# Patient Record
Sex: Male | Born: 1935 | Race: White | Hispanic: No | Marital: Married | Smoking: Never smoker
Health system: Southern US, Community
[De-identification: ages and names within clinical notes are randomized; demographics above are authoritative.]

## PROBLEM LIST (undated history)

## (undated) DIAGNOSIS — E785 Hyperlipidemia, unspecified: Secondary | ICD-10-CM

## (undated) DIAGNOSIS — R600 Localized edema: Secondary | ICD-10-CM

## (undated) DIAGNOSIS — C61 Malignant neoplasm of prostate: Secondary | ICD-10-CM

## (undated) DIAGNOSIS — R32 Unspecified urinary incontinence: Secondary | ICD-10-CM

## (undated) DIAGNOSIS — Z85828 Personal history of other malignant neoplasm of skin: Secondary | ICD-10-CM

## (undated) DIAGNOSIS — R0600 Dyspnea, unspecified: Secondary | ICD-10-CM

## (undated) DIAGNOSIS — Z8719 Personal history of other diseases of the digestive system: Secondary | ICD-10-CM

## (undated) DIAGNOSIS — N210 Calculus in bladder: Secondary | ICD-10-CM

## (undated) DIAGNOSIS — E119 Type 2 diabetes mellitus without complications: Secondary | ICD-10-CM

## (undated) DIAGNOSIS — I499 Cardiac arrhythmia, unspecified: Secondary | ICD-10-CM

## (undated) DIAGNOSIS — N400 Enlarged prostate without lower urinary tract symptoms: Secondary | ICD-10-CM

## (undated) DIAGNOSIS — Z87442 Personal history of urinary calculi: Secondary | ICD-10-CM

## (undated) DIAGNOSIS — I2510 Atherosclerotic heart disease of native coronary artery without angina pectoris: Secondary | ICD-10-CM

## (undated) DIAGNOSIS — Z9889 Other specified postprocedural states: Secondary | ICD-10-CM

## (undated) DIAGNOSIS — I252 Old myocardial infarction: Secondary | ICD-10-CM

## (undated) DIAGNOSIS — D649 Anemia, unspecified: Secondary | ICD-10-CM

## (undated) DIAGNOSIS — M51369 Other intervertebral disc degeneration, lumbar region without mention of lumbar back pain or lower extremity pain: Secondary | ICD-10-CM

## (undated) DIAGNOSIS — M5136 Other intervertebral disc degeneration, lumbar region: Secondary | ICD-10-CM

## (undated) DIAGNOSIS — K219 Gastro-esophageal reflux disease without esophagitis: Secondary | ICD-10-CM

## (undated) DIAGNOSIS — Z860101 Personal history of adenomatous and serrated colon polyps: Secondary | ICD-10-CM

## (undated) DIAGNOSIS — I10 Essential (primary) hypertension: Secondary | ICD-10-CM

## (undated) DIAGNOSIS — Z86010 Personal history of colonic polyps: Secondary | ICD-10-CM

## (undated) DIAGNOSIS — N189 Chronic kidney disease, unspecified: Secondary | ICD-10-CM

## (undated) DIAGNOSIS — I480 Paroxysmal atrial fibrillation: Secondary | ICD-10-CM

## (undated) DIAGNOSIS — I5032 Chronic diastolic (congestive) heart failure: Secondary | ICD-10-CM

## (undated) DIAGNOSIS — N35919 Unspecified urethral stricture, male, unspecified site: Secondary | ICD-10-CM

## (undated) DIAGNOSIS — Z85038 Personal history of other malignant neoplasm of large intestine: Secondary | ICD-10-CM

## (undated) HISTORY — PX: BLEPHAROPLASTY: SUR158

## (undated) HISTORY — PX: CORONARY ANGIOPLASTY: SHX604

---

## 2003-10-11 ENCOUNTER — Ambulatory Visit: Payer: Self-pay | Admitting: Unknown Physician Specialty

## 2003-10-14 ENCOUNTER — Ambulatory Visit: Payer: Self-pay | Admitting: Unknown Physician Specialty

## 2004-03-14 ENCOUNTER — Emergency Department: Payer: Self-pay | Admitting: Emergency Medicine

## 2004-09-02 ENCOUNTER — Ambulatory Visit: Payer: Self-pay | Admitting: Internal Medicine

## 2004-09-08 ENCOUNTER — Ambulatory Visit: Payer: Self-pay | Admitting: Internal Medicine

## 2006-09-04 IMAGING — RF DG BE W/ AIR HIGH DENSITY
1 series · 1 of 1 positions shown · non-contrast
Comparison: none

REASON FOR EXAM: Incomplete colonscopy [DATE]
COMMENTS:

[Series 1: run · 1 of 1 slices shown]
[im 1/1]
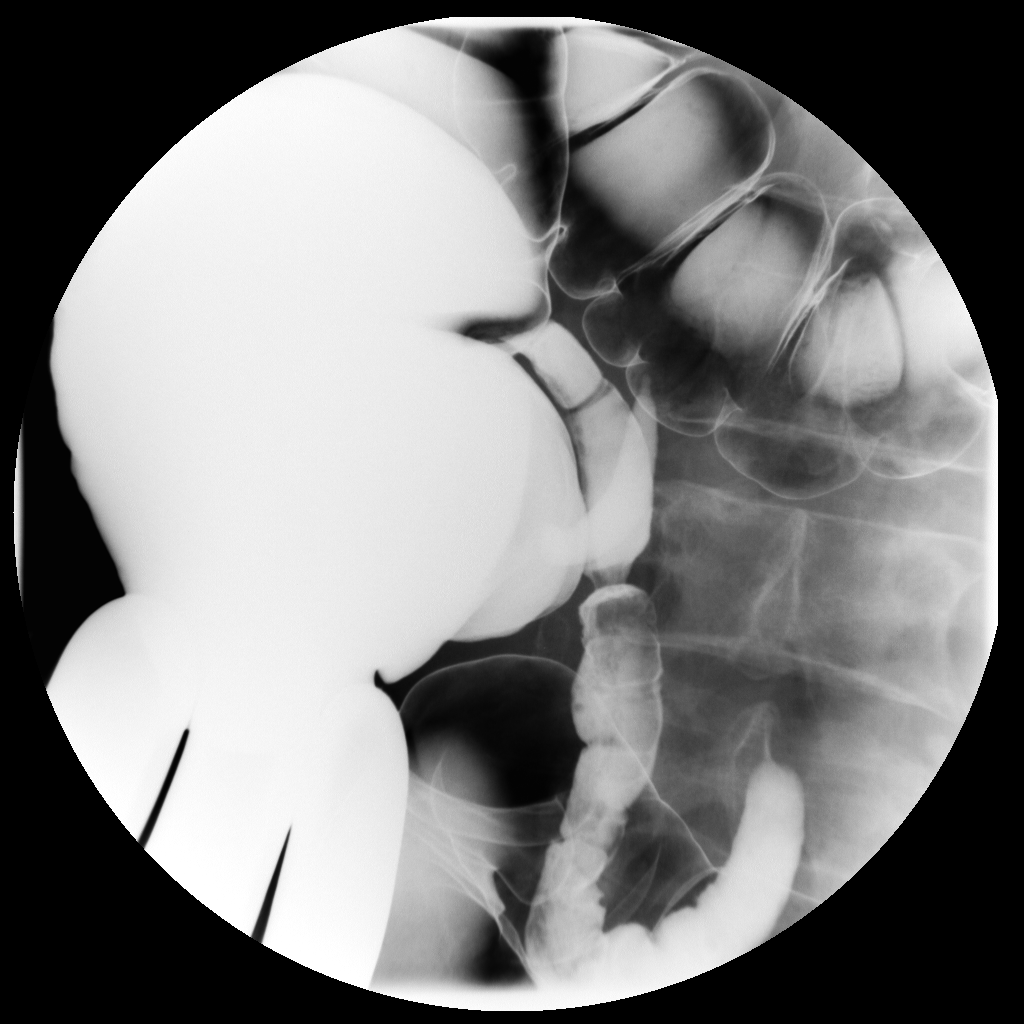

[1 of 1 positions shown; findings below may reference images not displayed]

PROCEDURE:     FL  - FL BARIUM ENEMA WITH AIR COLON  - October 14, 2003 [DATE]

RESULT:     Barium and air were introduced in a retrograde fashion and
flowed freely from rectum to cecum.  No constricting lesions are identified.
 No diverticula are noted.  There is some small amount of retained fecal
content in the transverse colon.

Barium filled the appendix and barium and air refluxed into the terminal
ileum, which appears within normal limits.
IMPRESSION: No significant abnormalities noted on the air contrast barium enema.

## 2006-10-17 ENCOUNTER — Ambulatory Visit: Payer: Self-pay | Admitting: Unknown Physician Specialty

## 2007-01-13 ENCOUNTER — Ambulatory Visit: Payer: Self-pay | Admitting: Unknown Physician Specialty

## 2010-09-28 ENCOUNTER — Ambulatory Visit: Payer: Self-pay | Admitting: Unknown Physician Specialty

## 2011-08-18 ENCOUNTER — Ambulatory Visit: Payer: Self-pay | Admitting: Internal Medicine

## 2011-08-25 ENCOUNTER — Ambulatory Visit: Payer: Self-pay | Admitting: Urology

## 2011-08-26 ENCOUNTER — Inpatient Hospital Stay: Payer: Self-pay | Admitting: Urology

## 2011-08-27 LAB — BASIC METABOLIC PANEL
Anion Gap: 10 (ref 7–16)
Calcium, Total: 8.7 mg/dL (ref 8.5–10.1)
Chloride: 104 mmol/L (ref 98–107)
Co2: 26 mmol/L (ref 21–32)
EGFR (Non-African Amer.): 33 — ABNORMAL LOW
Osmolality: 288 (ref 275–301)
Potassium: 4.6 mmol/L (ref 3.5–5.1)

## 2011-08-27 LAB — PLATELET COUNT: Platelet: 250 10*3/uL (ref 150–440)

## 2011-08-27 LAB — PROTIME-INR: Prothrombin Time: 14.3 secs (ref 11.5–14.7)

## 2011-08-27 LAB — APTT: Activated PTT: 33.9 secs (ref 23.6–35.9)

## 2012-04-13 ENCOUNTER — Ambulatory Visit: Payer: Self-pay | Admitting: Physical Medicine and Rehabilitation

## 2012-09-25 ENCOUNTER — Ambulatory Visit: Payer: Self-pay | Admitting: Internal Medicine

## 2012-11-02 ENCOUNTER — Ambulatory Visit: Payer: Self-pay | Admitting: Unknown Physician Specialty

## 2012-11-08 HISTORY — PX: PARTIAL KNEE ARTHROPLASTY: SHX2174

## 2013-08-04 HISTORY — PX: CYSTOSCOPY WITH RETROGRADE PYELOGRAM, URETEROSCOPY AND STENT PLACEMENT: SHX5789

## 2013-09-18 ENCOUNTER — Ambulatory Visit: Payer: Self-pay

## 2013-10-22 HISTORY — PX: TRANSTHORACIC ECHOCARDIOGRAM: SHX275

## 2013-12-21 ENCOUNTER — Ambulatory Visit: Payer: Self-pay | Admitting: Unknown Physician Specialty

## 2014-04-23 NOTE — Op Note (Signed)
PATIENT NAME:  Carlos Obrien, Carlos Obrien MR#:  [id number] DATE OF BIRTH:  06-23-1934  DATE OF PROCEDURE:  08/26/2011  PREOPERATIVE DIAGNOSIS: Left distal ureteral calculus.   POSTOPERATIVE DIAGNOSIS: Left distal ureteral calculus.  PROCEDURE: Cystoscopy.   SURGEON: John Giovanni, M.D.   ASSISTANT: None.   ANESTHESIA: General.   INDICATIONS: This is a 80 year old male who presented last week with a three-day history of left renal colic. Prior emergency department visit was remarkable for stone protocol CT scan showing a 2 mm left distal ureteral calculus with mild to moderate hydronephrosis/hydroureter. He has been on tamsulosin and pain medication and has been unable to pass the stone. He took a laxative yesterday with good results and has been asymptomatic since that time but not aware of passing a stone. They elected to proceed with ureteroscopy.   DESCRIPTION OF PROCEDURE: The patient was taken to the cystoscopy room where a general anesthetic was administered. He was placed in the low lithotomy position and his external genitalia were prepped and draped in the usual fashion. Time out was performed per protocol. A 21 French cystoscope with 30 degree lens was lubricated and passed under direct vision. There was a wide caliber bulb stricture present which the 21 [place] scope was able to negotiate. The left hemi-trigone was remarkable for marked edema and a definite left ureteral orifice was not visualized. The right ureteral orifice was normal in appearance with clear efflux. No bladder mucosal lesions were identified. There was mild prostatic enlargement present. A 5 French open-ended ureteral catheter was placed in the working channel of the cystoscope. A 0.035 Glidewire was placed through the catheter. On closely inspecting the left hemi-trigone several areas were probed with a wire, however, the ureteral orifice could not be located. The orifice was also examined with a 70 degree lens. No efflux was seen.  He was given 1 amp of indigo carmine. There was marked effluxing from the right orifice, but no efflux was seen from the left side with indigo carmine. The cystoscope was removed. A 6 French semirigid ureteroscope was passed under direct vision. Areas of the left hemi-trigone were again inspected with the ureteroscope and probed with a wire, although no definite orifice could be located. Greater than 60 minutes was spent attempting to locate the orifice and at that point it was elected to abandon the procedure. The bladder was emptied and the cystoscope was removed. The case was discussed with interventional radiology and he is slated to have a percutaneous nephrostomy placed in the morning.  ____________________________ Ronda Fairly. Bernardo Heater, MD scs:slb D: 08/26/2011 11:04:38 ET T: 08/26/2011 11:37:17 ET JOB#: [id number]  cc: Nicki Reaper C. Bernardo Heater, MD, <Dictator> Abbie Sons MD ELECTRONICALLY SIGNED 08/30/2011 21:16

## 2014-04-29 LAB — SURGICAL PATHOLOGY

## 2014-10-02 ENCOUNTER — Ambulatory Visit (INDEPENDENT_AMBULATORY_CARE_PROVIDER_SITE_OTHER): Payer: Medicare Other

## 2014-10-02 ENCOUNTER — Ambulatory Visit (INDEPENDENT_AMBULATORY_CARE_PROVIDER_SITE_OTHER): Payer: Medicare Other | Admitting: Podiatry

## 2014-10-02 VITALS — BP 120/75 | HR 93 | Resp 16

## 2014-10-02 DIAGNOSIS — M722 Plantar fascial fibromatosis: Secondary | ICD-10-CM

## 2014-10-02 MED ORDER — TRIAMCINOLONE ACETONIDE 10 MG/ML IJ SUSP
10.0000 mg | Freq: Once | INTRAMUSCULAR | Status: AC
  Administered 2014-10-02: 10 mg

## 2014-10-02 NOTE — Progress Notes (Signed)
Subjective:     Patient ID: Carlos Obrien, male   DOB: 09/02/1934, 80 y.o.   MRN: [id number]  HPI patient states I've had a lot of pain on the bottom of my left heel and it just started occurring several weeks ago and I had problems with my right heel years ago   Review of Systems  All other systems reviewed and are negative.      Objective:   Physical Exam  Constitutional: He is oriented to person, place, and time.  Cardiovascular: Intact distal pulses.   Musculoskeletal: Normal range of motion.  Neurological: He is oriented to person, place, and time.  Skin: Skin is warm and dry.  Nursing note and vitals reviewed.  neurovascular status intact muscle strength adequate range of motion within normal limits with patient having exquisite discomfort at the insertion of the plantar fascia into the left calcaneus. Moderate depression of the arch is noted and patient is noted to be well oriented 3 and has good digital perfusion     Assessment:     Plantar fasciitis left acute in nature    Plan:     H&P and x-ray reviewed with patient. Injected the plantar fascial left 3 mg Kenalog 5 mg Xylocaine and dispensed fascial brace with instructions on usage. Reappoint to recheck in 2 weeks

## 2014-10-02 NOTE — Progress Notes (Signed)
   Subjective:    Patient ID: Carlos Obrien, male    DOB: July 16, 1934, 80 y.o.   MRN: [id number]  HPI Pt  Presents with pain on the bottom of his left heel lasting 2 weeks and worsening. No therapies done at this time   Review of Systems  All other systems reviewed and are negative.      Objective:   Physical Exam        Assessment & Plan:

## 2014-10-02 NOTE — Patient Instructions (Signed)

## 2014-10-09 ENCOUNTER — Ambulatory Visit (INDEPENDENT_AMBULATORY_CARE_PROVIDER_SITE_OTHER): Payer: Medicare Other | Admitting: Podiatry

## 2014-10-09 ENCOUNTER — Ambulatory Visit: Payer: Medicare Other | Admitting: Podiatry

## 2014-10-09 DIAGNOSIS — M722 Plantar fascial fibromatosis: Secondary | ICD-10-CM | POA: Diagnosis not present

## 2014-10-09 NOTE — Progress Notes (Signed)
Subjective:     Patient ID: Carlos Obrien, male   DOB: 17-Nov-1934, 80 y.o.   MRN: [id number]  HPI patient presents stating my heel is improving but still sore and I just wanted to make sure that I'm able to resume walking   Review of Systems     Objective:   Physical Exam Neurovascular status intact muscle strength adequate with diminished discomfort plantar aspect left heel insertional point tendon into the calcaneus    Assessment:     Plantar fasciitis left improving but still present upon deep palpation with moderate edema    Plan:     Reviewed at great length treatment options including continued injection therapy anti-inflammatory physical therapy and supportive shoe gear. Patient will be seen back for [place] to recheck

## 2014-10-16 ENCOUNTER — Encounter: Payer: Self-pay | Admitting: Podiatry

## 2014-10-16 ENCOUNTER — Ambulatory Visit (INDEPENDENT_AMBULATORY_CARE_PROVIDER_SITE_OTHER): Payer: Medicare Other | Admitting: Podiatry

## 2014-10-16 VITALS — BP 123/74 | HR 103 | Resp 18

## 2014-10-16 DIAGNOSIS — M722 Plantar fascial fibromatosis: Secondary | ICD-10-CM | POA: Diagnosis not present

## 2014-10-16 NOTE — Progress Notes (Signed)
He presents today with worsening foot pain to his left heel. He states that he saw Dr. in our office in [place] who gave him an injection to his left heel. He is supposed to follow-up next week. He states this seems to be getting worse rather than better.  Objective: Vital signs are stable he is alert and oriented 3. Pulses are palpable. Pain on palpation medially for interval of the left hip.  Assessment: Plantar fasciitis left heel.  Plan: Reinjected with 20 mg of Kenalog left heel. Placed him in a plantar fascial brace and night splint encouraged him to stay in his tissues throughout the day and I will follow-up with him in 1 month.  Roselind Messier DPM

## 2014-10-21 ENCOUNTER — Ambulatory Visit: Payer: Medicare Other | Admitting: Podiatry

## 2014-10-22 ENCOUNTER — Telehealth: Payer: Self-pay | Admitting: *Deleted

## 2014-10-22 NOTE — Telephone Encounter (Signed)
Pt's wife, Shauna Hugh states pt puts on night splint at night when in bed with the ice pack, once the ice pack melts pt takes the night splint off.  I instructed Diane, the night splint was to be worn with the ice packs when pt was resting, 3-4 times a day for 10-15 minutes when seated, and to wear the night splint without the ice packs when sleeping.  Pt must have the foot protected from the ice with a fabric barrier. I contacted the pt for status up date on the left heel pain.  Pt states he still has pain.  I explained the wearing of the ice pack and night splint during the day and the night splint at night to bed, and how the ice worked on the inflammation, swelling and pain and how the night splint at bed time helped to train the plantar fascia to stay stretched out and more flexible.  Pt agreed.

## 2014-10-24 ENCOUNTER — Telehealth: Payer: Self-pay | Admitting: Podiatry

## 2014-10-24 ENCOUNTER — Telehealth: Payer: Self-pay

## 2014-10-24 NOTE — Telephone Encounter (Signed)
DR. Milinda Pointer PT. CAME IN [place] OFFICE TO PICK UP AIR FRACTURE WALKER FOR THE LT FOOT. 10-24-2014

## 2014-10-24 NOTE — Telephone Encounter (Signed)
Pt wife, Shauna Hugh called stating he was in severe pain when trying to walk on his right foot. Per Dr Milinda Pointer, pt can try using air fracture walker to offload foot and allow to rest. Advised to continue with ice, rest and elevation. Keep f/u appt

## 2014-10-25 ENCOUNTER — Ambulatory Visit: Payer: Medicare Other | Admitting: Sports Medicine

## 2014-11-13 ENCOUNTER — Encounter: Payer: Self-pay | Admitting: Podiatry

## 2014-11-13 ENCOUNTER — Ambulatory Visit (INDEPENDENT_AMBULATORY_CARE_PROVIDER_SITE_OTHER): Payer: Medicare Other | Admitting: Podiatry

## 2014-11-13 VITALS — BP 124/82 | HR 91 | Resp 18

## 2014-11-13 DIAGNOSIS — M722 Plantar fascial fibromatosis: Secondary | ICD-10-CM

## 2014-11-13 NOTE — Progress Notes (Signed)
He presents today for follow-up of his plantar fasciitis. He states that his left heel is doing much better but it is not well yet. He states that he has started wearing a pair of his ulnar orthotics which makes that he'll feel much better.  Objective: Vital signs are stable alert and oriented 3. Pulses are strongly palpable. He has pain on palpation medial calcaneal tubercle of the left heel but much decreased from previous evaluation. His orthotics are from 1989. They're still in good shape and there not broken down. It appeared to fit his arch and his rear foot remarkably well.  Assessment: Plantar fasciitis left heel. Doing much better.  Plan: At this point I injected the first dose of dehydrated alcohol to his left heel. And I urged him to continue the use of his orthotics and shoes. He will continue all other conservative therapies night splints for plantar fascial braces I will follow-up with him in 1 month.

## 2014-12-17 ENCOUNTER — Other Ambulatory Visit: Payer: Self-pay | Admitting: Urology

## 2014-12-25 ENCOUNTER — Ambulatory Visit: Payer: Medicare Other | Admitting: Podiatry

## 2015-01-01 ENCOUNTER — Encounter (HOSPITAL_BASED_OUTPATIENT_CLINIC_OR_DEPARTMENT_OTHER): Payer: Self-pay | Admitting: *Deleted

## 2015-01-01 NOTE — Progress Notes (Signed)
NPO AFTER MN.  ARRIVE AT [id number].  NEEDS ISTAT AND EKG.  WILL TAKE IMDUR, ATENOLOL, AND PRILOSEC AM DOS W/ SIPS OF WATER.

## 2015-01-02 ENCOUNTER — Encounter (HOSPITAL_BASED_OUTPATIENT_CLINIC_OR_DEPARTMENT_OTHER): Payer: Self-pay | Admitting: *Deleted

## 2015-01-08 ENCOUNTER — Encounter (HOSPITAL_BASED_OUTPATIENT_CLINIC_OR_DEPARTMENT_OTHER): Payer: Self-pay

## 2015-01-08 ENCOUNTER — Encounter (HOSPITAL_BASED_OUTPATIENT_CLINIC_OR_DEPARTMENT_OTHER)
Admission: RE | Disposition: A | Discharge status: Home / Self Care | Payer: Self-pay | Source: Ambulatory Visit | Attending: Urology

## 2015-01-08 ENCOUNTER — Ambulatory Visit (HOSPITAL_BASED_OUTPATIENT_CLINIC_OR_DEPARTMENT_OTHER)
Admission: RE | Admit: 2015-01-08 | Discharge: 2015-01-08 | Disposition: A | Discharge status: Home / Self Care | Payer: Medicare Other | Source: Ambulatory Visit | Attending: Urology | Admitting: Urology

## 2015-01-08 ENCOUNTER — Ambulatory Visit (HOSPITAL_BASED_OUTPATIENT_CLINIC_OR_DEPARTMENT_OTHER): Payer: Medicare Other | Admitting: Anesthesiology

## 2015-01-08 DIAGNOSIS — I252 Old myocardial infarction: Secondary | ICD-10-CM | POA: Insufficient documentation

## 2015-01-08 DIAGNOSIS — N35912 Unspecified bulbous urethral stricture, male: Secondary | ICD-10-CM

## 2015-01-08 DIAGNOSIS — Z79899 Other long term (current) drug therapy: Secondary | ICD-10-CM | POA: Diagnosis not present

## 2015-01-08 DIAGNOSIS — E119 Type 2 diabetes mellitus without complications: Secondary | ICD-10-CM | POA: Diagnosis not present

## 2015-01-08 DIAGNOSIS — I4891 Unspecified atrial fibrillation: Secondary | ICD-10-CM | POA: Insufficient documentation

## 2015-01-08 DIAGNOSIS — I509 Heart failure, unspecified: Secondary | ICD-10-CM | POA: Insufficient documentation

## 2015-01-08 DIAGNOSIS — Z7982 Long term (current) use of aspirin: Secondary | ICD-10-CM | POA: Diagnosis not present

## 2015-01-08 DIAGNOSIS — Z8546 Personal history of malignant neoplasm of prostate: Secondary | ICD-10-CM | POA: Diagnosis not present

## 2015-01-08 DIAGNOSIS — Z6834 Body mass index (BMI) 34.0-34.9, adult: Secondary | ICD-10-CM | POA: Diagnosis not present

## 2015-01-08 DIAGNOSIS — N3941 Urge incontinence: Secondary | ICD-10-CM | POA: Diagnosis not present

## 2015-01-08 DIAGNOSIS — Z7901 Long term (current) use of anticoagulants: Secondary | ICD-10-CM | POA: Insufficient documentation

## 2015-01-08 DIAGNOSIS — N359 Urethral stricture, unspecified: Secondary | ICD-10-CM | POA: Diagnosis not present

## 2015-01-08 DIAGNOSIS — K449 Diaphragmatic hernia without obstruction or gangrene: Secondary | ICD-10-CM | POA: Diagnosis not present

## 2015-01-08 DIAGNOSIS — Z7984 Long term (current) use of oral hypoglycemic drugs: Secondary | ICD-10-CM | POA: Insufficient documentation

## 2015-01-08 DIAGNOSIS — N3281 Overactive bladder: Secondary | ICD-10-CM | POA: Diagnosis not present

## 2015-01-08 DIAGNOSIS — M1990 Unspecified osteoarthritis, unspecified site: Secondary | ICD-10-CM | POA: Insufficient documentation

## 2015-01-08 DIAGNOSIS — K219 Gastro-esophageal reflux disease without esophagitis: Secondary | ICD-10-CM | POA: Diagnosis not present

## 2015-01-08 DIAGNOSIS — I110 Hypertensive heart disease with heart failure: Secondary | ICD-10-CM | POA: Insufficient documentation

## 2015-01-08 DIAGNOSIS — N210 Calculus in bladder: Secondary | ICD-10-CM

## 2015-01-08 DIAGNOSIS — Z791 Long term (current) use of non-steroidal anti-inflammatories (NSAID): Secondary | ICD-10-CM | POA: Diagnosis not present

## 2015-01-08 HISTORY — DX: Chronic diastolic (congestive) heart failure: I50.32

## 2015-01-08 HISTORY — DX: Other specified postprocedural states: Z85.828

## 2015-01-08 HISTORY — DX: Benign prostatic hyperplasia without lower urinary tract symptoms: N40.0

## 2015-01-08 HISTORY — DX: Other intervertebral disc degeneration, lumbar region without mention of lumbar back pain or lower extremity pain: M51.369

## 2015-01-08 HISTORY — PX: CYSTOSCOPY WITH URETHRAL DILATATION: SHX5125

## 2015-01-08 HISTORY — DX: Hyperlipidemia, unspecified: E78.5

## 2015-01-08 HISTORY — DX: Gastro-esophageal reflux disease without esophagitis: K21.9

## 2015-01-08 HISTORY — DX: Old myocardial infarction: I25.2

## 2015-01-08 HISTORY — DX: Essential (primary) hypertension: I10

## 2015-01-08 HISTORY — DX: Personal history of colonic polyps: Z86.010

## 2015-01-08 HISTORY — DX: Chronic kidney disease, unspecified: N18.9

## 2015-01-08 HISTORY — DX: Malignant neoplasm of prostate: C61

## 2015-01-08 HISTORY — PX: CYSTOSCOPY WITH HOLMIUM LASER LITHOTRIPSY: SHX6639

## 2015-01-08 HISTORY — DX: Calculus in bladder: N21.0

## 2015-01-08 HISTORY — DX: Personal history of other diseases of the digestive system: Z87.19

## 2015-01-08 HISTORY — DX: Atherosclerotic heart disease of native coronary artery without angina pectoris: I25.10

## 2015-01-08 HISTORY — DX: Other specified postprocedural states: Z98.890

## 2015-01-08 HISTORY — DX: Paroxysmal atrial fibrillation: I48.0

## 2015-01-08 HISTORY — DX: Personal history of other malignant neoplasm of large intestine: Z85.038

## 2015-01-08 HISTORY — DX: Personal history of adenomatous and serrated colon polyps: Z86.0101

## 2015-01-08 HISTORY — DX: Unspecified urinary incontinence: R32

## 2015-01-08 HISTORY — DX: Unspecified urethral stricture, male, unspecified site: N35.919

## 2015-01-08 HISTORY — DX: Localized edema: R60.0

## 2015-01-08 HISTORY — DX: Personal history of urinary calculi: Z87.442

## 2015-01-08 HISTORY — DX: Other intervertebral disc degeneration, lumbar region: M51.36

## 2015-01-08 LAB — GLUCOSE, CAPILLARY: Glucose-Capillary: 149 mg/dL — ABNORMAL HIGH (ref 65–99)

## 2015-01-08 LAB — POCT I-STAT 4, (NA,K, GLUC, HGB,HCT)
GLUCOSE: 189 mg/dL — AB (ref 65–99)
HCT: 41 % (ref 39.0–52.0)
HEMOGLOBIN: 13.9 g/dL (ref 13.0–17.0)
POTASSIUM: 4.6 mmol/L (ref 3.5–5.1)
Sodium: 137 mmol/L (ref 135–145)

## 2015-01-08 SURGERY — CYSTOSCOPY, WITH HOLMIUM LASER LITHOTRIPSY
Anesthesia: General | Site: Bladder

## 2015-01-08 MED ORDER — ONDANSETRON HCL 4 MG/2ML IJ SOLN
INTRAMUSCULAR | Status: DC | PRN
  Administered 2015-01-08: 4 mg via INTRAVENOUS

## 2015-01-08 MED ORDER — CEFAZOLIN SODIUM-DEXTROSE 2-3 GM-% IV SOLR
2.0000 g | INTRAVENOUS | Status: AC
  Administered 2015-01-08: 2 g via INTRAVENOUS
  Filled 2015-01-08: qty 50

## 2015-01-08 MED ORDER — OXYCODONE HCL 5 MG/5ML PO SOLN
5.0000 mg | Freq: Once | ORAL | Status: DC | PRN
  Filled 2015-01-08: qty 5

## 2015-01-08 MED ORDER — OXYCODONE HCL 5 MG PO TABS
5.0000 mg | ORAL_TABLET | Freq: Once | ORAL | Status: DC | PRN
  Filled 2015-01-08: qty 1

## 2015-01-08 MED ORDER — LACTATED RINGERS IV SOLN
INTRAVENOUS | Status: DC
  Filled 2015-01-08: qty 1000

## 2015-01-08 MED ORDER — FENTANYL CITRATE (PF) 100 MCG/2ML IJ SOLN
INTRAMUSCULAR | Status: DC | PRN
  Administered 2015-01-08 (×2): 50 ug via INTRAVENOUS

## 2015-01-08 MED ORDER — DEXAMETHASONE SODIUM PHOSPHATE 4 MG/ML IJ SOLN
INTRAMUSCULAR | Status: DC | PRN
  Administered 2015-01-08: 10 mg via INTRAVENOUS

## 2015-01-08 MED ORDER — LIDOCAINE HCL (CARDIAC) 20 MG/ML IV SOLN
INTRAVENOUS | Status: AC
  Filled 2015-01-08: qty 5

## 2015-01-08 MED ORDER — FENTANYL CITRATE (PF) 100 MCG/2ML IJ SOLN
INTRAMUSCULAR | Status: AC
  Filled 2015-01-08: qty 2

## 2015-01-08 MED ORDER — CEFAZOLIN SODIUM-DEXTROSE 2-3 GM-% IV SOLR
INTRAVENOUS | Status: AC
  Filled 2015-01-08: qty 50

## 2015-01-08 MED ORDER — MIDAZOLAM HCL 5 MG/5ML IJ SOLN
INTRAMUSCULAR | Status: DC | PRN
  Administered 2015-01-08: 1 mg via INTRAVENOUS

## 2015-01-08 MED ORDER — LIDOCAINE HCL (CARDIAC) 20 MG/ML IV SOLN
INTRAVENOUS | Status: DC | PRN
  Administered 2015-01-08: 100 mg via INTRAVENOUS

## 2015-01-08 MED ORDER — LIDOCAINE HCL 2 % EX GEL
CUTANEOUS | Status: DC | PRN
  Administered 2015-01-08: 1

## 2015-01-08 MED ORDER — HYDROMORPHONE HCL 1 MG/ML IJ SOLN
0.2500 mg | INTRAMUSCULAR | Status: DC | PRN
  Filled 2015-01-08: qty 1

## 2015-01-08 MED ORDER — DEXAMETHASONE SODIUM PHOSPHATE 10 MG/ML IJ SOLN
INTRAMUSCULAR | Status: AC
  Filled 2015-01-08: qty 1

## 2015-01-08 MED ORDER — PROPOFOL 10 MG/ML IV BOLUS
INTRAVENOUS | Status: AC
  Filled 2015-01-08: qty 20

## 2015-01-08 MED ORDER — ONDANSETRON HCL 4 MG/2ML IJ SOLN
INTRAMUSCULAR | Status: AC
  Filled 2015-01-08: qty 2

## 2015-01-08 MED ORDER — PROPOFOL 10 MG/ML IV BOLUS
INTRAVENOUS | Status: DC | PRN
  Administered 2015-01-08: 150 mg via INTRAVENOUS
  Administered 2015-01-08: 50 mg via INTRAVENOUS

## 2015-01-08 MED ORDER — MIDAZOLAM HCL 2 MG/2ML IJ SOLN
INTRAMUSCULAR | Status: AC
  Filled 2015-01-08: qty 2

## 2015-01-08 MED ORDER — MEPERIDINE HCL 25 MG/ML IJ SOLN
6.2500 mg | INTRAMUSCULAR | Status: DC | PRN
  Filled 2015-01-08: qty 1

## 2015-01-08 MED ORDER — SODIUM CHLORIDE 0.9 % IR SOLN
Status: DC | PRN
  Administered 2015-01-08: 3000 mL
  Administered 2015-01-08: 1000 mL

## 2015-01-08 MED ORDER — SODIUM CHLORIDE 0.9 % IV SOLN
INTRAVENOUS | Status: DC
  Administered 2015-01-08: 09:00:00 via INTRAVENOUS
  Filled 2015-01-08: qty 1000

## 2015-01-08 MED ORDER — KETOROLAC TROMETHAMINE 30 MG/ML IJ SOLN
INTRAMUSCULAR | Status: AC
  Filled 2015-01-08: qty 1

## 2015-01-08 MED ORDER — NALOXONE HCL 0.4 MG/ML IJ SOLN
INTRAMUSCULAR | Status: AC
  Filled 2015-01-08: qty 1

## 2015-01-08 MED ORDER — IOHEXOL 350 MG/ML SOLN
INTRAVENOUS | Status: DC | PRN
  Administered 2015-01-08: 10 mL via URETHRAL

## 2015-01-08 SURGICAL SUPPLY — 43 items
BAG DRAIN URO-CYSTO SKYTR STRL (DRAIN) ×4 IMPLANT
BAG URINE DRAINAGE (UROLOGICAL SUPPLIES) IMPLANT
BAG URINE LEG 19OZ MD ST LTX (BAG) IMPLANT
BAG URINE LEG 500ML (DRAIN) IMPLANT
BALLN NEPHROSTOMY (BALLOONS) ×4
BALLOON NEPHROSTOMY (BALLOONS) ×2 IMPLANT
CATH FOLEY 2WAY SLVR  5CC 16FR (CATHETERS)
CATH FOLEY 2WAY SLVR  5CC 18FR (CATHETERS)
CATH FOLEY 2WAY SLVR  5CC 20FR (CATHETERS)
CATH FOLEY 2WAY SLVR  5CC 22FR (CATHETERS)
CATH FOLEY 2WAY SLVR 30CC 22FR (CATHETERS) IMPLANT
CATH FOLEY 2WAY SLVR 5CC 16FR (CATHETERS) IMPLANT
CATH FOLEY 2WAY SLVR 5CC 18FR (CATHETERS) IMPLANT
CATH FOLEY 2WAY SLVR 5CC 20FR (CATHETERS) IMPLANT
CATH FOLEY 2WAY SLVR 5CC 22FR (CATHETERS) IMPLANT
CLOTH BEACON ORANGE TIMEOUT ST (SAFETY) ×4 IMPLANT
ELECT BUTTON BIOP 24F 90D PLAS (MISCELLANEOUS) IMPLANT
ELECT REM PT RETURN 9FT ADLT (ELECTROSURGICAL) ×4
ELECTRODE REM PT RTRN 9FT ADLT (ELECTROSURGICAL) ×2 IMPLANT
EVACUATOR MICROVAS BLADDER (UROLOGICAL SUPPLIES) IMPLANT
GLOVE BIO SURGEON STRL SZ 6.5 (GLOVE) ×3 IMPLANT
GLOVE BIO SURGEON STRL SZ7.5 (GLOVE) ×4 IMPLANT
GLOVE BIO SURGEONS STRL SZ 6.5 (GLOVE) ×1
GLOVE INDICATOR 6.5 STRL GRN (GLOVE) ×8 IMPLANT
GOWN STRL REUS W/ TWL LRG LVL3 (GOWN DISPOSABLE) ×2 IMPLANT
GOWN STRL REUS W/ TWL XL LVL3 (GOWN DISPOSABLE) ×2 IMPLANT
GOWN STRL REUS W/TWL LRG LVL3 (GOWN DISPOSABLE) ×2
GOWN STRL REUS W/TWL XL LVL3 (GOWN DISPOSABLE) ×2
GUIDEWIRE STR DUAL SENSOR (WIRE) ×4 IMPLANT
HOLDER FOLEY CATH W/STRAP (MISCELLANEOUS) IMPLANT
KIT ROOM TURNOVER WOR (KITS) ×4 IMPLANT
MANIFOLD NEPTUNE II (INSTRUMENTS) ×4 IMPLANT
NDL SAFETY ECLIPSE 18X1.5 (NEEDLE) IMPLANT
NEEDLE HYPO 18GX1.5 SHARP (NEEDLE)
NEEDLE HYPO 22GX1.5 SAFETY (NEEDLE) IMPLANT
NS IRRIG 500ML POUR BTL (IV SOLUTION) IMPLANT
PACK CYSTO (CUSTOM PROCEDURE TRAY) ×4 IMPLANT
PLUG CATH AND CAP STER (CATHETERS) IMPLANT
SYR 20CC LL (SYRINGE) IMPLANT
SYR 30ML LL (SYRINGE) IMPLANT
TUBE CONNECTING 12'X1/4 (SUCTIONS) ×1
TUBE CONNECTING 12X1/4 (SUCTIONS) ×3 IMPLANT
WATER STERILE IRR 3000ML UROMA (IV SOLUTION) IMPLANT

## 2015-01-08 NOTE — Anesthesia Preprocedure Evaluation (Addendum)
Anesthesia Evaluation  Patient identified by MRN, date of birth, ID band Patient awake    Reviewed: Allergy & Precautions, NPO status , Patient's Chart, lab work & pertinent test results, reviewed documented beta blocker date and time   Airway Mallampati: II  TM Distance: >3 FB Neck ROM: Full    Dental  (+) Teeth Intact, Dental Advisory Given   Pulmonary    breath sounds clear to auscultation       Cardiovascular hypertension, Pt. on medications and Pt. on home beta blockers + CAD and +CHF   Rhythm:Regular Rate:Normal     Neuro/Psych    GI/Hepatic hiatal hernia, GERD  Medicated and Controlled,  Endo/Other  Morbid obesity  Renal/GU      Musculoskeletal  (+) Arthritis ,   Abdominal   Peds  Hematology   Anesthesia Other Findings Pt took his Victoza this AM.  Glucose is 189. Will monitor level and his tolerance of PO intake before he leaves the facility.  Reproductive/Obstetrics                           Anesthesia Physical Anesthesia Plan  ASA: III  Anesthesia Plan: General   Post-op Pain Management:    Induction: Intravenous  Airway Management Planned: LMA  Additional Equipment:   Intra-op Plan:   Post-operative Plan: Extubation in OR  Informed Consent: I have reviewed the patients History and Physical, chart, labs and discussed the procedure including the risks, benefits and alternatives for the proposed anesthesia with the patient or authorized representative who has indicated his/her understanding and acceptance.   Dental advisory given  Plan Discussed with: CRNA, Anesthesiologist and Surgeon  Anesthesia Plan Comments:         Anesthesia Quick Evaluation

## 2015-01-08 NOTE — Anesthesia Procedure Notes (Signed)
Procedure Name: LMA Insertion Date/Time: 01/08/2015 9:48 AM Performed by: Wanita Chamberlain Pre-anesthesia Checklist: Patient identified, Timeout performed, Emergency Drugs available, Suction available and Patient being monitored Patient Re-evaluated:Patient Re-evaluated prior to inductionOxygen Delivery Method: Circle system utilized Preoxygenation: Pre-oxygenation with 100% oxygen Intubation Type: IV induction Ventilation: Mask ventilation without difficulty LMA: LMA inserted LMA Size: 4.0 Number of attempts: 1 Placement Confirmation: breath sounds checked- equal and bilateral and positive ETCO2 Tube secured with: Tape Dental Injury: Teeth and Oropharynx as per pre-operative assessment

## 2015-01-08 NOTE — Op Note (Signed)
Preoperative diagnosis: history of brachytherapy, urethral stricture disease, urethral/bladder calculus Postoperative diagnosis:same  Procedure:cystoscopy, balloon dilation of urethral stricture, basketing of bladder calculus   Surgeon: Bernestine Amass M.D.  Anesthesia: Gen.  Indications:Mr. Carlos Obrien is 81 years of age. He has had some very long-standing and problematic obstructive and irritative voiding symptoms. He is been tried on empiric medication without success. Recent evaluation in the office revealed a significant bulbar urethral stricture with what appeared to be a calcification proximal to the stricture. He presents now for further assessment and definitive treatment.     Technique and findings:patient was brought to the operating room where he had successful induction of general anesthesia. He was placed in lithotomy position prepped and draped in usual manner. Cystoscopically again we encountered a tight bulbar urethral stricture. Visually a guidewire was placed in the stricture. Fluoroscopically the guidewire was within the bladder. We used a 24 French fascial dilating balloon to 15 atm of pressure to dilate this stricture. The balloon was subsequently removed. Cystoscopically the strictured opened up nicely. It did not appear to be any longer than 5-6 mm. The prostate itself was small and did not show any obvious visual obstruction. Within the bladder a 6-7 mm stone which was encountered. This was basket extracted. A Foley catheter was inserted. The patient was brought to recovery room stable condition having had no obvious  Complications or problems.

## 2015-01-08 NOTE — Transfer of Care (Signed)
Immediate Anesthesia Transfer of Care Note  Patient: Carlos Obrien  Procedure(s) Performed: Procedure(s): CYSTOSCOPY WITH REMOVAL OF BLADDER STONE (N/A) CYSTOSCOPY WITH URETHRAL DILATATION (N/A)  Patient Location: PACU  Anesthesia Type:General  Level of Consciousness: awake, alert , oriented and patient cooperative  Airway & Oxygen Therapy: Patient Spontanous Breathing and Patient connected to nasal cannula oxygen  Post-op Assessment: Report given to RN and Post -op Vital signs reviewed and stable  Post vital signs: Reviewed and stable  Last Vitals:  Filed Vitals:   01/08/15 0830  BP: 144/92  Pulse: 78  Temp: 36.8 C  Resp: 18    Complications: No apparent anesthesia complications

## 2015-01-08 NOTE — H&P (Signed)
History of Present Illness   [name] presents today along with his wife. Again, he has had some longstanding and really problematic issues with obstructive and irritative voiding symptoms. He has had really problematic issues with nocturia and severe incontinence at night. He has also had urgency and is wearing pads on a regular basis. This is a bit physically and mentally difficult for him. We have tried a variety of different medications empirically but really have not been able to solve the problem. He does have a previous history of prostate cancer and has had a seed implantation. He does have a number of medical comorbidities. He does have diabetes mellitus. He also has some musculoskeletal back issues. He is on a diuretic. He is also on Eliquis for atrial fibrillation. He did undergo urodynamics which did show bladder instability. There was some equivocal obstruction but we thought we ought to manage him as conservatively as possible. Things have not gotten better but have absolutely worsened. He has generally been found to empty his bladder fairly well but postvoid residual today was over 200 mL. His urinalysis, however, is clear. He is here for a cystoscopic assessment. We have him on an alpha blocker. We have tried him on a variety of medications for bladder overactivity but he is currently not on any of those at this point.      Past Medical History Problems  1. History of arthritis (Z87.39) 2. History of diabetes mellitus (Z86.39) 3. History of heartburn (Z87.898) 4. History of myocardial infarction (I25.2)  Surgical History Problems  1. History of Knee Surgery  Current Meds 1. Aspirin 325 MG Oral Tablet;  Therapy: (Recorded:24Mar2015) to Recorded 2. Atenolol 25 MG Oral Tablet;  Therapy: (Recorded:07Jul2016) to Recorded 3. Atorvastatin Calcium 20 MG Oral Tablet;  Therapy: (Recorded:24Mar2015) to Recorded 4. CeleBREX 200 MG Oral Capsule; 2 qd;  Therapy: (Recorded:07Jul2016) to  Recorded 5. Eliquis 5 MG Oral Tablet; 2 qd;  Therapy: (Recorded:07Jul2016) to Recorded 6. Furosemide 40 MG Oral Tablet;  Therapy: (Recorded:07Jul2016) to Recorded 7. GlipiZIDE XL TB24; 20mg  2 qam before breakfast;  Therapy: (Recorded:07Jul2016) to Recorded 8. Imdur 30 MG TB24;  Therapy: (Recorded:24Mar2015) to Recorded 9. Magnesium Oxide 400 MG Oral Capsule;  Therapy: (Recorded:07Jul2016) to Recorded 10. Myrbetriq 50 MG Oral Tablet Extended Release 24 Hour; Take 1 tablet daily;   Therapy: 20Apr2015 to (Evaluate:04Apr2017)  Requested for: [id number]; Last   Rx:09May2016 Ordered 11. Nitrostat 0.4 MG Sublingual Tablet Sublingual;   Therapy: (Recorded:07Jul2016) to Recorded 12. Omeprazole 40 MG Oral Capsule Delayed Release;   Therapy: (Recorded:07Jul2016) to Recorded 13. Pioglitazone HCl - 30 MG Oral Tablet;   Therapy: (Recorded:24Mar2015) to Recorded 14. Ramipril 2.5 MG Oral Capsule;   Therapy: (Recorded:24Mar2015) to Recorded 15. Rapaflo 8 MG Oral Capsule; TAKE 1 CAPSULE Daily;   Therapy: 20Apr2015 to (Evaluate:18Jan2017)  Requested for: 20Sep2016; Last   Rx:20Sep2016; Status: ACTIVE - Retrospective Authorization Ordered 16. Tylenol TABS; Take 1 tablet as needed;   Therapy: (Recorded:07Jul2016) to Recorded 17. Victoza 18 MG/3ML Subcutaneous Solution Pen-injector;   Therapy: (Recorded:07Jul2016) to Recorded  Allergies Medication  1. No Known Drug Allergies  Family History Problems  1. Family history of cardiac disorder (Z82.49) : Father, Sister 2. Family history of diabetes mellitus (Z83.3) : Father, Sister 3. Family history of pulmonary valve stenosis (Z82.49) : Mother  Social History Problems  1. Denied: History of Alcohol use 2. Caffeine use (F15.90)   3-4 qd 3. Father deceased 43. Married 5. Mother deceased 19. Never smoker 7. Denied: History of  Number of children 8. Retired  Review of Systems Genitourinary, constitutional, skin, eye, otolaryngeal,  hematologic/lymphatic, cardiovascular, pulmonary, endocrine, musculoskeletal, gastrointestinal, neurological and psychiatric system(s) were reviewed and pertinent findings if present are noted and are otherwise negative.  Genitourinary: urinary frequency, feelings of urinary urgency, nocturia, incontinence, weak urinary stream and incomplete emptying of bladder, but no dysuria, urinary stream does not start and stop and no hematuria.  Gastrointestinal: diarrhea.  Hematologic/Lymphatic: a tendency to easily bruise.  Respiratory: shortness of breath.  Musculoskeletal: back pain and joint pain.  Psychiatric: depression.    Vitals Vital Signs [Data Includes: Last 1 Day]  Recorded: [id number] 09:06AM  Blood Pressure: 136 / 85 Heart Rate: 87  Physical Exam Constitutional: Well nourished and well developed . No acute distress.  ENT:. The ears and nose are normal in appearance.  Neck: The appearance of the neck is normal and no neck mass is present.  Pulmonary: No respiratory distress and normal respiratory rhythm and effort.  Cardiovascular: Heart rate and rhythm are normal . No peripheral edema.  Abdomen: The abdomen is soft and nontender. No masses are palpated. No CVA tenderness. No hernias are palpable. No hepatosplenomegaly noted.  Rectal: Rectal exam demonstrates normal sphincter tone, no tenderness and no masses. Prostate size is estimated to be 10 g. Normal rectal tone, no rectal masses, prostate is smooth, symmetric and non-tender. The prostate has no nodularity and is not tender. The left seminal vesicle is nonpalpable. The right seminal vesicle is nonpalpable. The perineum is normal on inspection.  Genitourinary: Examination of the penis demonstrates no discharge, no masses, no lesions and a normal meatus. The penis is uncircumcised. The scrotum is without lesions. The right epididymis is palpably normal and non-tender. The left epididymis is palpably normal and non-tender. The right testis  is non-tender and without masses. The left testis is non-tender and without masses.  Lymphatics: The femoral and inguinal nodes are not enlarged or tender.  Skin: Normal skin turgor, no visible rash and no visible skin lesions.  Neuro/Psych:. Mood and affect are appropriate.  Results/Data Urine [Data Includes: Last 1 Day]   [id number] COLOR YELLOW  APPEARANCE CLEAR  SPECIFIC GRAVITY 1.025  pH 5.0  GLUCOSE NEGATIVE  BILIRUBIN NEGATIVE  KETONE NEGATIVE  BLOOD NEGATIVE  PROTEIN NEGATIVE  NITRITE NEGATIVE  LEUKOCYTE ESTERASE NEGATIVE   PVR: Ultrasound PVR 233 ml.    Procedure  Procedure: Cystoscopy   Indication: Lower Urinary Tract Symptoms. Urethral Stricture.  Informed Consent: Risks, benefits, and potential adverse events were discussed and informed consent was obtained from the patient . Specific risks including, but not limited to bleeding, infection, pain, allergic reaction etc. were explained.  Prep: The patient was prepped with betadine.  Anesthesia:. Local anesthesia was administered intraurethrally with 2% lidocaine jelly.  Antibiotic prophylaxis: Ciprofloxacin.  Procedure Note:  Urethral meatus:. No abnormalities. Stones noted lodged.  Anterior urethra:. A severe stricture was present in the bulbar urethra but was not manipulated.  Bladder: Not able to visualize due to inability to pass beyond stones and stricture    Assessment Assessed  1. Urethral stricture (N35.9) 2. History of prostate cancer (Z85.46) 3. Urge incontinence of urine (N39.41)  Plan Health Maintenance  1. UA With REFLEX; [Do Not Release]; Status:Complete;   [id number] 08:55AM History of prostate cancer, Urge incontinence of urine, Urinary urgency  2. Follow-up Month x 6 Office  Follow-up  Status: Canceled Postprocedural bulbous urethral stricture, Urethral stricture, Urge incontinence of urine  3. Cysto; Status:Complete;   [id number]  Urge incontinence of urine  4. Follow-up Schedule  Surgery Office  Follow-up  Status: Complete  Done: [id number] Urinary urgency  5. PVR U/S; Status:Complete;   [id number]  Discussion/Summary   Mr Fambrough has had some pretty significant and progressive voiding symptoms. Again, he came to me initially thinking that a lot of this started after he had undergone a ureteroscopic procedure by his urologist. He clearly endoscopically now has evidence of a bulbar urethral stricture which has a number of adhering calcifications. Whether he has had some bladder calculi that have become lodged or whether the stricture simply developed some calcifications on it are not clear at this point. I suspect he developed a urethral stricture after his most recent endoscopic procedure for stone. This was much more likely given the fact that he had previously had radiation. I suspect a lot of his voiding symptoms over the last 6 to 12 months are related to this. The urodynamics tech did not have a lot of trouble getting a catheter in when we did that; therefore, we did not suspect a urethral stricture but it has obviously been present and has probably progressed. At this point, I suggest that we take him to outpatient surgery and perform a cystoscopy with lasering of the adhering calcifications and their removal. He will then need balloon dilation of the urethral stricture with further assessment of his prostatic urethra and bladder. He may benefit from a small incision in his prostate to open things up as well. I would anticipate an outpatient procedure with several days of an indwelling catheter. Mr Critcher and his wife are told that it is not difficult usually to open up a stricture but it can be very difficult to keep it open. He may have to go on a series of intermittent catheterizations a couple times a week to keep the area from [place]. We will need to get permission from his cardiologist to hold his anticoagulation.      Signatures Electronically signed by :  Rana Snare, M.D.; Dec 09 2014  1:38PM EST

## 2015-01-08 NOTE — Discharge Instructions (Signed)

## 2015-01-08 NOTE — Interval H&P Note (Signed)
History and Physical Interval Note:  01/08/2015 9:38 AM  Carlos Obrien  has presented today for surgery, with the diagnosis of URETHRAL STRICTURE, URETHRAL AND BLADDER CALCULI, BENIGN PROSTATIC HYPERPLASIA  The various methods of treatment have been discussed with the patient and family. After consideration of risks, benefits and other options for treatment, the patient has consented to  Procedure(s): CYSTOSCOPY WITH HOLMIUM LASER LITHOTRIPSY URETHRAL AND BLADDER CALCULI (N/A) CYSTOSCOPY WITH URETHRAL DILATATION (N/A) TRANSURETHRAL INCISION OF THE PROSTATE (TUIP) (N/A) as a surgical intervention .  The patient's history has been reviewed, patient examined, no change in status, stable for surgery.  I have reviewed the patient's chart and labs.  Questions were answered to the patient's satisfaction.     Darin Arndt S

## 2015-01-08 NOTE — Anesthesia Postprocedure Evaluation (Signed)
Anesthesia Post Note  Patient: ASIA CONNLEY  Procedure(s) Performed: Procedure(s) (LRB): CYSTOSCOPY WITH REMOVAL OF BLADDER STONE (N/A) CYSTOSCOPY WITH URETHRAL DILATATION (N/A)  Patient location during evaluation: PACU Anesthesia Type: General Level of consciousness: awake and alert Pain management: pain level controlled Vital Signs Assessment: post-procedure vital signs reviewed and stable Respiratory status: spontaneous breathing, nonlabored ventilation, respiratory function stable and patient connected to nasal cannula oxygen Cardiovascular status: blood pressure returned to baseline and stable Postop Assessment: no signs of nausea or vomiting Anesthetic complications: no    Last Vitals:  Filed Vitals:   01/08/15 0830 01/08/15 1025  BP: 144/92 129/98  Pulse: 78 85  Temp: 36.8 C 36.9 C  Resp: 18 10    Last Pain: There were no vitals filed for this visit.               Lochlin Eppinger A

## 2015-01-09 ENCOUNTER — Encounter (HOSPITAL_BASED_OUTPATIENT_CLINIC_OR_DEPARTMENT_OTHER): Payer: Self-pay | Admitting: Urology

## 2015-03-29 ENCOUNTER — Encounter: Payer: Self-pay | Admitting: Podiatry

## 2015-08-17 IMAGING — US ABDOMEN ULTRASOUND LIMITED
1 series · 13 of 25 positions shown · non-contrast
Comparison: none

REASON FOR EXAM: Abd Pain Bloating
COMMENTS:

[Series 1: abdomen ultrasound limited · 0.38mm/px · 13 of 94 slices shown]
[im 1/94]
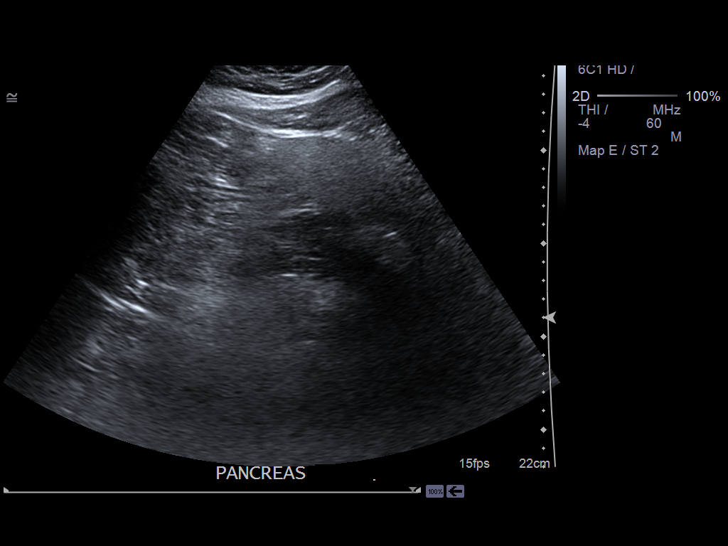
[im 8/94]
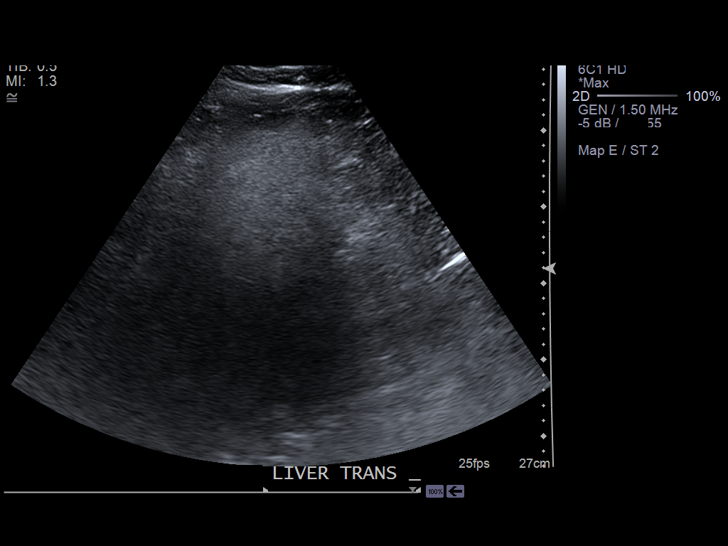
[im 16/94]
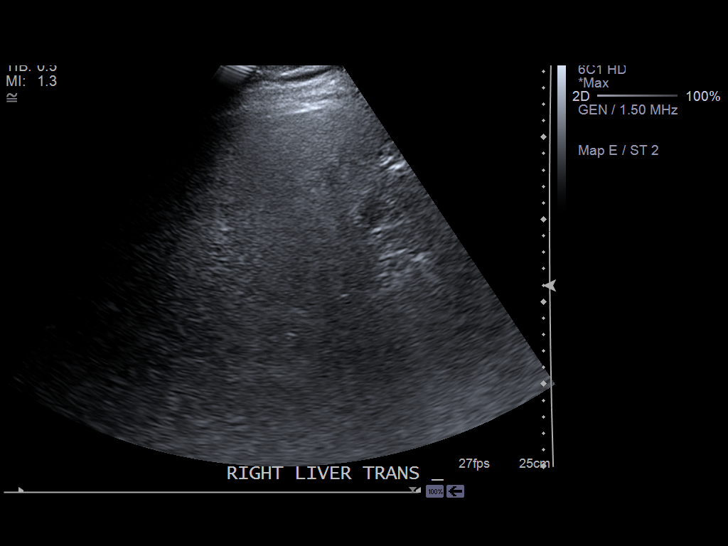
[im 24/94]
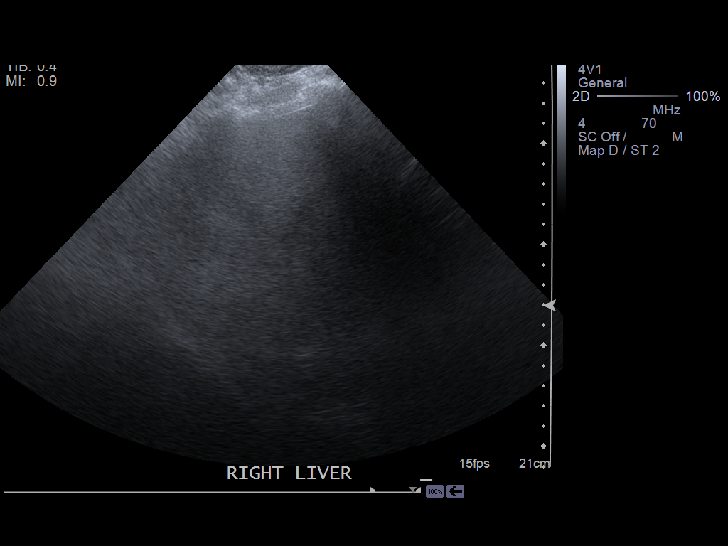
[im 32/94]
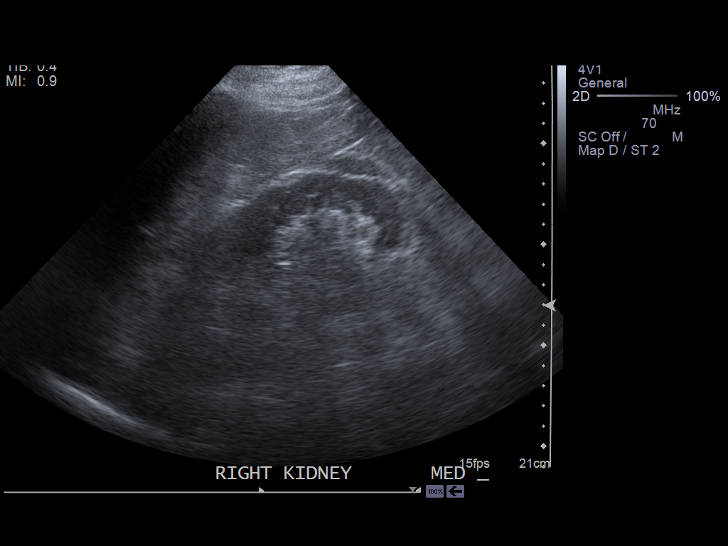
[im 39/94]
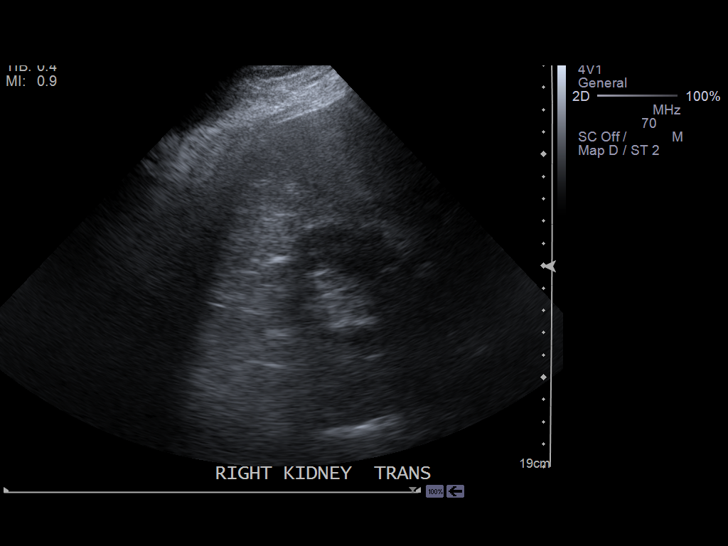
[im 47/94]
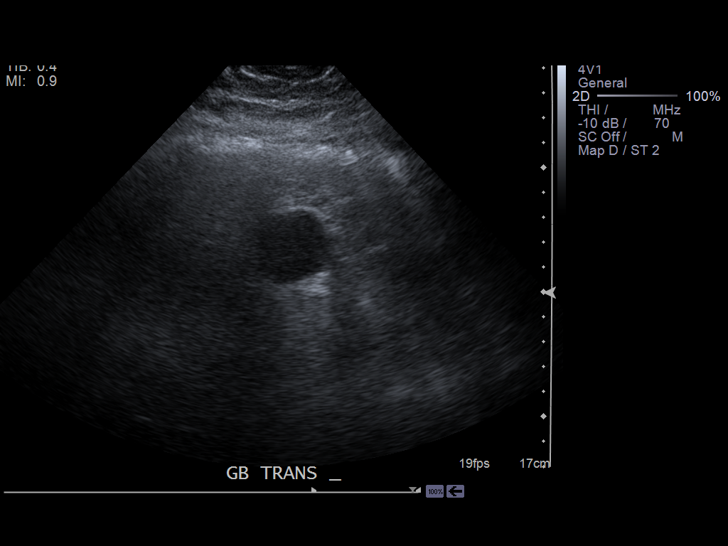
[im 55/94]
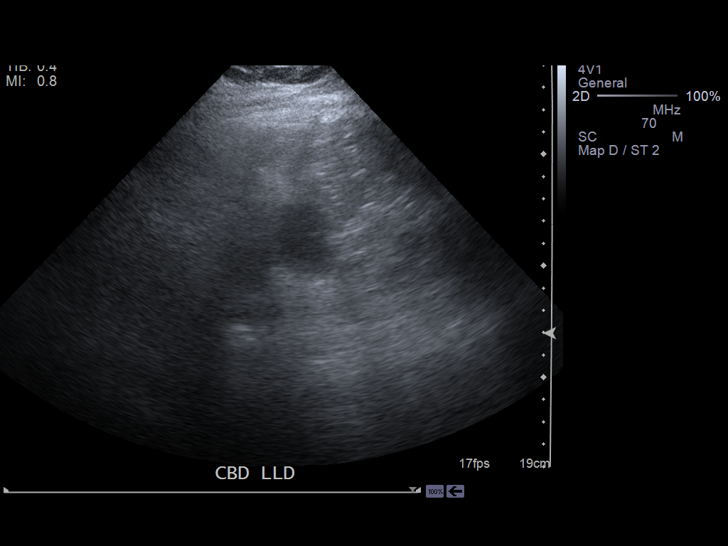
[im 63/94]
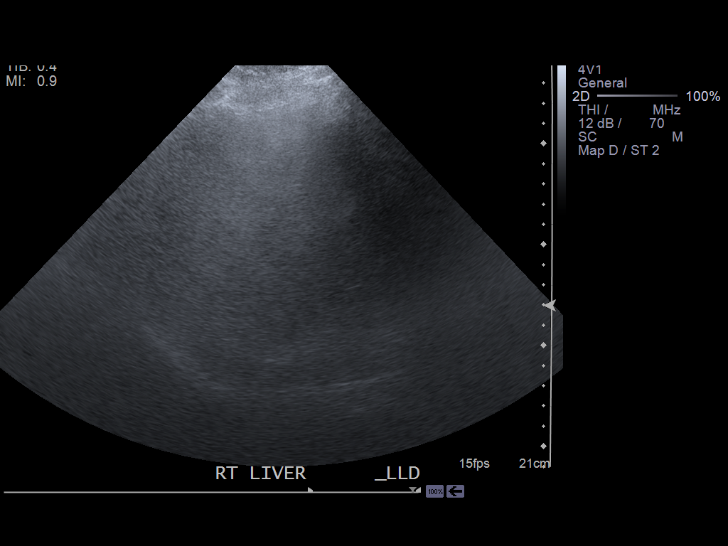
[im 70/94]
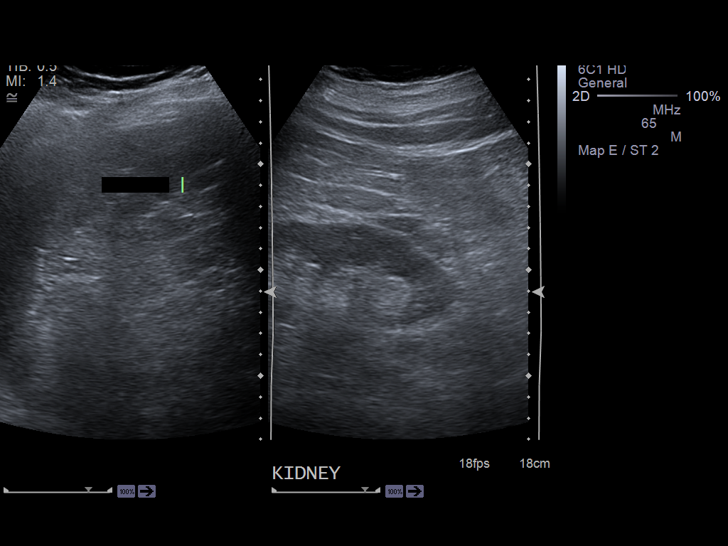
[im 78/94]
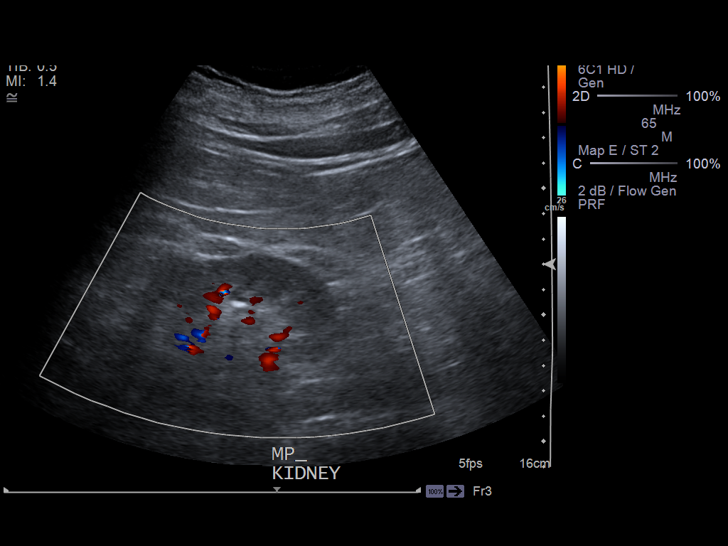
[im 86/94]
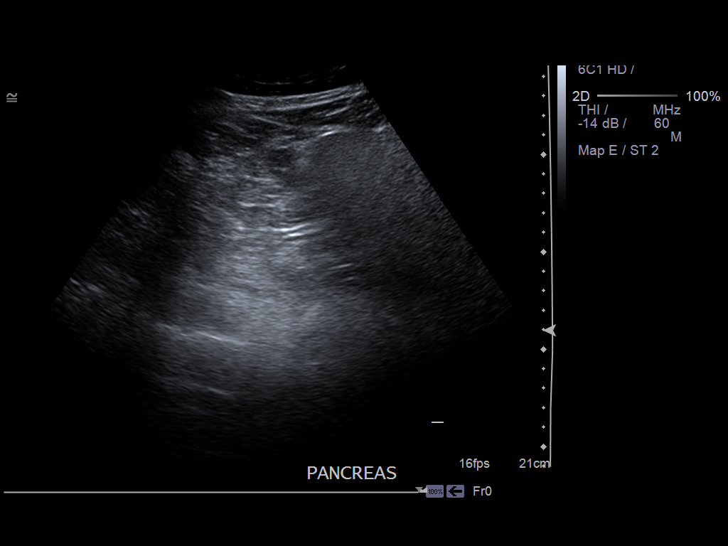
[im 94/94]
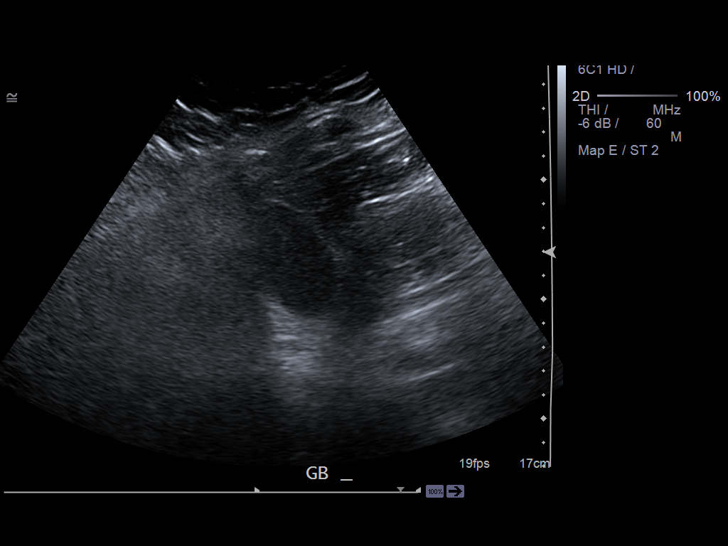

[13 of 25 positions shown; findings below may reference images not displayed]

PROCEDURE:     RISAKO - RISAKO ABDOMEN UPPER GENERAL  - September 25, 2012  [DATE]

RESULT:     The pancreas is poorly seen because of overlying bowel gas. The
liver is echogenic and difficult to penetrate. The aorta is poorly seen
because of overlying bowel gas. The liver length is 17.20 cm. The right
kidney measures 10.51 x 5.95 x 5.81 cm. Gallbladder wall thickness is
mm. There is a negative sonographic Murphy's sign. The common bile duct
could not be seen because of the hepatic echotexture and bowel gas. Portal
venous flow appears to be normal. The proximal inferior vena cava is not
visualized. The left kidney measures 11.32 x 4.96 x 5.44 cm. The spleen is
normal in size and echotexture measuring up to 10.66 cm. No ascites can be
seen.
IMPRESSION: Extremely limited ultrasound of the abdomen because of the
patient's very echogenic liver and overlying bowel gas obscuring portions of
the inferior vena cava, pancreas, common bile duct and aorta. There appears
to be diffuse hepatic steatosis with extreme difficulty penetrating the
liver to evaluate mass. No cholelithiasis or evidence of acute cholecystitis.

[REDACTED]

## 2015-08-30 ENCOUNTER — Other Ambulatory Visit: Payer: Self-pay | Admitting: Neurological Surgery

## 2015-08-30 DIAGNOSIS — M5416 Radiculopathy, lumbar region: Secondary | ICD-10-CM

## 2015-09-01 ENCOUNTER — Ambulatory Visit
Admission: RE | Admit: 2015-09-01 | Discharge: 2015-09-01 | Disposition: A | Discharge status: Home / Self Care | Payer: Medicare Other | Source: Ambulatory Visit | Attending: Neurological Surgery | Admitting: Neurological Surgery

## 2015-09-01 DIAGNOSIS — M5416 Radiculopathy, lumbar region: Secondary | ICD-10-CM

## 2016-01-30 ENCOUNTER — Other Ambulatory Visit: Payer: Self-pay | Admitting: Neurological Surgery

## 2016-02-03 NOTE — Pre-Procedure Instructions (Addendum)
Alexxander Kurt Lambright  02/03/2016      [facility] - [place], [place] - [place] [address] [place] [address] Phone: [phone] Fax: [phone]    Your procedure is scheduled on 02/09/16  Report to [facility] Admitting at 1030 per dr A.M.  Call this number if you have problems the morning of surgery:  [phone]   Remember:  Do not eat food or drink liquids after midnight.  Take these medicines the morning of surgery with A SIP OF WATER      Atenolol(tenormin),isosorbide(imdur),nitro if needed, rapaflo  STOP all herbel meds, nsaids (aleve,naproxen,advil,ibuprofen) starting TODAY including aspirin,celebrex,magnesium,any other vitamins/supplements  No metformin,actos, glipizide morning of surgery     How to Manage Your Diabetes Before and After Surgery  Why is it important to control my blood sugar before and after surgery? . Improving blood sugar levels before and after surgery helps healing and can limit problems. . A way of improving blood sugar control is eating a healthy diet by: o  Eating less sugar and carbohydrates o  Increasing activity/exercise o  Talking with your doctor about reaching your blood sugar goals . High blood sugars (greater than 180 mg/dL) can raise your risk of infections and slow your recovery, so you will need to focus on controlling your diabetes during the weeks before surgery. . Make sure that the doctor who takes care of your diabetes knows about your planned surgery including the date and location.  How do I manage my blood sugar before surgery? . Check your blood sugar at least 4 times a day, starting 2 days before surgery, to make sure that the level is not too high or low. o Check your blood sugar the morning of your surgery when you wake up and every 2 hours until you get to the Short Stay unit. . If your blood sugar is less than 70 mg/dL, you will need to treat for low blood sugar: o Do not take insulin. o Treat  a low blood sugar (less than 70 mg/dL) with  cup of clear juice (cranberry or apple), 4 glucose tablets, OR glucose gel. o Recheck blood sugar in 15 minutes after treatment (to make sure it is greater than 70 mg/dL). If your blood sugar is not greater than 70 mg/dL on recheck, call [phone] for further instructions. . Report your blood sugar to the short stay nurse when you get to Short Stay.  . If you are admitted to the hospital after surgery: o Your blood sugar will be checked by the staff and you will probably be given insulin after surgery (instead of oral diabetes medicines) to make sure you have good blood sugar levels. o The goal for blood sugar control after surgery is 80-180 mg/dL.  WHAT DO I DO ABOUT MY DIABETES MEDICATION?   Marland Kitchen Do not take oral diabetes medicines (pills) the morning of surgery.(metformin.actos, glipizide)  The day of surgery, do not take other diabetes injectables, including  Victoza (liraglutide),   Take 10 units of lantus nite before   Do not wear jewelry, make-up or nail polish.  Do not wear lotions, powders, or perfumes, or deoderant.  Do not shave 48 hours prior to surgery.  Men may shave face and neck.  Do not bring valuables to the hospital.  [facility] is not responsible for any belongings or valuables.  Contacts, dentures or bridgework may not be worn into surgery.  Leave your  suitcase in the car.  After surgery it may be brought to your room.  For patients admitted to the hospital, discharge time will be determined by your treatment team.  Patients discharged the day of surgery will not be allowed to drive home.   Special instructions:   Special Instructions: [facility] - Preparing for Surgery  Before surgery, you can play an important role.  Because skin is not sterile, your skin needs to be as free of germs as possible.  You can reduce the number of germs on you skin by washing with CHG (chlorahexidine gluconate) soap before surgery.  CHG  is an antiseptic cleaner which kills germs and bonds with the skin to continue killing germs even after washing.  Please DO NOT use if you have an allergy to CHG or antibacterial soaps.  If your skin becomes reddened/irritated stop using the CHG and inform your nurse when you arrive at Short Stay.  Do not shave (including legs and underarms) for at least 48 hours prior to the first CHG shower.  You may shave your face.  Please follow these instructions carefully:   1.  Shower with CHG Soap the night before surgery and the morning of Surgery.  2.  If you choose to wash your hair, wash your hair first as usual with your normal shampoo.  3.  After you shampoo, rinse your hair and body thoroughly to remove the Shampoo.  4.  Use CHG as you would any other liquid soap.  You can apply chg directly  to the skin and wash gently with scrungie or a clean washcloth.  5.  Apply the CHG Soap to your body ONLY FROM THE NECK DOWN.  Do not use on open wounds or open sores.  Avoid contact with your eyes ears, mouth and genitals (private parts).  Wash genitals (private parts)       with your normal soap.  6.  Wash thoroughly, paying special attention to the area where your surgery will be performed.  7.  Thoroughly rinse your body with warm water from the neck down.  8.  DO NOT shower/wash with your normal soap after using and rinsing off the CHG Soap.  9.  Pat yourself dry with a clean towel.            10.  Wear clean pajamas.            11.  Place clean sheets on your bed the night of your first shower and do not sleep with pets.  Day of Surgery  Do not apply any lotions/deodorants the morning of surgery.  Please wear clean clothes to the hospital/surgery center.  Please read over the fact sheets that you were given.

## 2016-02-04 ENCOUNTER — Encounter (HOSPITAL_COMMUNITY): Payer: Self-pay

## 2016-02-04 ENCOUNTER — Encounter (HOSPITAL_COMMUNITY)
Admission: RE | Admit: 2016-02-04 | Discharge: 2016-02-04 | Disposition: A | Discharge status: Home / Self Care | Payer: Medicare Other | Source: Ambulatory Visit | Attending: Neurological Surgery | Admitting: Neurological Surgery

## 2016-02-04 DIAGNOSIS — N189 Chronic kidney disease, unspecified: Secondary | ICD-10-CM | POA: Insufficient documentation

## 2016-02-04 DIAGNOSIS — I5032 Chronic diastolic (congestive) heart failure: Secondary | ICD-10-CM | POA: Insufficient documentation

## 2016-02-04 DIAGNOSIS — I480 Paroxysmal atrial fibrillation: Secondary | ICD-10-CM | POA: Insufficient documentation

## 2016-02-04 DIAGNOSIS — Z87442 Personal history of urinary calculi: Secondary | ICD-10-CM | POA: Diagnosis not present

## 2016-02-04 DIAGNOSIS — E1122 Type 2 diabetes mellitus with diabetic chronic kidney disease: Secondary | ICD-10-CM | POA: Insufficient documentation

## 2016-02-04 DIAGNOSIS — M5136 Other intervertebral disc degeneration, lumbar region: Secondary | ICD-10-CM | POA: Diagnosis not present

## 2016-02-04 DIAGNOSIS — I252 Old myocardial infarction: Secondary | ICD-10-CM | POA: Insufficient documentation

## 2016-02-04 DIAGNOSIS — I2510 Atherosclerotic heart disease of native coronary artery without angina pectoris: Secondary | ICD-10-CM | POA: Diagnosis not present

## 2016-02-04 DIAGNOSIS — I130 Hypertensive heart and chronic kidney disease with heart failure and stage 1 through stage 4 chronic kidney disease, or unspecified chronic kidney disease: Secondary | ICD-10-CM | POA: Diagnosis not present

## 2016-02-04 DIAGNOSIS — Z01812 Encounter for preprocedural laboratory examination: Secondary | ICD-10-CM | POA: Insufficient documentation

## 2016-02-04 HISTORY — DX: Type 2 diabetes mellitus without complications: E11.9

## 2016-02-04 HISTORY — DX: Anemia, unspecified: D64.9

## 2016-02-04 HISTORY — DX: Dyspnea, unspecified: R06.00

## 2016-02-04 HISTORY — DX: Cardiac arrhythmia, unspecified: I49.9

## 2016-02-04 LAB — CBC
HEMATOCRIT: 38.9 % — AB (ref 39.0–52.0)
Hemoglobin: 13 g/dL (ref 13.0–17.0)
MCH: 33.1 pg (ref 26.0–34.0)
MCHC: 33.4 g/dL (ref 30.0–36.0)
MCV: 99 fL (ref 78.0–100.0)
PLATELETS: 158 10*3/uL (ref 150–400)
RBC: 3.93 MIL/uL — AB (ref 4.22–5.81)
RDW: 14.7 % (ref 11.5–15.5)
WBC: 4.7 10*3/uL (ref 4.0–10.5)

## 2016-02-04 LAB — BASIC METABOLIC PANEL
Anion gap: 7 (ref 5–15)
BUN: 26 mg/dL — AB (ref 6–20)
CHLORIDE: 106 mmol/L (ref 101–111)
CO2: 24 mmol/L (ref 22–32)
CREATININE: 1.81 mg/dL — AB (ref 0.61–1.24)
Calcium: 9.6 mg/dL (ref 8.9–10.3)
GFR calc non Af Amer: 34 mL/min — ABNORMAL LOW (ref >60)
GFR, EST AFRICAN AMERICAN: 39 mL/min — AB (ref >60)
Glucose, Bld: 122 mg/dL — ABNORMAL HIGH (ref 65–99)
Potassium: 4.6 mmol/L (ref 3.5–5.1)
Sodium: 137 mmol/L (ref 135–145)

## 2016-02-04 LAB — SURGICAL PCR SCREEN
MRSA, PCR: NEGATIVE
Staphylococcus aureus: POSITIVE — AB

## 2016-02-04 NOTE — Progress Notes (Signed)
   02/04/16 0834  OBSTRUCTIVE SLEEP APNEA  Have you ever been diagnosed with sleep apnea through a sleep study? No  Do you snore loudly (loud enough to be heard through closed doors)?  1  Do you often feel tired, fatigued, or sleepy during the daytime (such as falling asleep during driving or talking to someone)? 0  Has anyone observed you stop breathing during your sleep? 0  Do you have, or are you being treated for high blood pressure? 1  BMI more than 35 kg/m2? 0  Age > 50 (1-yes) 1  Neck circumference greater than:Male 16 inches or larger, Male 17inches or larger? 1 (19.75)  Male Gender (Yes=1) 1  Obstructive Sleep Apnea Score 5  Score 5 or greater  Results sent to PCP

## 2016-02-05 LAB — HEMOGLOBIN A1C
Hgb A1c MFr Bld: 6.3 % — ABNORMAL HIGH (ref 4.8–5.6)
MEAN PLASMA GLUCOSE: 134 mg/dL

## 2016-02-05 NOTE — Progress Notes (Signed)
Anesthesia Chart Review:   Pt is a 82 year old male scheduled for L4-S1 laminectomy for decompression on 02/09/2016 with Cyndy Freeze, MD.   - PCP is Fulton Reek, MD - Cardiologist is Isaias Cowman, MD, who cleared pt for surgery at last office visit 02/03/16 - Endocrinologist is Lucilla Lame, MD (notes for each in care everywhere)  Past medical history includes: CAD (by notes, occluded RCA, 60% LAD), PAF, diastolic CHF, HTN, DM, hyperlipidemia, TKD, anemia, colon cancer, prostate cancer, GERD. Never smoker. BMI 35.  Medications include: Eliquis, ASA, atenolol, Lipitor, Lasix, glipizide, Lantus, Imdur, liraglutide, metformin, Prilosec, pioglitazone, ramipril.  Patient to stop Eliquis 3 days before surgery and ASA 7 days before surgery.  Preoperative labs reviewed.   - HbA1c 6.3, glucose 122 - PT will be obtained DOS.  - Cr 1.81/BUN 26, consistent with recent prior results (care everywhere)  EKG 01/30/16 Jefm Bryant): Atrial fibrillation (105 bpm).  Nuclear stress test 02/03/16 (care everywhere): 1.Normal left ventricular function 2.Normal wall motion 3.Trivial to mild apical ischemia  Echo 02/17/15 (care everywhere): 1. Normal LV systolic function. EF 55%. 2. Mild RV systolic dysfunction. 3. Mild MR, mild TR. 4. Trivial AR, trivial PR. 5. No valvular stenosis  If no changes, I anticipate pt can proceed with surgery as scheduled.   Willeen Cass, FNP-BC [facility]/Anesthesiology Phone: [phone] 02/05/2016 2:29 PM

## 2016-02-09 ENCOUNTER — Ambulatory Visit (HOSPITAL_COMMUNITY): Payer: Medicare Other | Admitting: Emergency Medicine

## 2016-02-09 ENCOUNTER — Encounter (HOSPITAL_COMMUNITY)
Admission: RE | Disposition: A | Discharge status: Home / Self Care | Payer: Self-pay | Source: Ambulatory Visit | Attending: Neurological Surgery

## 2016-02-09 ENCOUNTER — Encounter (HOSPITAL_COMMUNITY): Payer: Self-pay | Admitting: Certified Registered Nurse Anesthetist

## 2016-02-09 ENCOUNTER — Ambulatory Visit (HOSPITAL_COMMUNITY): Payer: Medicare Other | Admitting: Certified Registered Nurse Anesthetist

## 2016-02-09 ENCOUNTER — Ambulatory Visit (HOSPITAL_COMMUNITY): Payer: Medicare Other

## 2016-02-09 ENCOUNTER — Ambulatory Visit (HOSPITAL_COMMUNITY)
Admission: RE | Admit: 2016-02-09 | Discharge: 2016-02-10 | Disposition: A | Discharge status: Home / Self Care | Payer: Medicare Other | Source: Ambulatory Visit | Attending: Neurological Surgery | Admitting: Neurological Surgery

## 2016-02-09 DIAGNOSIS — I509 Heart failure, unspecified: Secondary | ICD-10-CM | POA: Diagnosis not present

## 2016-02-09 DIAGNOSIS — M4727 Other spondylosis with radiculopathy, lumbosacral region: Secondary | ICD-10-CM | POA: Diagnosis not present

## 2016-02-09 DIAGNOSIS — I130 Hypertensive heart and chronic kidney disease with heart failure and stage 1 through stage 4 chronic kidney disease, or unspecified chronic kidney disease: Secondary | ICD-10-CM | POA: Insufficient documentation

## 2016-02-09 DIAGNOSIS — I2510 Atherosclerotic heart disease of native coronary artery without angina pectoris: Secondary | ICD-10-CM | POA: Insufficient documentation

## 2016-02-09 DIAGNOSIS — I480 Paroxysmal atrial fibrillation: Secondary | ICD-10-CM | POA: Insufficient documentation

## 2016-02-09 DIAGNOSIS — E1122 Type 2 diabetes mellitus with diabetic chronic kidney disease: Secondary | ICD-10-CM | POA: Insufficient documentation

## 2016-02-09 DIAGNOSIS — Z85828 Personal history of other malignant neoplasm of skin: Secondary | ICD-10-CM | POA: Diagnosis not present

## 2016-02-09 DIAGNOSIS — Z8546 Personal history of malignant neoplasm of prostate: Secondary | ICD-10-CM | POA: Insufficient documentation

## 2016-02-09 DIAGNOSIS — M5136 Other intervertebral disc degeneration, lumbar region: Secondary | ICD-10-CM | POA: Insufficient documentation

## 2016-02-09 DIAGNOSIS — N189 Chronic kidney disease, unspecified: Secondary | ICD-10-CM | POA: Insufficient documentation

## 2016-02-09 DIAGNOSIS — K219 Gastro-esophageal reflux disease without esophagitis: Secondary | ICD-10-CM | POA: Diagnosis not present

## 2016-02-09 DIAGNOSIS — Z7901 Long term (current) use of anticoagulants: Secondary | ICD-10-CM | POA: Insufficient documentation

## 2016-02-09 DIAGNOSIS — I252 Old myocardial infarction: Secondary | ICD-10-CM | POA: Diagnosis not present

## 2016-02-09 DIAGNOSIS — Z7982 Long term (current) use of aspirin: Secondary | ICD-10-CM | POA: Diagnosis not present

## 2016-02-09 DIAGNOSIS — Z85038 Personal history of other malignant neoplasm of large intestine: Secondary | ICD-10-CM | POA: Diagnosis not present

## 2016-02-09 DIAGNOSIS — N400 Enlarged prostate without lower urinary tract symptoms: Secondary | ICD-10-CM | POA: Insufficient documentation

## 2016-02-09 DIAGNOSIS — Z794 Long term (current) use of insulin: Secondary | ICD-10-CM | POA: Insufficient documentation

## 2016-02-09 HISTORY — PX: LUMBAR LAMINECTOMY/DECOMPRESSION MICRODISCECTOMY: SHX5026

## 2016-02-09 LAB — GLUCOSE, CAPILLARY
GLUCOSE-CAPILLARY: 128 mg/dL — AB (ref 65–99)
GLUCOSE-CAPILLARY: 150 mg/dL — AB (ref 65–99)
GLUCOSE-CAPILLARY: 231 mg/dL — AB (ref 65–99)
Glucose-Capillary: 150 mg/dL — ABNORMAL HIGH (ref 65–99)
Glucose-Capillary: 105 mg/dL — ABNORMAL HIGH (ref 65–99)

## 2016-02-09 LAB — PROTIME-INR
INR: 1.18
Prothrombin Time: 15.1 seconds (ref 11.4–15.2)

## 2016-02-09 SURGERY — LUMBAR LAMINECTOMY/DECOMPRESSION MICRODISCECTOMY 2 LEVELS
Anesthesia: General

## 2016-02-09 MED ORDER — SODIUM CHLORIDE 0.9 % IR SOLN
Status: DC | PRN
  Administered 2016-02-09: 14:00:00

## 2016-02-09 MED ORDER — ASPIRIN EC 81 MG PO TBEC
81.0000 mg | DELAYED_RELEASE_TABLET | Freq: Every day | ORAL | Status: DC
  Administered 2016-02-10: 81 mg via ORAL
  Filled 2016-02-09: qty 1

## 2016-02-09 MED ORDER — VANCOMYCIN HCL 1000 MG IV SOLR
INTRAVENOUS | Status: DC | PRN
  Administered 2016-02-09: 1000 mg via TOPICAL

## 2016-02-09 MED ORDER — MAGNESIUM OXIDE 400 (241.3 MG) MG PO TABS
400.0000 mg | ORAL_TABLET | Freq: Every evening | ORAL | Status: DC
  Administered 2016-02-09: 400 mg via ORAL
  Filled 2016-02-09: qty 1

## 2016-02-09 MED ORDER — FLEET ENEMA 7-19 GM/118ML RE ENEM: 1.0000 | ENEMA | Freq: Once | RECTAL | Status: DC | PRN

## 2016-02-09 MED ORDER — PROPOFOL 10 MG/ML IV BOLUS
INTRAVENOUS | Status: DC | PRN
  Administered 2016-02-09: 160 mg via INTRAVENOUS

## 2016-02-09 MED ORDER — PIOGLITAZONE HCL 30 MG PO TABS
30.0000 mg | ORAL_TABLET | Freq: Every morning | ORAL | Status: DC
  Administered 2016-02-10: 30 mg via ORAL
  Filled 2016-02-09: qty 1

## 2016-02-09 MED ORDER — FENTANYL CITRATE (PF) 100 MCG/2ML IJ SOLN
INTRAMUSCULAR | Status: AC
  Filled 2016-02-09: qty 2

## 2016-02-09 MED ORDER — ACETAMINOPHEN 500 MG PO TABS
1000.0000 mg | ORAL_TABLET | Freq: Four times a day (QID) | ORAL | Status: DC
  Administered 2016-02-09 – 2016-02-10 (×2): 1000 mg via ORAL
  Filled 2016-02-09 (×2): qty 2

## 2016-02-09 MED ORDER — LIDOCAINE HCL (CARDIAC) 20 MG/ML IV SOLN
INTRAVENOUS | Status: DC | PRN
  Administered 2016-02-09: 100 mg via INTRAVENOUS

## 2016-02-09 MED ORDER — ATENOLOL 25 MG PO TABS
50.0000 mg | ORAL_TABLET | Freq: Every morning | ORAL | Status: DC
  Administered 2016-02-10: 50 mg via ORAL
  Filled 2016-02-09: qty 2

## 2016-02-09 MED ORDER — METHYLPREDNISOLONE ACETATE 80 MG/ML IJ SUSP
INTRAMUSCULAR | Status: AC
  Filled 2016-02-09: qty 1

## 2016-02-09 MED ORDER — GLIPIZIDE 5 MG PO TABS
20.0000 mg | ORAL_TABLET | Freq: Every day | ORAL | Status: DC
  Administered 2016-02-10: 20 mg via ORAL
  Filled 2016-02-09: qty 4

## 2016-02-09 MED ORDER — PANTOPRAZOLE SODIUM 40 MG IV SOLR: 40.0000 mg | Freq: Every day | INTRAVENOUS | Status: DC

## 2016-02-09 MED ORDER — SUCCINYLCHOLINE CHLORIDE 200 MG/10ML IV SOSY
PREFILLED_SYRINGE | INTRAVENOUS | Status: AC
  Filled 2016-02-09: qty 10

## 2016-02-09 MED ORDER — BUPIVACAINE LIPOSOME 1.3 % IJ SUSP
INTRAMUSCULAR | Status: DC | PRN
  Administered 2016-02-09: 20 mL

## 2016-02-09 MED ORDER — METHOCARBAMOL 750 MG PO TABS
750.0000 mg | ORAL_TABLET | Freq: Four times a day (QID) | ORAL | Status: DC
  Administered 2016-02-09 – 2016-02-10 (×3): 750 mg via ORAL
  Filled 2016-02-09 (×3): qty 1

## 2016-02-09 MED ORDER — OXYCODONE HCL ER 20 MG PO T12A
20.0000 mg | EXTENDED_RELEASE_TABLET | Freq: Two times a day (BID) | ORAL | Status: DC
  Administered 2016-02-09 – 2016-02-10 (×2): 20 mg via ORAL
  Filled 2016-02-09 (×2): qty 1

## 2016-02-09 MED ORDER — PHENYLEPHRINE HCL 10 MG/ML IJ SOLN
INTRAMUSCULAR | Status: DC | PRN
  Administered 2016-02-09: 80 ug via INTRAVENOUS
  Administered 2016-02-09: 120 ug via INTRAVENOUS
  Administered 2016-02-09 (×2): 80 ug via INTRAVENOUS

## 2016-02-09 MED ORDER — THROMBIN 5000 UNITS EX SOLR
CUTANEOUS | Status: DC | PRN
  Administered 2016-02-09: 10000 [IU] via TOPICAL

## 2016-02-09 MED ORDER — PANTOPRAZOLE SODIUM 40 MG PO TBEC
40.0000 mg | DELAYED_RELEASE_TABLET | Freq: Every day | ORAL | Status: DC
  Administered 2016-02-10: 40 mg via ORAL
  Filled 2016-02-09: qty 1

## 2016-02-09 MED ORDER — GABAPENTIN 300 MG PO CAPS
300.0000 mg | ORAL_CAPSULE | Freq: Three times a day (TID) | ORAL | Status: DC
  Administered 2016-02-09 – 2016-02-10 (×2): 300 mg via ORAL
  Filled 2016-02-09 (×3): qty 1

## 2016-02-09 MED ORDER — OXYCODONE HCL 5 MG PO TABS: 5.0000 mg | ORAL_TABLET | ORAL | Status: DC | PRN

## 2016-02-09 MED ORDER — VITAMIN D 1000 UNITS PO TABS
2000.0000 [IU] | ORAL_TABLET | Freq: Every day | ORAL | Status: DC
  Administered 2016-02-09 – 2016-02-10 (×2): 2000 [IU] via ORAL
  Filled 2016-02-09 (×2): qty 2

## 2016-02-09 MED ORDER — HEMOSTATIC AGENTS (NO CHARGE) OPTIME
TOPICAL | Status: DC | PRN
  Administered 2016-02-09: 1 via TOPICAL

## 2016-02-09 MED ORDER — TAMSULOSIN HCL 0.4 MG PO CAPS
0.4000 mg | ORAL_CAPSULE | Freq: Every day | ORAL | Status: DC
  Filled 2016-02-09: qty 1

## 2016-02-09 MED ORDER — NITROGLYCERIN 0.4 MG SL SUBL: 0.4000 mg | SUBLINGUAL_TABLET | SUBLINGUAL | Status: DC | PRN

## 2016-02-09 MED ORDER — BUPIVACAINE LIPOSOME 1.3 % IJ SUSP
20.0000 mL | INTRAMUSCULAR | Status: DC
  Filled 2016-02-09: qty 20

## 2016-02-09 MED ORDER — PHENYLEPHRINE 40 MCG/ML (10ML) SYRINGE FOR IV PUSH (FOR BLOOD PRESSURE SUPPORT)
PREFILLED_SYRINGE | INTRAVENOUS | Status: AC
  Filled 2016-02-09: qty 10

## 2016-02-09 MED ORDER — LIRAGLUTIDE 18 MG/3ML ~~LOC~~ SOPN
1.8000 mg | PEN_INJECTOR | Freq: Every morning | SUBCUTANEOUS | Status: DC
  Administered 2016-02-10: 1.8 mg via SUBCUTANEOUS

## 2016-02-09 MED ORDER — POTASSIUM CHLORIDE IN NACL 20-0.9 MEQ/L-% IV SOLN
100.0000 mL/h | INTRAVENOUS | Status: DC
  Administered 2016-02-09 – 2016-02-10 (×2): 100 mL/h via INTRAVENOUS
  Filled 2016-02-09 (×3): qty 1000

## 2016-02-09 MED ORDER — FENTANYL CITRATE (PF) 100 MCG/2ML IJ SOLN: 25.0000 ug | INTRAMUSCULAR | Status: DC | PRN

## 2016-02-09 MED ORDER — ROCURONIUM BROMIDE 100 MG/10ML IV SOLN
INTRAVENOUS | Status: DC | PRN
  Administered 2016-02-09: 50 mg via INTRAVENOUS

## 2016-02-09 MED ORDER — CHLORHEXIDINE GLUCONATE CLOTH 2 % EX PADS: 6.0000 | MEDICATED_PAD | Freq: Once | CUTANEOUS | Status: DC

## 2016-02-09 MED ORDER — VITAMIN B-12 1000 MCG PO TABS
1000.0000 ug | ORAL_TABLET | Freq: Every day | ORAL | Status: DC
  Administered 2016-02-09 – 2016-02-10 (×2): 1000 ug via ORAL
  Filled 2016-02-09 (×2): qty 1

## 2016-02-09 MED ORDER — ROCURONIUM BROMIDE 50 MG/5ML IV SOSY
PREFILLED_SYRINGE | INTRAVENOUS | Status: AC
  Filled 2016-02-09: qty 5

## 2016-02-09 MED ORDER — SODIUM CHLORIDE 0.9% FLUSH
3.0000 mL | Freq: Two times a day (BID) | INTRAVENOUS | Status: DC
  Administered 2016-02-10: 3 mL via INTRAVENOUS

## 2016-02-09 MED ORDER — ATORVASTATIN CALCIUM 10 MG PO TABS
20.0000 mg | ORAL_TABLET | Freq: Every evening | ORAL | Status: DC
  Administered 2016-02-09: 20 mg via ORAL
  Filled 2016-02-09 (×2): qty 2

## 2016-02-09 MED ORDER — 0.9 % SODIUM CHLORIDE (POUR BTL) OPTIME
TOPICAL | Status: DC | PRN
  Administered 2016-02-09: 1000 mL

## 2016-02-09 MED ORDER — PHENOL 1.4 % MT LIQD: 1.0000 | OROMUCOSAL | Status: DC | PRN

## 2016-02-09 MED ORDER — INSULIN GLARGINE 100 UNIT/ML ~~LOC~~ SOLN
20.0000 [IU] | Freq: Every day | SUBCUTANEOUS | Status: DC
Start: 2016-02-09 — End: 2016-02-10
  Administered 2016-02-09: 20 [IU] via SUBCUTANEOUS
  Filled 2016-02-09 (×2): qty 0.2

## 2016-02-09 MED ORDER — GELATIN ABSORBABLE MT POWD
OROMUCOSAL | Status: DC | PRN
  Administered 2016-02-09: 14:00:00 via TOPICAL

## 2016-02-09 MED ORDER — RAMIPRIL 2.5 MG PO CAPS
2.5000 mg | ORAL_CAPSULE | Freq: Every morning | ORAL | Status: DC
  Administered 2016-02-10: 2.5 mg via ORAL
  Filled 2016-02-09: qty 1

## 2016-02-09 MED ORDER — KETOROLAC TROMETHAMINE 30 MG/ML IJ SOLN
INTRAMUSCULAR | Status: AC
  Filled 2016-02-09: qty 1

## 2016-02-09 MED ORDER — SUGAMMADEX SODIUM 200 MG/2ML IV SOLN
INTRAVENOUS | Status: DC | PRN
  Administered 2016-02-09: 500 mg via INTRAVENOUS

## 2016-02-09 MED ORDER — PHENYLEPHRINE HCL 10 MG/ML IJ SOLN
INTRAVENOUS | Status: DC | PRN
  Administered 2016-02-09: 40 ug/min via INTRAVENOUS

## 2016-02-09 MED ORDER — INSULIN ASPART 100 UNIT/ML ~~LOC~~ SOLN
0.0000 [IU] | Freq: Three times a day (TID) | SUBCUTANEOUS | Status: DC
  Administered 2016-02-09: 3 [IU] via SUBCUTANEOUS
  Administered 2016-02-10: 4 [IU] via SUBCUTANEOUS

## 2016-02-09 MED ORDER — FENTANYL CITRATE (PF) 100 MCG/2ML IJ SOLN
INTRAMUSCULAR | Status: AC
  Filled 2016-02-09: qty 4

## 2016-02-09 MED ORDER — DEXAMETHASONE SODIUM PHOSPHATE 10 MG/ML IJ SOLN
INTRAMUSCULAR | Status: AC
  Filled 2016-02-09: qty 1

## 2016-02-09 MED ORDER — LIDOCAINE 2% (20 MG/ML) 5 ML SYRINGE
INTRAMUSCULAR | Status: AC
  Filled 2016-02-09: qty 5

## 2016-02-09 MED ORDER — ONDANSETRON HCL 4 MG/2ML IJ SOLN
INTRAMUSCULAR | Status: AC
  Filled 2016-02-09: qty 2

## 2016-02-09 MED ORDER — SUGAMMADEX SODIUM 500 MG/5ML IV SOLN
INTRAVENOUS | Status: AC
  Filled 2016-02-09: qty 5

## 2016-02-09 MED ORDER — DEXTROSE 5 % IV SOLN
3.0000 g | INTRAVENOUS | Status: AC
  Administered 2016-02-09: 3 g via INTRAVENOUS
  Filled 2016-02-09: qty 3000

## 2016-02-09 MED ORDER — FUROSEMIDE 40 MG PO TABS
40.0000 mg | ORAL_TABLET | Freq: Every morning | ORAL | Status: DC
  Administered 2016-02-10: 40 mg via ORAL
  Filled 2016-02-09: qty 1

## 2016-02-09 MED ORDER — SODIUM CHLORIDE 0.9% FLUSH: 3.0000 mL | INTRAVENOUS | Status: DC | PRN

## 2016-02-09 MED ORDER — CEFAZOLIN IN D5W 1 GM/50ML IV SOLN
1.0000 g | Freq: Three times a day (TID) | INTRAVENOUS | Status: AC
  Administered 2016-02-09 – 2016-02-10 (×2): 1 g via INTRAVENOUS
  Filled 2016-02-09 (×2): qty 50

## 2016-02-09 MED ORDER — SUCCINYLCHOLINE CHLORIDE 20 MG/ML IJ SOLN
INTRAMUSCULAR | Status: DC | PRN
  Administered 2016-02-09: 140 mg via INTRAVENOUS

## 2016-02-09 MED ORDER — ISOSORBIDE MONONITRATE ER 30 MG PO TB24
30.0000 mg | ORAL_TABLET | Freq: Every morning | ORAL | Status: DC
  Administered 2016-02-10: 30 mg via ORAL
  Filled 2016-02-09: qty 1

## 2016-02-09 MED ORDER — ONDANSETRON HCL 4 MG/2ML IJ SOLN
INTRAMUSCULAR | Status: DC | PRN
  Administered 2016-02-09: 4 mg via INTRAVENOUS

## 2016-02-09 MED ORDER — MENTHOL 3 MG MT LOZG: 1.0000 | LOZENGE | OROMUCOSAL | Status: DC | PRN

## 2016-02-09 MED ORDER — DEXAMETHASONE SODIUM PHOSPHATE 4 MG/ML IJ SOLN
INTRAMUSCULAR | Status: DC | PRN
  Administered 2016-02-09: 10 mg via INTRAVENOUS

## 2016-02-09 MED ORDER — THROMBIN 5000 UNITS EX SOLR
CUTANEOUS | Status: AC
  Filled 2016-02-09: qty 15000

## 2016-02-09 MED ORDER — SODIUM CHLORIDE 0.9 % IJ SOLN
INTRAMUSCULAR | Status: AC
  Filled 2016-02-09: qty 20

## 2016-02-09 MED ORDER — LIDOCAINE-EPINEPHRINE 2 %-1:100000 IJ SOLN
INTRAMUSCULAR | Status: DC | PRN
  Administered 2016-02-09: 15 mL via INTRADERMAL

## 2016-02-09 MED ORDER — GABAPENTIN 100 MG PO CAPS: 100.0000 mg | ORAL_CAPSULE | Freq: Every day | ORAL | Status: DC

## 2016-02-09 MED ORDER — BISACODYL 10 MG RE SUPP: 10.0000 mg | Freq: Every day | RECTAL | Status: DC | PRN

## 2016-02-09 MED ORDER — PROPOFOL 10 MG/ML IV BOLUS
INTRAVENOUS | Status: AC
  Filled 2016-02-09: qty 40

## 2016-02-09 MED ORDER — BUPIVACAINE HCL (PF) 0.25 % IJ SOLN
INTRAMUSCULAR | Status: AC
  Filled 2016-02-09: qty 30

## 2016-02-09 MED ORDER — ONDANSETRON HCL 4 MG/2ML IJ SOLN: 4.0000 mg | INTRAMUSCULAR | Status: DC | PRN

## 2016-02-09 MED ORDER — SODIUM CHLORIDE 0.9 % IV SOLN
250.0000 mL | INTRAVENOUS | Status: DC
  Administered 2016-02-09: 250 mL via INTRAVENOUS

## 2016-02-09 MED ORDER — DOCUSATE SODIUM 100 MG PO CAPS
100.0000 mg | ORAL_CAPSULE | Freq: Two times a day (BID) | ORAL | Status: DC
  Administered 2016-02-09 – 2016-02-10 (×2): 100 mg via ORAL
  Filled 2016-02-09 (×2): qty 1

## 2016-02-09 MED ORDER — GLYCOPYRROLATE 0.2 MG/ML IJ SOLN
INTRAMUSCULAR | Status: DC | PRN
  Administered 2016-02-09: 0.2 mg via INTRAVENOUS

## 2016-02-09 MED ORDER — MIDAZOLAM HCL 2 MG/2ML IJ SOLN
INTRAMUSCULAR | Status: AC
  Filled 2016-02-09: qty 2

## 2016-02-09 MED ORDER — BUPIVACAINE HCL (PF) 0.25 % IJ SOLN
INTRAMUSCULAR | Status: DC | PRN
  Administered 2016-02-09: 15 mL

## 2016-02-09 MED ORDER — FENTANYL CITRATE (PF) 100 MCG/2ML IJ SOLN
INTRAMUSCULAR | Status: DC | PRN
  Administered 2016-02-09 (×3): 100 ug via INTRAVENOUS

## 2016-02-09 MED ORDER — SODIUM CHLORIDE 0.9 % IJ SOLN
INTRAMUSCULAR | Status: DC | PRN
  Administered 2016-02-09: 20 mL

## 2016-02-09 MED ORDER — VANCOMYCIN HCL 1000 MG IV SOLR
INTRAVENOUS | Status: AC
  Filled 2016-02-09: qty 1000

## 2016-02-09 MED ORDER — LIDOCAINE-EPINEPHRINE (PF) 2 %-1:200000 IJ SOLN
INTRAMUSCULAR | Status: AC
  Filled 2016-02-09: qty 20

## 2016-02-09 MED ORDER — CELECOXIB 200 MG PO CAPS
200.0000 mg | ORAL_CAPSULE | Freq: Two times a day (BID) | ORAL | Status: DC
  Administered 2016-02-09 – 2016-02-10 (×2): 200 mg via ORAL
  Filled 2016-02-09 (×2): qty 1

## 2016-02-09 MED ORDER — INSULIN ASPART 100 UNIT/ML ~~LOC~~ SOLN
0.0000 [IU] | Freq: Every day | SUBCUTANEOUS | Status: DC
  Administered 2016-02-09: 2 [IU] via SUBCUTANEOUS

## 2016-02-09 MED ORDER — ALUM & MAG HYDROXIDE-SIMETH 200-200-20 MG/5ML PO SUSP: 30.0000 mL | Freq: Four times a day (QID) | ORAL | Status: DC | PRN

## 2016-02-09 MED ORDER — CELECOXIB 200 MG PO CAPS
200.0000 mg | ORAL_CAPSULE | ORAL | Status: AC
  Administered 2016-02-09: 200 mg via ORAL
  Filled 2016-02-09: qty 1

## 2016-02-09 MED ORDER — CELECOXIB 200 MG PO CAPS: 200.0000 mg | ORAL_CAPSULE | Freq: Two times a day (BID) | ORAL | Status: DC

## 2016-02-09 MED ORDER — MIDAZOLAM HCL 5 MG/5ML IJ SOLN
INTRAMUSCULAR | Status: DC | PRN
  Administered 2016-02-09: 2 mg via INTRAVENOUS

## 2016-02-09 MED ORDER — LACTATED RINGERS IV SOLN
INTRAVENOUS | Status: DC
  Administered 2016-02-09 (×3): via INTRAVENOUS

## 2016-02-09 MED ORDER — METFORMIN HCL 500 MG PO TABS
1000.0000 mg | ORAL_TABLET | Freq: Two times a day (BID) | ORAL | Status: DC
  Administered 2016-02-09 – 2016-02-10 (×2): 1000 mg via ORAL
  Filled 2016-02-09 (×2): qty 2

## 2016-02-09 MED ORDER — ASPIRIN 325 MG PO TABS: 325.0000 mg | ORAL_TABLET | Freq: Every day | ORAL | Status: DC

## 2016-02-09 MED ORDER — SENNA 8.6 MG PO TABS
1.0000 | ORAL_TABLET | Freq: Two times a day (BID) | ORAL | Status: DC
  Administered 2016-02-09 – 2016-02-10 (×2): 8.6 mg via ORAL
  Filled 2016-02-09 (×2): qty 1

## 2016-02-09 SURGICAL SUPPLY — 68 items
BAG DECANTER FOR FLEXI CONT (MISCELLANEOUS) ×3 IMPLANT
BENZOIN TINCTURE PRP APPL 2/3 (GAUZE/BANDAGES/DRESSINGS) IMPLANT
BIT DRILL NEURO 2X3.1 SFT TUCH (MISCELLANEOUS) ×1 IMPLANT
BLADE CLIPPER SURG (BLADE) IMPLANT
BLADE SURG 11 STRL SS (BLADE) ×3 IMPLANT
BRUSH SCRUB EZ PLAIN DRY (MISCELLANEOUS) ×3 IMPLANT
BUR ROUND FLUTED 5 RND (BURR) ×2 IMPLANT
BUR ROUND FLUTED 5MM RND (BURR) ×1
CANISTER SUCT 3000ML PPV (MISCELLANEOUS) ×6 IMPLANT
CARTRIDGE OIL MAESTRO DRILL (MISCELLANEOUS) ×1 IMPLANT
CHLORAPREP W/TINT 26ML (MISCELLANEOUS) ×3 IMPLANT
CLOSURE WOUND 1/2 X4 (GAUZE/BANDAGES/DRESSINGS)
CONT SPEC 4OZ CLIKSEAL STRL BL (MISCELLANEOUS) ×3 IMPLANT
DECANTER SPIKE VIAL GLASS SM (MISCELLANEOUS) ×3 IMPLANT
DERMABOND ADVANCED (GAUZE/BANDAGES/DRESSINGS) ×2
DERMABOND ADVANCED .7 DNX12 (GAUZE/BANDAGES/DRESSINGS) ×1 IMPLANT
DIFFUSER DRILL AIR PNEUMATIC (MISCELLANEOUS) ×3 IMPLANT
DRAPE MICROSCOPE LEICA (MISCELLANEOUS) ×3 IMPLANT
DRAPE POUCH INSTRU U-SHP 10X18 (DRAPES) ×3 IMPLANT
DRAPE SURG 17X23 STRL (DRAPES) ×3 IMPLANT
DRILL NEURO 2X3.1 SOFT TOUCH (MISCELLANEOUS) ×3
DRSG OPSITE POSTOP 4X8 (GAUZE/BANDAGES/DRESSINGS) ×3 IMPLANT
ELECT BLADE 4.0 EZ CLEAN MEGAD (MISCELLANEOUS) ×3
ELECT REM PT RETURN 9FT ADLT (ELECTROSURGICAL) ×3
ELECTRODE BLDE 4.0 EZ CLN MEGD (MISCELLANEOUS) ×1 IMPLANT
ELECTRODE REM PT RTRN 9FT ADLT (ELECTROSURGICAL) ×1 IMPLANT
EVACUATOR 1/8 PVC DRAIN (DRAIN) ×3 IMPLANT
GAUZE SPONGE 4X4 12PLY STRL (GAUZE/BANDAGES/DRESSINGS) ×3 IMPLANT
GAUZE SPONGE 4X4 16PLY XRAY LF (GAUZE/BANDAGES/DRESSINGS) IMPLANT
GLOVE BIOGEL PI IND STRL 7.5 (GLOVE) ×2 IMPLANT
GLOVE BIOGEL PI INDICATOR 7.5 (GLOVE) ×4
GLOVE SS BIOGEL STRL SZ 7.5 (GLOVE) ×4 IMPLANT
GLOVE SUPERSENSE BIOGEL SZ 7.5 (GLOVE) ×8
GLOVE SURG SS PI 7.0 STRL IVOR (GLOVE) ×3 IMPLANT
GOWN STRL REUS W/ TWL LRG LVL3 (GOWN DISPOSABLE) ×1 IMPLANT
GOWN STRL REUS W/ TWL XL LVL3 (GOWN DISPOSABLE) IMPLANT
GOWN STRL REUS W/TWL LRG LVL3 (GOWN DISPOSABLE) ×2
GOWN STRL REUS W/TWL XL LVL3 (GOWN DISPOSABLE)
HEMOSTAT POWDER KIT SURGIFOAM (HEMOSTASIS) ×3 IMPLANT
KIT BASIN OR (CUSTOM PROCEDURE TRAY) ×3 IMPLANT
KIT ROOM TURNOVER OR (KITS) ×3 IMPLANT
NEEDLE HYPO 18GX1.5 BLUNT FILL (NEEDLE) ×3 IMPLANT
NEEDLE HYPO 21X1.5 SAFETY (NEEDLE) ×9 IMPLANT
NEEDLE HYPO 25X1 1.5 SAFETY (NEEDLE) IMPLANT
NS IRRIG 1000ML POUR BTL (IV SOLUTION) ×3 IMPLANT
OIL CARTRIDGE MAESTRO DRILL (MISCELLANEOUS) ×3
PACK LAMINECTOMY NEURO (CUSTOM PROCEDURE TRAY) ×3 IMPLANT
PACK UNIVERSAL I (CUSTOM PROCEDURE TRAY) ×3 IMPLANT
PAD ARMBOARD 7.5X6 YLW CONV (MISCELLANEOUS) ×9 IMPLANT
PATTIES SURGICAL .5X1.5 (GAUZE/BANDAGES/DRESSINGS) ×3 IMPLANT
RUBBERBAND STERILE (MISCELLANEOUS) ×6 IMPLANT
SPONGE SURGIFOAM ABS GEL SZ50 (HEMOSTASIS) ×3 IMPLANT
STRIP CLOSURE SKIN 1/2X4 (GAUZE/BANDAGES/DRESSINGS) IMPLANT
SUT STRATAFIX MNCRL+ 3-0 PS-2 (SUTURE) ×1
SUT STRATAFIX MONOCRYL 3-0 (SUTURE) ×2
SUT STRATAFIX SPIRAL + 2-0 (SUTURE) ×3 IMPLANT
SUT VIC AB 0 CT1 18XCR BRD8 (SUTURE) ×3 IMPLANT
SUT VIC AB 0 CT1 8-18 (SUTURE) ×6
SUT VIC AB 2-0 CT1 18 (SUTURE) ×9 IMPLANT
SUT VIC AB 4-0 PS2 27 (SUTURE) IMPLANT
SUTURE STRATFX MNCRL+ 3-0 PS-2 (SUTURE) ×1 IMPLANT
SYR 30ML LL (SYRINGE) ×9 IMPLANT
SYR 5ML LL (SYRINGE) IMPLANT
TOWEL OR 17X24 6PK STRL BLUE (TOWEL DISPOSABLE) ×3 IMPLANT
TOWEL OR 17X26 10 PK STRL BLUE (TOWEL DISPOSABLE) ×3 IMPLANT
TUBE CONNECTING 12'X1/4 (SUCTIONS)
TUBE CONNECTING 12X1/4 (SUCTIONS) IMPLANT
WATER STERILE IRR 1000ML POUR (IV SOLUTION) ×3 IMPLANT

## 2016-02-09 NOTE — H&P (Signed)
CC: Back and leg pain No chief complaint on file.   HPI: Carlos Obrien is a 82 y.o. male with lumbosacral spondylosis and neurogenic claudication presents with low back pain radiating into his legs.  He complains of pain that is worse with walking, it usually gets better with rest.    PMH: Past Medical History:  Diagnosis Date  . Anemia    hx  . Bilateral lower extremity edema   . Bladder calculi   . BPH (benign prostatic hyperplasia)   . CKD (chronic kidney disease)   . Coronary artery disease    cardiologist-- dr Loney Laurence [facility])  lov note in care everywhere tab in epic  . DDD (degenerative disc disease), lumbar   . Diabetes mellitus without complication ([place])   . Diastolic CHF, chronic ([place])   . Dyspnea    exersion  . Dysrhythmia    atrial fib  . GERD (gastroesophageal reflux disease)   . History of adenomatous polyp of colon    2012  . History of Barrett's esophagus   . History of basal cell carcinoma excision    Sept 2016  --scalp  . History of colon cancer    1999  adenocarcinoma polyp  . History of hiatal hernia   . History of kidney stones   . History of kidney stones   . History of MI (myocardial infarction)    1996  . Hyperlipidemia   . Hypertension   . PAF (paroxysmal atrial fibrillation) ([place])    dx 2015  . Prostate cancer ([place])   . Urethral stricture   . Urinary incontinence     PSH: Past Surgical History:  Procedure Laterality Date  . BLEPHAROPLASTY Bilateral Fall 2015  . CORONARY ANGIOPLASTY  May 1996  dr fath [facility] date verified w/ medical records)   per cardiologist note in epic (dr Loney Laurence)  2 vessel CAD-- occluded RCA and 60% LAD  . CYSTOSCOPY WITH HOLMIUM LASER LITHOTRIPSY N/A 01/08/2015   Procedure: CYSTOSCOPY WITH REMOVAL OF BLADDER STONE;  Surgeon: Rana Snare, MD;  Location: [facility];  Service: Urology;  Laterality: N/A;  . CYSTOSCOPY WITH RETROGRADE PYELOGRAM, URETEROSCOPY AND STENT PLACEMENT  Aug 2015  .  CYSTOSCOPY WITH URETHRAL DILATATION N/A 01/08/2015   Procedure: CYSTOSCOPY WITH URETHRAL DILATATION;  Surgeon: Rana Snare, MD;  Location: [facility];  Service: Urology;  Laterality: N/A;  . PARTIAL KNEE ARTHROPLASTY Right 11-08-2012  . TRANSTHORACIC ECHOCARDIOGRAM  10-22-2013   mild concentric LVH,  grade 1 diastolic dysfunction,  ef 50-55%,  mild MR and TR,  trivial PR    SH: Social History  Substance Use Topics  . Smoking status: Never Smoker  . Smokeless tobacco: Never Used  . Alcohol use No    MEDS: Prior to Admission medications   Medication Sig Start Date End Date Taking? Authorizing Provider  acetaminophen (TYLENOL) 500 MG tablet Take 500 mg by mouth every 6 (six) hours as needed.   Yes Historical Provider, MD  apixaban (ELIQUIS) 5 MG TABS tablet Take 5 mg by mouth 2 (two) times daily.   Yes Historical Provider, MD  aspirin 325 MG tablet Take 325 mg by mouth daily.   Yes Historical Provider, MD  atenolol (TENORMIN) 25 MG tablet Take 50 mg by mouth every morning.    Yes Historical Provider, MD  atorvastatin (LIPITOR) 20 MG tablet Take 20 mg by mouth every evening.    Yes Historical Provider, MD  celecoxib (CELEBREX) 200 MG capsule Take 200 mg  by mouth 2 (two) times daily.   Yes Historical Provider, MD  Cholecalciferol (VITAMIN D) 2000 units tablet Take 2,000 Units by mouth daily.   Yes Historical Provider, MD  furosemide (LASIX) 40 MG tablet Take 40 mg by mouth every morning.    Yes Historical Provider, MD  gabapentin (NEURONTIN) 100 MG capsule Take 100 mg by mouth at bedtime.   Yes Historical Provider, MD  glipiZIDE (GLUCOTROL) 10 MG tablet Take 20 mg by mouth daily before breakfast.   Yes Historical Provider, MD  Insulin Glargine (LANTUS SOLOSTAR) 100 UNIT/ML Solostar Pen Inject 20 Units into the skin at bedtime.  03/31/15  Yes Historical Provider, MD  isosorbide mononitrate (IMDUR) 30 MG 24 hr tablet Take 30 mg by mouth every morning.    Yes Historical Provider,  MD  Liraglutide (VICTOZA) 18 MG/3ML SOPN Inject 1.8 mg into the skin every morning.    Yes Historical Provider, MD  magnesium oxide (MAG-OX) 400 MG tablet Take 400 mg by mouth every evening.    Yes Historical Provider, MD  metFORMIN (GLUCOPHAGE) 500 MG tablet Take 1,000 mg by mouth 2 (two) times daily with a meal.    Yes Historical Provider, MD  nitroGLYCERIN (NITROSTAT) 0.4 MG SL tablet Place 0.4 mg under the tongue every 5 (five) minutes as needed for chest pain.   Yes Historical Provider, MD  omeprazole (PRILOSEC) 40 MG capsule Take 40 mg by mouth every morning.    Yes Historical Provider, MD  pioglitazone (ACTOS) 30 MG tablet Take 30 mg by mouth every morning.    Yes Historical Provider, MD  ramipril (ALTACE) 2.5 MG capsule Take 2.5 mg by mouth every morning.    Yes Historical Provider, MD  silodosin (RAPAFLO) 8 MG CAPS capsule Take 8 mg by mouth daily with breakfast.   Yes Historical Provider, MD  vitamin B-12 (CYANOCOBALAMIN) 1000 MCG tablet Take 1,000 mcg by mouth daily.   Yes Historical Provider, MD  aspirin EC 81 MG tablet Take 81 mg by mouth daily. Started 01/29/16    Historical Provider, MD    ALLERGY: Allergies  Allergen Reactions  . No Known Allergies     ROS: ROS  NEUROLOGIC EXAM: Awake, alert, oriented Memory and concentration grossly intact Speech fluent, appropriate CN grossly intact Motor exam: Upper Extremities Deltoid Bicep Tricep Grip  Right 5/5 5/5 5/5 5/5  Left 5/5 5/5 5/5 5/5   Lower Extremity IP Quad PF DF EHL  Right 5/5 5/5 5/5 5/5 5/5  Left 5/5 5/5 5/5 5/5 5/5   Sensation grossly intact to LT  IMAGING: No new imaging  IMPRESSION: - 83 y.o. male with lumbosacral spondylosis with radiculopathy and neurogenic claudication.  Neurologically intact.  PLAN: - We have talked about the risks, benefits, and alternatives to surgery and he wishes to proceed. - L4-S1 laminectomy for decompression.

## 2016-02-09 NOTE — Anesthesia Postprocedure Evaluation (Signed)
Anesthesia Post Note  Patient: Carlos Obrien  Procedure(s) Performed: Procedure(s) (LRB): Lumbar four  to Sacral one Laminectomy for decompression (N/A)  Patient location during evaluation: PACU Anesthesia Type: General Level of consciousness: awake Pain management: pain level controlled Vital Signs Assessment: post-procedure vital signs reviewed and stable Respiratory status: spontaneous breathing Cardiovascular status: stable Anesthetic complications: no       Last Vitals:  Vitals:   02/09/16 1515 02/09/16 1530  BP: 136/85 138/84  Pulse: 93 89  Resp: 14 15  Temp:  36.6 C    Last Pain:  Vitals:   02/09/16 1530  TempSrc:   PainSc: 0-No pain                 Fuquan Wilson

## 2016-02-09 NOTE — Anesthesia Preprocedure Evaluation (Signed)
Anesthesia Evaluation  Patient identified by MRN, date of birth, ID band Patient awake    Reviewed: Allergy & Precautions, NPO status , Patient's Chart, lab work & pertinent test results  Airway Mallampati: II  TM Distance: >3 FB     Dental   Pulmonary shortness of breath,    breath sounds clear to auscultation       Cardiovascular hypertension, + CAD and +CHF  + dysrhythmias  Rhythm:Regular Rate:Normal     Neuro/Psych    GI/Hepatic hiatal hernia, PUD,   Endo/Other  diabetes  Renal/GU Renal disease     Musculoskeletal  (+) Arthritis ,   Abdominal   Peds  Hematology  (+) anemia ,   Anesthesia Other Findings   Reproductive/Obstetrics                             Anesthesia Physical Anesthesia Plan  ASA: III  Anesthesia Plan: General   Post-op Pain Management:    Induction: Intravenous  Airway Management Planned: Oral ETT  Additional Equipment:   Intra-op Plan:   Post-operative Plan: Extubation in OR  Informed Consent: I have reviewed the patients History and Physical, chart, labs and discussed the procedure including the risks, benefits and alternatives for the proposed anesthesia with the patient or authorized representative who has indicated his/her understanding and acceptance.   Dental advisory given  Plan Discussed with: CRNA and Anesthesiologist  Anesthesia Plan Comments:         Anesthesia Quick Evaluation

## 2016-02-09 NOTE — Op Note (Signed)
02/09/2016  2:39 PM  PATIENT:  Carlos Obrien  82 y.o. male  PRE-OPERATIVE DIAGNOSIS:  Lumbosacral spondylosis with radiculopathy  POST-OPERATIVE DIAGNOSIS:  Same  PROCEDURE:  L4-S1 laminectomy for decompression  SURGEON:  Aldean Ast, MD  ASSISTANTS: Earnie Larsson, MD  ANESTHESIA:   General  DRAINS: Medium Hemovac   SPECIMEN:  None  INDICATION FOR PROCEDURE: 82 year old male with low back and leg pain, worse with walking.  He did not do well with conservative management.  I recommended the above operation. Patient understood the risks, benefits, and alternatives and potential outcomes and wished to proceed.  PROCEDURE DETAILS: After smooth induction of general endotracheal anesthesia the patient was turned prone on the operating table on a Wilson frame. The skin of the lumbar region was clipped of hair. It was wiped down with alcohol. The patient was then prepped and draped in usual sterile fashion.   The subcutaneous tissues of the midline from approximately L4 to S1 was infiltrated with Marcaine. The skin in this area was opened sharply. The soft tissues were dissected with monopolar cautery. Subperiosteal dissection was carried out along the sides of the spinous processes and the laminar surfaces to the medial edge of the facet joints from L4 to S1. The spinous processes were removed with rongeurs. The laminae were then thinned with a high-speed bur. The remaining lamina was resected with a Kerrison punch. Thickened ligamentum flavum was separated from the dura and resected with a Kerrison rongeur. The thecal sac was further freed from the hypertrophied ligament in the lateral recesses.  Lateral recess decompression was completed. Decompression was carried out laterally to the foramina. The foramina were palpated and found to be without residual stenosis.   I irrigated vigorously with bacitracin saline. There was excellent hemostasis. A medium hemovac drain was placed below the  fascia. I placed vancomycin powder in the depths of the wounds.The wound was then closed in routine anatomic layers with interrupted vicryl suture. I injected exparel in the paraspinous muscles. The skin was closed with a running Monocryl strata fix suture. It was then sealed with Dermabond. The patient was turned to the supine position and allowed to awaken from anesthesia.   PATIENT DISPOSITION:  PACU - hemodynamically stable.   Delay start of Pharmacological VTE agent (>24hrs) due to surgical blood loss or risk of bleeding:  yes

## 2016-02-09 NOTE — Transfer of Care (Signed)
Immediate Anesthesia Transfer of Care Note  Patient: Carlos Obrien  Procedure(s) Performed: Procedure(s) with comments: Lumbar four  to Sacral one Laminectomy for decompression (N/A) - L4 to S1 Laminectomy for decompression  Patient Location: PACU  Anesthesia Type:General  Level of Consciousness: awake and alert   Airway & Oxygen Therapy: Patient Spontanous Breathing and Patient connected to face mask oxygen  Post-op Assessment: Report given to RN and Post -op Vital signs reviewed and stable  Post vital signs: Reviewed and stable  Last Vitals:  Vitals:   02/09/16 0957 02/09/16 1500  BP: (!) 146/89 (!) 142/99  Pulse: 82 95  Resp: 18 15  Temp: 36.4 C 36.8 C    Last Pain:  Vitals:   02/09/16 1500  TempSrc:   PainSc: (P) 0-No pain         Complications: No apparent anesthesia complications

## 2016-02-09 NOTE — Progress Notes (Signed)
Pt received at this time from PACU with no noted distress. Surgical honeycomb dressing clean, dry and intact. Hemovac in place. Pt due to void. Pt oriented to room. Safety measures in place. Family at bedside. Call bell within reach. Will continue to monitor.

## 2016-02-09 NOTE — Anesthesia Procedure Notes (Signed)
Procedure Name: Intubation Date/Time: 02/09/2016 12:53 PM Performed by: Ollen Bowl Pre-anesthesia Checklist: Patient identified, Emergency Drugs available, Suction available, Patient being monitored and Timeout performed Patient Re-evaluated:Patient Re-evaluated prior to inductionOxygen Delivery Method: Circle system utilized and Simple face mask Preoxygenation: Pre-oxygenation with 100% oxygen Intubation Type: IV induction Ventilation: Mask ventilation with difficulty and Oral airway inserted - appropriate to patient size Laryngoscope Size: Glidescope and 3 Tube type: Oral Tube size: 7.5 mm Number of attempts: 1 Airway Equipment and Method: Patient positioned with wedge pillow,  Stylet and Video-laryngoscopy Placement Confirmation: ETT inserted through vocal cords under direct vision and positive ETCO2 Secured at: 23 cm Tube secured with: Tape Dental Injury: Teeth and Oropharynx as per pre-operative assessment

## 2016-02-10 ENCOUNTER — Encounter (HOSPITAL_COMMUNITY): Payer: Self-pay | Admitting: Neurological Surgery

## 2016-02-10 DIAGNOSIS — M4727 Other spondylosis with radiculopathy, lumbosacral region: Secondary | ICD-10-CM | POA: Diagnosis not present

## 2016-02-10 LAB — GLUCOSE, CAPILLARY
Glucose-Capillary: 157 mg/dL — ABNORMAL HIGH (ref 65–99)
Glucose-Capillary: 159 mg/dL — ABNORMAL HIGH (ref 65–99)

## 2016-02-10 MED ORDER — METHOCARBAMOL 750 MG PO TABS: 750.0000 mg | ORAL_TABLET | Freq: Three times a day (TID) | ORAL | 1 refills | Status: DC | PRN

## 2016-02-10 MED ORDER — APIXABAN 5 MG PO TABS: 5.0000 mg | ORAL_TABLET | Freq: Two times a day (BID) | ORAL | 0 refills | Status: AC

## 2016-02-10 MED ORDER — OXYCODONE-ACETAMINOPHEN 7.5-325 MG PO TABS: ORAL_TABLET | ORAL | 0 refills | Status: DC

## 2016-02-10 NOTE — Care Management Note (Signed)
Case Management Note  Patient Details  Name: Carlos Obrien MRN: [id number] Date of Birth: Nov 10, 1934  Subjective/Objective:                    Action/Plan: Pt discharging home with orders for walker. CM met with the patient and he is requesting a tall walker d/t his height. CM notified Brad with [facility] and he will deliver the equipment to the room. Pt has transportation home.    Expected Discharge Date:  02/10/16               Expected Discharge Plan:  Home/Self Care  In-House Referral:     Discharge planning Services  CM Consult  Post Acute Care Choice:  Durable Medical Equipment Choice offered to:  Patient  DME Arranged:  Gilford Rile rolling DME Agency:  [place]:    [place]:     Status of Service:  Completed, signed off  If discussed at [place] of Stay Meetings, dates discussed:    Additional Comments:  Pollie Friar, RN 02/10/2016, 11:12 AM

## 2016-02-10 NOTE — Evaluation (Signed)
Occupational Therapy Evaluation Patient Details Name: Carlos Obrien MRN: [id number] DOB: Jun 24, 1934 Today's Date: 02/10/2016    History of Present Illness Patient is a 82 y/o male admitted with history of back pain with neurogenic claudication.  Now s/p L4-S1 laminectomy for decompression.  PMH significant for DM, a-fib, h/o MI and prostate cancer.    Clinical Impression   Pt admitted with above. He demonstrates the below listed deficits and will benefit from continued OT to maximize safety and independence with BADLs. Pt presents to OT with generalized weakness, decreased knowledge of precautions, and pain.  He is able to perform ADLs with min A, overall, and supervision for functional transfers.  Wife very supportive.       Follow Up Recommendations  No OT follow up;Supervision/Assistance - 24 hour    Equipment Recommendations  Tub/shower seat (wife will acquire )    Recommendations for Other Services       Precautions / Restrictions Precautions Precautions: Back Precaution Booklet Issued: Yes (comment) Precaution Comments: Pt able to verbalize back precautions, but requires min cues to adhere to them during activity  Required Braces or Orthoses:  (no brace)      Mobility Bed Mobility Overal bed mobility: Needs Assistance Bed Mobility: Rolling;Sidelying to Sit Rolling: Supervision Sidelying to sit: Supervision     Sit to sidelying: Supervision General bed mobility comments: Pt required cues for log rolling   Transfers Overall transfer level: Needs assistance Equipment used: Rolling walker (2 wheeled) Transfers: Sit to/from [facility] Sit to Stand: Supervision Stand pivot transfers: Supervision       General transfer comment: cues for hand placement     Balance Overall balance assessment: No apparent balance deficits (not formally assessed)                                          ADL Overall ADL's : Needs  assistance/impaired Eating/Feeding: Independent   Grooming: Wash/dry hands;Wash/dry face;Applying deodorant;Brushing hair;Supervision/safety;Standing Grooming Details (indicate cue type and reason): Pt instructed to use cup during grooming and was instructed to avoid bending while shaving  Upper Body Bathing: Set up;Supervision/ safety;Sitting   Lower Body Bathing: Supervison/ safety;Sit to/from stand   Upper Body Dressing : Set up;Supervision/safety;Sitting   Lower Body Dressing: Sit to/from stand;Minimal assistance Lower Body Dressing Details (indicate cue type and reason): Pt unable to access Lt foot by crossing ankles over hip (is able to access Rt foot).  Wife reports she will assist him at discharge with socks.  Discussed use of Long handled shoe horn for shoes, and instructed them in use of elastic shoe laces  Toilet Transfer: Supervision/safety;Ambulation;Comfort height toilet;Grab bars;BSC;RW Toilet Transfer Details (indicate cue type and reason): Pt cautioned against pulling up on grab bar that is in front of his commode.  discussed option of 3in1, but he prefers to not use it  Writer and Hygiene: Supervision/safety;Sit to/from Nurse, children's Details (indicate cue type and reason): discussed need for shower seat at home, and discussed option of using 3in1 commode, but both pt and wife would prefer to purchase a regular shower seat  Functional mobility during ADLs: Supervision/safety;Rolling walker General ADL Comments: Wife very supportive      Vision     Perception     Praxis      Pertinent Vitals/Pain Pain Assessment: Faces Faces Pain Scale: Hurts a  little bit Pain Location: at drain site Pain Descriptors / Indicators: Discomfort Pain Intervention(s): Monitored during session     Hand Dominance Right   Extremity/Trunk Assessment Upper Extremity Assessment Upper Extremity Assessment: Overall WFL for tasks assessed   Lower  Extremity Assessment Lower Extremity Assessment: Defer to PT evaluation       Communication Communication Communication: No difficulties   Cognition Arousal/Alertness: Awake/alert Behavior During Therapy: WFL for tasks assessed/performed Overall Cognitive Status: Within Functional Limits for tasks assessed                     General Comments       Exercises       Shoulder Instructions      Home Living Family/patient expects to be discharged to:: Private residence Living Arrangements: Spouse/significant other Available Help at Discharge: Family;Available 24 hours/day Type of Home: House Home Access: Stairs to enter [employer] of Steps: 3 Entrance Stairs-Rails: Right Home Layout: One level     Bathroom Shower/Tub: Occupational psychologist: Handicapped height Bathroom Accessibility: Yes   Home Equipment: Cane - single point;Grab bars - tub/shower;Grab bars - toilet;Adaptive equipment Adaptive Equipment: Reacher        Prior Functioning/Environment Level of Independence: Independent        Comments: with exception of wife intermittently assisting with his socks         OT Problem List: Decreased strength;Decreased activity tolerance;Impaired balance (sitting and/or standing);Decreased safety awareness;Decreased knowledge of use of DME or AE;Decreased knowledge of precautions;Obesity;Pain   OT Treatment/Interventions: Self-care/ADL training;DME and/or AE instruction;Therapeutic activities;Patient/family education    OT Goals(Current goals can be found in the care plan section) Acute Rehab OT Goals Patient Stated Goal: to go home  OT Goal Formulation: With patient/family Time For Goal Achievement: 02/11/16 Potential to Achieve Goals: Good ADL Goals Pt Will Perform Lower Body Dressing: with modified independence;sit to/from stand;with adaptive equipment Pt Will Transfer to Toilet: with modified independence;ambulating;regular height  toilet;grab bars Pt Will Perform Toileting - Clothing Manipulation and hygiene: with modified independence;sit to/from stand  OT Frequency: Min 2X/week   Barriers to D/C:            Co-evaluation              End of Session Equipment Utilized During Treatment: Rolling walker Nurse Communication: Mobility status  Activity Tolerance: Patient tolerated treatment well Patient left: in bed;with call bell/phone within reach;with family/visitor present   Time: 1127-1159 OT Time Calculation (min): 32 min Charges:  OT General Charges $OT Visit: 1 Procedure OT Evaluation $OT Eval Low Complexity: 1 Procedure OT Treatments $Self Care/Home Management : 8-22 mins G-Codes: OT G-codes **NOT FOR INPATIENT CLASS** Functional Assessment Tool Used: clinical judgement  Functional Limitation: Self care Self Care Current Status (M3536): At least 1 percent but less than 20 percent impaired, limited or restricted Self Care Goal Status (R4431): At least 1 percent but less than 20 percent impaired, limited or restricted Self Care Discharge Status [phone]): At least 1 percent but less than 20 percent impaired, limited or restricted  Leolia Vinzant M 02/10/2016, 1:03 PM

## 2016-02-10 NOTE — Progress Notes (Signed)
Patient is discharged from room 5C12 at this time. Alert and in stable condition. IV site d/c'd and instructions read to patient and wife with understanding verbalized. Left unit via wheelchair with wife and all belongings at side.

## 2016-02-10 NOTE — Progress Notes (Signed)
Occupational Therapy Progress note  Reviewed best technique for LB ADLs.  Pt with good carry over of info from previous session.  He is eager to discharge home.     02/10/16 1302  OT Visit Information  Assistance Needed +1  History of Present Illness Patient is a 82 y/o male admitted with history of back pain with neurogenic claudication.  Now s/p L4-S1 laminectomy for decompression.  PMH significant for DM, a-fib, h/o MI and prostate cancer.   Precautions  Precautions Back  Precaution Booklet Issued Yes (comment)  Precaution Comments Pt able to verbalize back precautions, but requires min cues to adhere to them during activity   Pain Assessment  Pain Assessment Faces  Faces Pain Scale 2  Pain Location at drain site  Pain Descriptors / Indicators Discomfort  Cognition  Arousal/Alertness Awake/alert  Behavior During Therapy WFL for tasks assessed/performed  Overall Cognitive Status Within Functional Limits for tasks assessed  ADL  Overall ADL's  Needs assistance/impaired  Eating/Feeding Independent  Upper Body Dressing  Set up;Supervision/safety;Sitting  Lower Body Dressing Sit to/from stand;Minimal assistance  Lower Body Dressing Details (indicate cue type and reason) Pt instructed to use reacher to don [place].  he is able to safely don pants without Optician, dispensing Supervision/safety;Ambulation;Comfort height toilet;Grab bars;BSC;RW  Toilet Transfer Details (indicate cue type and reason) Pt able to demonstrate good safety awareness with toilet transfers   [place];Sit to/from stand  Functional mobility during ADLs Supervision/safety;Rolling walker  Bed Mobility  Overal bed mobility Needs Assistance  Bed Mobility Rolling;Sidelying to Sit  Rolling Supervision  Sit to sidelying Supervision  Balance  Overall balance assessment No apparent balance deficits (not formally assessed)  Transfers  Overall transfer level  Needs assistance  Equipment used Rolling walker (2 wheeled)  Transfers Sit to/from [employer] Transfers  Sit to Stand Supervision  Stand pivot transfers Supervision  General transfer comment cues for hand placement   OT - End of Session  Equipment Utilized During Treatment Rolling walker  Activity Tolerance Patient tolerated treatment well  Patient left in bed;with call bell/phone within reach;with family/visitor present  Nurse Communication Mobility status  OT Assessment/Plan  OT Frequency (ACUTE ONLY) Min 2X/week  Follow Up Recommendations No OT follow up;Supervision/Assistance - 24 hour  OT Equipment Tub/shower seat (wife will acquire )  Acute Rehab OT Goals  Patient Stated Goal to go home   OT Goal Formulation With patient/family  Time For Goal Achievement 02/11/16  Potential to Achieve Goals Good  ADL Goals  Pt Will Perform Lower Body Dressing with modified independence;sit to/from stand;with adaptive equipment  Pt Will Transfer to Toilet with modified independence;ambulating;regular height toilet;grab bars  Pt Will Perform Toileting - Clothing Manipulation and hygiene with modified independence;sit to/from stand  OT Time Calculation  OT Start Time (ACUTE ONLY) 1217  OT Stop Time (ACUTE ONLY) 1232  OT Time Calculation (min) 15 min  OT General Charges  $OT Visit 1 Procedure  OT Treatments  $Self Care/Home Management  8-22 mins  [facility], OTR/L [phone]

## 2016-02-10 NOTE — Evaluation (Signed)
Physical Therapy Evaluation & Discharge Patient Details Name: Carlos Obrien MRN: [id number] DOB: 12-26-1934 Today's Date: 02/10/2016   History of Present Illness  Patient is a 82 y/o male admitted with history of back pain with neurogenic claudication.  Now s/p L4-S1 laminectomy for decompression.  PMH significant for DM, a-fib, h/o MI and prostate cancer.   Clinical Impression  Patient presents with mobility at supervision level.  Back education was completed and wife present as well for education.  Able to ambulate safely with RW and encouraged to use initially for safety.  No further skilled PT needs as education completed and planned d/c later today.      Follow Up Recommendations No PT follow up    Equipment Recommendations  Rolling walker with 5" wheels    Recommendations for Other Services       Precautions / Restrictions Precautions Precautions: Back Precaution Booklet Issued: Yes (comment) Precaution Comments: educated in precautions with handout Required Braces or Orthoses:  (no brace) Restrictions Weight Bearing Restrictions: No      Mobility  Bed Mobility Overal bed mobility: Needs Assistance Bed Mobility: Rolling;Sidelying to Sit;Sit to Sidelying Rolling: Supervision Sidelying to sit: Supervision     Sit to sidelying: Supervision General bed mobility comments: cues for technique; back to bed no rail and with HOB flat  Transfers Overall transfer level: Needs assistance Equipment used: Rolling walker (2 wheeled) Transfers: Sit to/from Stand Sit to Stand: Supervision         General transfer comment: cues for hand placement  Ambulation/Gait Ambulation/Gait assistance: Modified independent (Device/Increase time) Ambulation Distance (Feet): 200 Feet Assistive device: Rolling walker (2 wheeled) Gait Pattern/deviations: Step-through pattern;Decreased stride length;Trunk flexed     General Gait Details: no physical assist or cues needed  Stairs Stairs:  Yes Stairs assistance: Supervision Stair Management: Step to pattern;Forwards;One rail Right Number of Stairs: 4 (6" steps) General stair comments: cues for technique, not to use both rails  Wheelchair Mobility    Modified Rankin (Stroke Patients Only)       Balance Overall balance assessment: No apparent balance deficits (not formally assessed)                                           Pertinent Vitals/Pain Pain Assessment: 0-10 Pain Score: 3  Pain Location: at drain site Pain Descriptors / Indicators: Discomfort Pain Intervention(s): Monitored during session    Home Living Family/patient expects to be discharged to:: Private residence Living Arrangements: Spouse/significant other   Type of Home: House Home Access: Stairs to enter Entrance Stairs-Rails: Right Entrance Stairs-Number of Steps: 3 Home Layout: One level Home Equipment: Cane - single point;Grab bars - tub/shower;Grab bars - toilet      Prior Function Level of Independence: Independent               Hand Dominance   Dominant Hand: Right    Extremity/Trunk Assessment   Upper Extremity Assessment Upper Extremity Assessment: Defer to OT evaluation    Lower Extremity Assessment Lower Extremity Assessment: Overall WFL for tasks assessed       Communication   Communication: No difficulties  Cognition Arousal/Alertness: Awake/alert Behavior During Therapy: WFL for tasks assessed/performed Overall Cognitive Status: Within Functional Limits for tasks assessed                      General Comments  Exercises     Assessment/Plan    PT Assessment Patent does not need any further PT services  PT Problem List            PT Treatment Interventions      PT Goals (Current goals can be found in the Care Plan section)  Acute Rehab PT Goals PT Goal Formulation: All assessment and education complete, DC therapy    Frequency     Barriers to discharge         Co-evaluation               End of Session Equipment Utilized During Treatment: Gait belt   Patient left: in bed;with call bell/phone within reach;with family/visitor present      Functional Assessment Tool Used: Clinical Judgement Functional Limitation: Self care Self Care Current Status (L4562): At least 1 percent but less than 20 percent impaired, limited or restricted Self Care Goal Status (B6389): At least 1 percent but less than 20 percent impaired, limited or restricted Self Care Discharge Status [phone]): At least 1 percent but less than 20 percent impaired, limited or restricted    Time: 0851-0914 PT Time Calculation (min) (ACUTE ONLY): 23 min   Charges:   PT Evaluation $PT Eval Low Complexity: 1 Procedure PT Treatments $Self Care/Home Management: 8-22   PT G Codes:   PT G-Codes **NOT FOR INPATIENT CLASS** Functional Assessment Tool Used: Clinical Judgement Functional Limitation: Self care Self Care Current Status (A7681): At least 1 percent but less than 20 percent impaired, limited or restricted Self Care Goal Status (L5726): At least 1 percent but less than 20 percent impaired, limited or restricted Self Care Discharge Status [phone]): At least 1 percent but less than 20 percent impaired, limited or restricted    Reginia Naas 02/10/2016, 11:52 AM  Magda Kiel, [place] 02/10/2016

## 2016-02-10 NOTE — Discharge Summary (Signed)
Date of Admission: 02/09/2016  Date of Discharge: 02/10/16  PRE-OPERATIVE DIAGNOSIS:  Lumbosacral spondylosis with radiculopathy  POST-OPERATIVE DIAGNOSIS:  Same  PROCEDURE:  L4-S1 laminectomy for decompression  Attending: Kevan Ny Ditty, MD  Hospital Course:  The patient was admitted for the above listed operation and had an uncomplicated post-operative course.  They were discharged in stable condition. Moving all extremities. Sensation normal. Strength appropriate.   Follow up: 3 weeks  Allergies as of 02/10/2016      Reactions   No Known Allergies       Medication List    TAKE these medications   apixaban 5 MG Tabs tablet Commonly known as:  ELIQUIS Take 1 tablet (5 mg total) by mouth 2 (two) times daily. Start taking on:  02/13/2016   aspirin 325 MG tablet Take 325 mg by mouth daily. What changed:  Another medication with the same name was removed. Continue taking this medication, and follow the directions you see here.   atenolol 25 MG tablet Commonly known as:  TENORMIN Take 50 mg by mouth every morning.   atorvastatin 20 MG tablet Commonly known as:  LIPITOR Take 20 mg by mouth every evening.   celecoxib 200 MG capsule Commonly known as:  CELEBREX Take 200 mg by mouth 2 (two) times daily.   furosemide 40 MG tablet Commonly known as:  LASIX Take 40 mg by mouth every morning.   gabapentin 100 MG capsule Commonly known as:  NEURONTIN Take 100 mg by mouth at bedtime.   glipiZIDE 10 MG tablet Commonly known as:  GLUCOTROL Take 20 mg by mouth daily before breakfast.   isosorbide mononitrate 30 MG 24 hr tablet Commonly known as:  IMDUR Take 30 mg by mouth every morning.   LANTUS SOLOSTAR 100 UNIT/ML Solostar Pen Generic drug:  Insulin Glargine Inject 20 Units into the skin at bedtime.   magnesium oxide 400 MG tablet Commonly known as:  MAG-OX Take 400 mg by mouth every evening.   metFORMIN 500 MG tablet Commonly known as:  GLUCOPHAGE Take  1,000 mg by mouth 2 (two) times daily with a meal.   methocarbamol 750 MG tablet Commonly known as:  ROBAXIN-750 Take 1 tablet (750 mg total) by mouth 3 (three) times daily as needed for muscle spasms.   nitroGLYCERIN 0.4 MG SL tablet Commonly known as:  NITROSTAT Place 0.4 mg under the tongue every 5 (five) minutes as needed for chest pain.   omeprazole 40 MG capsule Commonly known as:  PRILOSEC Take 40 mg by mouth every morning.   oxyCODONE-acetaminophen 7.5-325 MG tablet Commonly known as:  PERCOCET Take 1-2 tablets by mouth as needed for pain.   pioglitazone 30 MG tablet Commonly known as:  ACTOS Take 30 mg by mouth every morning.   ramipril 2.5 MG capsule Commonly known as:  ALTACE Take 2.5 mg by mouth every morning.   silodosin 8 MG Caps capsule Commonly known as:  RAPAFLO Take 8 mg by mouth daily with breakfast.   VICTOZA 18 MG/3ML Sopn Generic drug:  liraglutide Inject 1.8 mg into the skin every morning.   vitamin B-12 1000 MCG tablet Commonly known as:  CYANOCOBALAMIN Take 1,000 mcg by mouth daily.   Vitamin D 2000 units tablet Take 2,000 Units by mouth daily.        SignedTraci Sermon 02/10/2016, 8:03 AM

## 2016-02-10 NOTE — Progress Notes (Signed)
Ambulated in hallway with walker. Tolerated well.

## 2016-05-28 ENCOUNTER — Other Ambulatory Visit (INDEPENDENT_AMBULATORY_CARE_PROVIDER_SITE_OTHER): Payer: Self-pay | Admitting: Vascular Surgery

## 2016-05-28 DIAGNOSIS — I70219 Atherosclerosis of native arteries of extremities with intermittent claudication, unspecified extremity: Secondary | ICD-10-CM

## 2016-06-01 ENCOUNTER — Encounter (INDEPENDENT_AMBULATORY_CARE_PROVIDER_SITE_OTHER): Payer: Medicare Other

## 2016-06-01 ENCOUNTER — Ambulatory Visit (INDEPENDENT_AMBULATORY_CARE_PROVIDER_SITE_OTHER): Payer: Medicare Other | Admitting: Vascular Surgery

## 2016-12-14 ENCOUNTER — Ambulatory Visit (INDEPENDENT_AMBULATORY_CARE_PROVIDER_SITE_OTHER): Payer: Medicare Other | Admitting: Vascular Surgery

## 2016-12-14 ENCOUNTER — Encounter (INDEPENDENT_AMBULATORY_CARE_PROVIDER_SITE_OTHER): Payer: Medicare Other

## 2017-01-18 ENCOUNTER — Ambulatory Visit (INDEPENDENT_AMBULATORY_CARE_PROVIDER_SITE_OTHER): Payer: Medicare Other | Admitting: Vascular Surgery

## 2017-01-18 ENCOUNTER — Encounter (INDEPENDENT_AMBULATORY_CARE_PROVIDER_SITE_OTHER): Payer: Self-pay | Admitting: Vascular Surgery

## 2017-01-18 ENCOUNTER — Ambulatory Visit (INDEPENDENT_AMBULATORY_CARE_PROVIDER_SITE_OTHER): Payer: Medicare Other

## 2017-01-18 ENCOUNTER — Encounter (INDEPENDENT_AMBULATORY_CARE_PROVIDER_SITE_OTHER): Payer: Medicare Other

## 2017-01-18 VITALS — BP 146/85 | HR 69 | Resp 16 | Ht 75.0 in | Wt 295.0 lb

## 2017-01-18 DIAGNOSIS — I70219 Atherosclerosis of native arteries of extremities with intermittent claudication, unspecified extremity: Secondary | ICD-10-CM | POA: Diagnosis not present

## 2017-01-18 DIAGNOSIS — E785 Hyperlipidemia, unspecified: Secondary | ICD-10-CM | POA: Diagnosis not present

## 2017-01-18 DIAGNOSIS — I10 Essential (primary) hypertension: Secondary | ICD-10-CM

## 2017-01-18 DIAGNOSIS — I739 Peripheral vascular disease, unspecified: Secondary | ICD-10-CM | POA: Insufficient documentation

## 2017-01-18 NOTE — Assessment & Plan Note (Signed)
lipid control important in reducing the progression of atherosclerotic disease. Continue statin therapy  

## 2017-01-18 NOTE — Assessment & Plan Note (Signed)
His ABIs today remain good at 0.91 on the right and 1.24 on the left with good waveforms and normal digital pressures bilaterally.  Medial calcifications present, but no flow limitation at current.  Continue current medical regimen including aspirin and statin agent.  Recheck in 2 years

## 2017-01-18 NOTE — Progress Notes (Signed)
MRN : [id number]  Carlos Obrien is a 83 y.o. ([birth year]) male who presents with chief complaint of  Chief Complaint  Patient presents with  . Follow-up    1 year ABI  .  History of Present Illness: Patient returns today in follow up of PAD.  This was last checked a little under 2 years ago.  He has some mild claudication symptoms but nothing lifestyle limiting.  He has some arthritic symptoms as well.  His ABIs today remain good at 0.91 on the right and 1.24 on the left with good waveforms and normal digital pressures bilaterally.  Current Outpatient Medications  Medication Sig Dispense Refill  . apixaban (ELIQUIS) 5 MG TABS tablet Take 1 tablet (5 mg total) by mouth 2 (two) times daily. 60 tablet 0  . aspirin 325 MG tablet Take 325 mg by mouth daily.    Marland Kitchen atenolol (TENORMIN) 25 MG tablet Take 50 mg by mouth every morning.     Marland Kitchen atorvastatin (LIPITOR) 20 MG tablet Take 20 mg by mouth every evening.     . celecoxib (CELEBREX) 200 MG capsule Take 200 mg by mouth 2 (two) times daily.    . Cholecalciferol (VITAMIN D) 2000 units tablet Take 2,000 Units by mouth daily.    Marland Kitchen desvenlafaxine (PRISTIQ) 50 MG 24 hr tablet Take by mouth.    . furosemide (LASIX) 40 MG tablet Take 40 mg by mouth every morning.     . gabapentin (NEURONTIN) 100 MG capsule Take 100 mg by mouth at bedtime.    Marland Kitchen glipiZIDE (GLUCOTROL) 10 MG tablet Take 20 mg by mouth daily before breakfast.    . Insulin Glargine (LANTUS SOLOSTAR) 100 UNIT/ML Solostar Pen Inject 20 Units into the skin at bedtime.     . isosorbide mononitrate (IMDUR) 30 MG 24 hr tablet Take 30 mg by mouth every morning.     . Liraglutide (VICTOZA) 18 MG/3ML SOPN Inject 1.8 mg into the skin every morning.     . magnesium oxide (MAG-OX) 400 MG tablet Take 400 mg by mouth every evening.     . metFORMIN (GLUCOPHAGE) 500 MG tablet Take 1,000 mg by mouth 2 (two) times daily with a meal.     . methocarbamol (ROBAXIN-750) 750 MG tablet Take 1 tablet (750 mg  total) by mouth 3 (three) times daily as needed for muscle spasms. 90 tablet 1  . nitroGLYCERIN (NITROSTAT) 0.4 MG SL tablet Place 0.4 mg under the tongue every 5 (five) minutes as needed for chest pain.    Marland Kitchen omeprazole (PRILOSEC) 40 MG capsule Take 40 mg by mouth every morning.     Marland Kitchen oxyCODONE-acetaminophen (PERCOCET) 7.5-325 MG tablet Take 1-2 tablets by mouth as needed for pain. 60 tablet 0  . pioglitazone (ACTOS) 30 MG tablet Take 30 mg by mouth every morning.     . ramipril (ALTACE) 2.5 MG capsule Take 2.5 mg by mouth every morning.     . silodosin (RAPAFLO) 8 MG CAPS capsule Take 8 mg by mouth daily with breakfast.    . vitamin B-12 (CYANOCOBALAMIN) 1000 MCG tablet Take 1,000 mcg by mouth daily.     No current facility-administered medications for this visit.     Past Medical History:  Diagnosis Date  . Anemia    hx  . Bilateral lower extremity edema   . Bladder calculi   . BPH (benign prostatic hyperplasia)   . CKD (chronic kidney disease)   . Coronary artery disease  cardiologist-- dr Loney Laurence [facility] clinic)  lov note in care everywhere tab in epic  . DDD (degenerative disc disease), lumbar   . Diabetes mellitus without complication ([place])   . Diastolic CHF, chronic ([place])   . Dyspnea    exersion  . Dysrhythmia    atrial fib  . GERD (gastroesophageal reflux disease)   . History of adenomatous polyp of colon    2012  . History of Barrett's esophagus   . History of basal cell carcinoma excision    Sept 2016  --scalp  . History of colon cancer    1999  adenocarcinoma polyp  . History of hiatal hernia   . History of kidney stones   . History of kidney stones   . History of MI (myocardial infarction)    1996  . Hyperlipidemia   . Hypertension   . PAF (paroxysmal atrial fibrillation) ([place])    dx 2015  . Prostate cancer ([place])   . Urethral stricture   . Urinary incontinence     Past Surgical History:  Procedure Laterality Date  . BLEPHAROPLASTY Bilateral Fall  2015  . CORONARY ANGIOPLASTY  May 1996  dr fath [facility] date verified w/ medical records)   per cardiologist note in epic (dr Loney Laurence)  2 vessel CAD-- occluded RCA and 60% LAD  . CYSTOSCOPY WITH HOLMIUM LASER LITHOTRIPSY N/A 01/08/2015   Procedure: CYSTOSCOPY WITH REMOVAL OF BLADDER STONE;  Surgeon: Rana Snare, MD;  Location: [facility];  Service: Urology;  Laterality: N/A;  . CYSTOSCOPY WITH RETROGRADE PYELOGRAM, URETEROSCOPY AND STENT PLACEMENT  Aug 2015  . CYSTOSCOPY WITH URETHRAL DILATATION N/A 01/08/2015   Procedure: CYSTOSCOPY WITH URETHRAL DILATATION;  Surgeon: Rana Snare, MD;  Location: [facility];  Service: Urology;  Laterality: N/A;  . LUMBAR LAMINECTOMY/DECOMPRESSION MICRODISCECTOMY N/A 02/09/2016   Procedure: Lumbar four  to Sacral one Laminectomy for decompression;  Surgeon: Kevan Ny Ditty, MD;  Location: [place];  Service: Neurosurgery;  Laterality: N/A;  L4 to S1 Laminectomy for decompression  . PARTIAL KNEE ARTHROPLASTY Right 11-08-2012  . TRANSTHORACIC ECHOCARDIOGRAM  10-22-2013   mild concentric LVH,  grade 1 diastolic dysfunction,  ef 50-55%,  mild MR and TR,  trivial PR    Social History Social History   Tobacco Use  . Smoking status: Never Smoker  . Smokeless tobacco: Never Used  Substance Use Topics  . Alcohol use: No    Alcohol/week: 0.0 oz  . Drug use: No     Family History No bleeding or clotting disorders  Allergies  Allergen Reactions  . No Known Allergies      REVIEW OF SYSTEMS (Negative unless checked)  Constitutional: [] Weight loss  [] Fever  [] Chills Cardiac: [] Chest pain   [] Chest pressure   [] Palpitations   [] Shortness of breath when laying flat   [] Shortness of breath at rest   [] Shortness of breath with exertion. Vascular:  [] Pain in legs with walking   [] Pain in legs at rest   [] Pain in legs when laying flat   [x] Claudication   [] Pain in feet when walking  [] Pain in feet at rest  [] Pain in feet when laying  flat   [] History of DVT   [] Phlebitis   [] Swelling in legs   [] Varicose veins   [] Non-healing ulcers Pulmonary:   [] Uses home oxygen   [] Productive cough   [] Hemoptysis   [] Wheeze  [] COPD   [] Asthma Neurologic:  [] Dizziness  [] Blackouts   [] Seizures   [] History of stroke   [] History  of TIA  [] Aphasia   [] Temporary blindness   [] Dysphagia   [] Weakness or numbness in arms   [] Weakness or numbness in legs Musculoskeletal:  [x] Arthritis   [] Joint swelling   [] Joint pain   [] Low back pain Hematologic:  [] Easy bruising  [] Easy bleeding   [] Hypercoagulable state   [] Anemic   Gastrointestinal:  [] Blood in stool   [] Vomiting blood  [x] Gastroesophageal reflux/heartburn   [] Abdominal pain Genitourinary:  [] Chronic kidney disease   [] Difficult urination  [] Frequent urination  [] Burning with urination   [] Hematuria Skin:  [] Rashes   [] Ulcers   [] Wounds Psychological:  [] History of anxiety   []  History of major depression.  Physical Examination  BP (!) 146/85 (BP Location: Right Arm, Patient Position: Sitting)   Pulse 69   Resp 16   Ht 6\' 3"  (1.905 m)   Wt 133.8 kg (295 lb)   BMI 36.87 kg/m  Gen:  WD/WN, NAD.  Obese Head: /AT, No temporalis wasting. Ear/Nose/Throat: Hearing grossly intact, nares w/o erythema or drainage, trachea midline Eyes: Conjunctiva clear. Sclera non-icteric Neck: Supple.  No JVD.  Pulmonary:  Good air movement, no use of accessory muscles.  Cardiac: RRR, normal S1, S2 Vascular:  Vessel Right Left  Radial Palpable Palpable                          PT Palpable  1+ palpable  DP Palpable Palpable    Musculoskeletal: M/S 5/5 throughout.  No deformity or atrophy. Neurologic: Sensation grossly intact in extremities.  Symmetrical.  Speech is fluent.  Psychiatric: Judgment intact, Mood & affect appropriate for pt's clinical situation. Dermatologic: No rashes or ulcers noted.  No cellulitis or open wounds.       Labs No results found for this or any previous visit  (from the past 2160 hour(s)).  Radiology No results found.   Assessment/Plan  Hypertension blood pressure control important in reducing the progression of atherosclerotic disease. On appropriate oral medications.   Hyperlipidemia lipid control important in reducing the progression of atherosclerotic disease. Continue statin therapy   PAD (peripheral artery disease) (HCC) His ABIs today remain good at 0.91 on the right and 1.24 on the left with good waveforms and normal digital pressures bilaterally.  Medial calcifications present, but no flow limitation at current.  Continue current medical regimen including aspirin and statin agent.  Recheck in 2 years    Leotis Pain, MD  01/18/2017 4:11 PM    This note was created with Dragon medical transcription system.  Any errors from dictation are purely unintentional

## 2017-01-18 NOTE — Assessment & Plan Note (Signed)
blood pressure control important in reducing the progression of atherosclerotic disease. On appropriate oral medications.  

## 2017-03-15 ENCOUNTER — Encounter: Payer: Self-pay | Admitting: Physical Therapy

## 2017-03-15 ENCOUNTER — Ambulatory Visit: Payer: Medicare Other | Attending: Internal Medicine | Admitting: Physical Therapy

## 2017-03-15 DIAGNOSIS — G8929 Other chronic pain: Secondary | ICD-10-CM | POA: Insufficient documentation

## 2017-03-15 DIAGNOSIS — M6281 Muscle weakness (generalized): Secondary | ICD-10-CM | POA: Insufficient documentation

## 2017-03-15 DIAGNOSIS — M545 Low back pain: Secondary | ICD-10-CM | POA: Diagnosis present

## 2017-03-15 NOTE — Therapy (Addendum)
[facility] [address] [place], [address] Phone: [phone]   Fax:  [phone]  Physical Therapy Evaluation  Patient Details  Name: Carlos Obrien MRN: [id number] Date of Birth: [birth year] Referring Provider: Denton Meek, MD   Encounter Date: 03/15/2017  PT End of Session - 03/15/17 1649    Visit Number  1    Number of Visits  17    Date for PT Re-Evaluation  05/10/17    Authorization - Visit Number  0    Authorization - Number of Visits  6    PT Start Time  0230    PT Stop Time  0327    PT Time Calculation (min)  57 min    Activity Tolerance  Patient tolerated treatment well;Patient limited by fatigue    Behavior During Therapy  [facility] for tasks assessed/performed       Past Medical History:  Diagnosis Date  . Anemia    hx  . Bilateral lower extremity edema   . Bladder calculi   . BPH (benign prostatic hyperplasia)   . CKD (chronic kidney disease)   . Coronary artery disease    cardiologist-- dr Loney Laurence [facility] clinic)  lov note in care everywhere tab in epic  . DDD (degenerative disc disease), lumbar   . Diabetes mellitus without complication ([place])   . Diastolic CHF, chronic ([place])   . Dyspnea    exersion  . Dysrhythmia    atrial fib  . GERD (gastroesophageal reflux disease)   . History of adenomatous polyp of colon    2012  . History of Barrett's esophagus   . History of basal cell carcinoma excision    Sept 2016  --scalp  . History of colon cancer    1999  adenocarcinoma polyp  . History of hiatal hernia   . History of kidney stones   . History of kidney stones   . History of MI (myocardial infarction)    1996  . Hyperlipidemia   . Hypertension   . PAF (paroxysmal atrial fibrillation) ([place])    dx 2015  . Prostate cancer ([place])   . Urethral stricture   . Urinary incontinence     Past Surgical History:  Procedure Laterality Date  . BLEPHAROPLASTY Bilateral Fall 2015  .  CORONARY ANGIOPLASTY  May 1996  dr fath [place] date verified w/ medical records)   per cardiologist note in epic (dr Loney Laurence)  2 vessel CAD-- occluded RCA and 60% LAD  . CYSTOSCOPY WITH HOLMIUM LASER LITHOTRIPSY N/A 01/08/2015   Procedure: CYSTOSCOPY WITH REMOVAL OF BLADDER STONE;  Surgeon: Rana Snare, MD;  Location[facility];  Service: Urology;  Laterality: N/A;  . CYSTOSCOPY WITH RETROGRADE PYELOGRAM, URETEROSCOPY AND STENT PLACEMENT  Aug 2015  . CYSTOSCOPY WITH URETHRAL DILATATION N/A 01/08/2015   Procedure: CYSTOSCOPY WITH URETHRAL DILATATION;  Surgeon: Rana Snare, MD;  Location: [facility];  Service: Urology;  Laterality: N/A;  . LUMBAR LAMINECTOMY/DECOMPRESSION MICRODISCECTOMY N/A 02/09/2016   Procedure: Lumbar four  to Sacral one Laminectomy for decompression;  Surgeon: Kevan Ny Ditty, MD;  Location: [place];  Service: Neurosurgery;  Laterality: N/A;  L4 to S1 Laminectomy for decompression  . PARTIAL KNEE ARTHROPLASTY Right 11-08-2012  . TRANSTHORACIC ECHOCARDIOGRAM  10-22-2013   mild concentric LVH,  grade 1 diastolic dysfunction,  ef 50-55%,  mild MR and TR,  trivial PR    There were no vitals filed for this visit.  Subjective Assessment - 03/15/17 1434    Subjective  spinal stenosis    Pertinent History  Patient is a 83 year old male that had LBP for a 2 year time period, and had surgery 02/09/16:  L4 to S1 Laminectomy for decompression. Patient reports he had pain relief for some time, but has slowly increased over the past couple months. Patient reports he has some pins and needles sensation down the lateral aspect of both legs as well as pain in the low back.  Worst pain in the past week 2/10 best 0/10. Pain is exacerbated by bending over (to shave ptreports) and prolonged walking. Patient reports his leg tingling usually only happens at rest.    Limitations  Standing;Walking;House hold activities    How long can you sit comfortably?  unlimited     How long can you stand comfortably?  20 min    How long can you walk comfortably?  10 min    Patient Stated Goals  Decrease pain; be able to walk again    Currently in Pain?  Yes    Pain Score  0-No pain    Pain Location  Back    Pain Orientation  Lower;Right;Left    Pain Descriptors / Indicators  Dull;Aching    Pain Type  Chronic pain    Pain Onset  More than a month ago    Pain Frequency  Intermittent    Aggravating Factors   bending forward and prolonged walker    Pain Relieving Factors  warm water in the shower    Effect of Pain on Daily Activities  unable to walk through a store, bend forward to shave in the sink    Multiple Pain Sites  No         ROM All lumbar motions slightly restricted with flexion being the most restricted d/t hamstring tautness Hip motions wnl except IR which is limited bilat, with soft tissue restriction with bilat hip flex  Strength Hip flex: L: 4/5 R: 4/5 Hip abd L:3+/5  R 3+/5 Hip Ext in prone: L 3+/5 R 3+/5 Hip IR: 4/5 bilat Hip ER: 3+/4 bilat bilat knee flex/ext 5/5     Special Tests/ Other Reflexes (-) slump bilat with only noted hamstring tightness bilat (-) SLR (-) Crossed SLR (+) Elys bilat  hip flexor tightness (+) 90/90 bilat (+) FABER bilat (-) Obers bilat (-) Scour bilat (-) SI cluster 5xSTS (w/o UE support) 16sec 6MWT 738ft with 3 seated rest breaks Pt unable to perform SLS without UE support  Ther-Ex -Seated piriformis stretch 4x  30sec holds (HEP) - Bridge exercise 3x 10 (HEP) -Clamshell exercise 3x 10 (HEP) -Seated hamstring stretch 4x  30sec holds (HEP)  [facility] PT Assessment - 03/15/17 0001      Assessment   Medical Diagnosis  spinal stenosis    Referring Provider  Denton Meek, MD    Onset Date/Surgical Date  02/16/16    Hand Dominance  Right    Prior Therapy  No      Balance Screen   Has the patient fallen in the past 6 months  No    Has the patient had a decrease in activity level because of a fear of  falling?   No    Is the patient reluctant to leave their home because of a fear of falling?   No      Home Environment   Living Environment  -- 3 steps to get into house w/ 1 handrail; 1  story home             Objective measurements completed on examination: See above findings.              PT Education - 03/15/17 1649    Education provided  Yes    Education Details  Patient was educated on diagnosis, anatomy and pathology involved, prognosis, role of PT, and was given an HEP, demonstrating exercise with proper form following verbal and tactile cues, and was given a paper hand out to continue exercise at home. Pt was educated on and agreed to plan of care.    Person(s) Educated  Patient    Methods  Explanation;Demonstration;Tactile cues;Verbal cues;Handout    Comprehension  Verbal cues required;Returned demonstration;Verbalized understanding;Tactile cues required       PT Short Term Goals - 03/15/17 1659      PT SHORT TERM GOAL #1   Title  Pt will be independent with HEP in order to improve strength and balance in order to decrease fall risk and improve function at home and work.    Time  2    Period  Weeks    Status  New        PT Long Term Goals - 03/15/17 1700      PT LONG TERM GOAL #1   Title  Patient will increase FOTO score to 79 to demonstrate predicted increase in functional mobility to complete ADLs    Baseline  03/15/17 50     Time  8    Period  Weeks    Status  New      PT LONG TERM GOAL #2   Title  Pt will decrease mODI scoreby at least 13 points in order demonstrate clinically significant reduction in pain/disability    Baseline  03/15/17 32%    Time  8    Period  Weeks    Status  New      PT LONG TERM GOAL #3   Title  Pt will increase 6MWT by at least 73m (134ft) in order to demonstrate clinically significant improvement in cardiopulmonary endurance and community ambulation    Baseline  03/15/16 759ft with 3 seated rest breaks      PT LONG  TERM GOAL #4   Title  Pt will decrease 5TSTS by at least 3 seconds in order to demonstrate clinically significant improvement in LE strength    Baseline  03/15/17 16sec no UE support    Time  8    Period  Weeks    Status  New             Plan - 03/15/17 1650    Clinical Impression Statement  Pt is a 83 year old male with L flank/low back/hip pain following laminectomy 02/08/17, that patient reports has slowly become more debilitating. Current activity limitations in bending, prolonged standing/walking, self-care ADLs, and squatting/kneeling. Impairments including painful trunk ROM (especially flexion), soft tissue tightness at bilat glute med/min/piriformis/gemelli and bilat hamstrings, glute weakness, low back/L flank pain with referral into L buttock region, core weakness, decreased balance, and decreased walking endurance. Participation restriction: completing household chores, traveling. Pt will benefit from skilled PT intervention to address the aforementioned impairments and activity limitations for best return to PLOF.    History and Personal Factors relevant to plan of care:  2 personal factors/comorbidities, 3 body systems/activity limitations/participation restrictions     Clinical Presentation  Evolving    Clinical Presentation due to:  objective tests and measures  Clinical Decision Making  Moderate    Rehab Potential  Good    Clinical Impairments Affecting Rehab Potential  (+) motivation level, social support (-) age, chronicity of pain, sedentary lifestyle, other comorbidities    PT Frequency  2x / week    PT Duration  8 weeks    PT Treatment/Interventions  Electrical Stimulation;Moist Heat;Traction;Cryotherapy;Ultrasound;Aquatic Therapy;Therapeutic activities;Patient/family education;Manual techniques;Dry needling;Therapeutic exercise;Balance training;Taping;Neuromuscular re-education;Functional mobility training;Gait training;Passive range of motion    PT Next Visit Plan  HEP  review; manual techniques for soft tissue; balance assessment    PT Home Exercise Plan  hamstring stretch, piriformis stretch, bridge exercise, clamshell exercise    Consulted and Agree with Plan of Care  Patient       Patient will benefit from skilled therapeutic intervention in order to improve the following deficits and impairments:  Abnormal gait, Decreased balance, Decreased endurance, Decreased mobility, Difficulty walking, Increased muscle spasms, Decreased range of motion, Impaired tone, Improper body mechanics, Obesity, Pain, Postural dysfunction, Impaired flexibility, Increased fascial restricitons, Decreased strength, Decreased activity tolerance  Visit Diagnosis: Muscle weakness (generalized)  Chronic bilateral low back pain without sciatica     Problem List Patient Active Problem List   Diagnosis Date Noted  . Hypertension 01/18/2017  . Hyperlipidemia 01/18/2017  . PAD (peripheral artery disease) ([place]) 01/18/2017  . Lumbosacral spondylosis with radiculopathy 02/09/2016  . Bulbous urethral stricture 01/08/2015  . Bladder calculus 01/08/2015   Shelton Silvas PT, DPT Shelton Silvas 03/15/2017, 5:04 PM  [facility] [address] [place], [address] Phone: [phone]   Fax:  [phone]  Name: Carlos Obrien MRN: [id number] Date of Birth: [birth year]

## 2017-03-22 ENCOUNTER — Ambulatory Visit: Payer: Medicare Other | Admitting: Physical Therapy

## 2017-03-24 ENCOUNTER — Encounter: Payer: Medicare Other | Admitting: Physical Therapy

## 2017-03-28 ENCOUNTER — Ambulatory Visit: Payer: Medicare Other | Admitting: Physical Therapy

## 2017-03-30 ENCOUNTER — Ambulatory Visit: Payer: Medicare Other | Admitting: Physical Therapy

## 2017-03-30 DIAGNOSIS — M6281 Muscle weakness (generalized): Secondary | ICD-10-CM

## 2017-03-31 NOTE — Therapy (Signed)
[place] [facility] [address], [place], [address] Phone: [phone]   Fax:  [phone]  Physical Therapy Treatment  Patient Details  Name: Carlos Obrien MRN: [id number] Date of Birth: 1934-05-01 Referring Provider: Denton Meek, MD   Encounter Date: 03/30/2017    Past Medical History:  Diagnosis Date  . Anemia    hx  . Bilateral lower extremity edema   . Bladder calculi   . BPH (benign prostatic hyperplasia)   . CKD (chronic kidney disease)   . Coronary artery disease    cardiologist-- dr Loney Laurence [facility] clinic)  lov note in care everywhere tab in epic  . DDD (degenerative disc disease), lumbar   . Diabetes mellitus without complication ([place])   . Diastolic CHF, chronic ([place])   . Dyspnea    exersion  . Dysrhythmia    atrial fib  . GERD (gastroesophageal reflux disease)   . History of adenomatous polyp of colon    2012  . History of Barrett's esophagus   . History of basal cell carcinoma excision    Sept 2016  --scalp  . History of colon cancer    1999  adenocarcinoma polyp  . History of hiatal hernia   . History of kidney stones   . History of kidney stones   . History of MI (myocardial infarction)    1996  . Hyperlipidemia   . Hypertension   . PAF (paroxysmal atrial fibrillation) ([place])    dx 2015  . Prostate cancer ([place])   . Urethral stricture   . Urinary incontinence     Past Surgical History:  Procedure Laterality Date  . BLEPHAROPLASTY Bilateral Fall 2015  . CORONARY ANGIOPLASTY  May 1996  dr fath [facility] date verified w/ medical records)   per cardiologist note in epic (dr Loney Laurence)  2 vessel CAD-- occluded RCA and 60% LAD  . CYSTOSCOPY WITH HOLMIUM LASER LITHOTRIPSY N/A 01/08/2015   Procedure: CYSTOSCOPY WITH REMOVAL OF BLADDER STONE;  Surgeon: Rana Snare, MD;  Location: [facility];  Service: Urology;  Laterality: N/A;  . CYSTOSCOPY WITH RETROGRADE PYELOGRAM,  URETEROSCOPY AND STENT PLACEMENT  Aug 2015  . CYSTOSCOPY WITH URETHRAL DILATATION N/A 01/08/2015   Procedure: CYSTOSCOPY WITH URETHRAL DILATATION;  Surgeon: Rana Snare, MD;  Location: [facility];  Service: Urology;  Laterality: N/A;  . LUMBAR LAMINECTOMY/DECOMPRESSION MICRODISCECTOMY N/A 02/09/2016   Procedure: Lumbar four  to Sacral one Laminectomy for decompression;  Surgeon: Kevan Ny Ditty, MD;  Location: [place];  Service: Neurosurgery;  Laterality: N/A;  L4 to S1 Laminectomy for decompression  . PARTIAL KNEE ARTHROPLASTY Right 11-08-2012  . TRANSTHORACIC ECHOCARDIOGRAM  10-22-2013   mild concentric LVH,  grade 1 diastolic dysfunction,  ef 50-55%,  mild MR and TR,  trivial PR    There were no vitals filed for this visit.  Subjective Assessment - 03/31/17 2002    Subjective  Patient came into PT today, but is still having diarrhea and does not think he should participate in PT today. Patient was very pale and PT advised that he is correct in his assumption to forgo PT and hydrate. Patient advised to see PCP if symptoms do not stop or worsen, as patient has reported diarrhea over the past week. Patient reports he is on the upswing, but if he regresses that he will see PCP. PT went over HEP with patient and encouraged him to continue HEP as able in lieu of therapy this  week, and next week (patient will be on vacation)    Pertinent History  Patient is a 83 year old male that had LBP for a 2 year time period, and had surgery 02/09/16:  L4 to S1 Laminectomy for decompression. Patient reports he had pain relief for some time, but has slowly increased over the past couple months. Patient reports he has some pins and needles sensation down the lateral aspect of both legs as well as pain in the low back.  Worst pain in the past week 2/10 best 0/10. Pain is exacerbated by bending over (to shave ptreports) and prolonged walking. Patient reports his leg tingling usually only happens at rest.     Limitations  Standing;Walking;House hold activities    How long can you sit comfortably?  unlimited    How long can you stand comfortably?  20 min    How long can you walk comfortably?  10 min    Patient Stated Goals  Decrease pain; be able to walk again    Pain Onset  More than a month ago                No data recorded                 PT Short Term Goals - 03/15/17 1659      PT SHORT TERM GOAL #1   Title  Pt will be independent with HEP in order to improve strength and balance in order to decrease fall risk and improve function at home and work.    Time  2    Period  Weeks    Status  New        PT Long Term Goals - 03/15/17 1700      PT LONG TERM GOAL #1   Title  Patient will increase FOTO score to 79 to demonstrate predicted increase in functional mobility to complete ADLs    Baseline  03/15/17 50     Time  8    Period  Weeks    Status  New      PT LONG TERM GOAL #2   Title  Pt will decrease mODI scoreby at least 13 points in order demonstrate clinically significant reduction in pain/disability    Baseline  03/15/17 32%    Time  8    Period  Weeks    Status  New      PT LONG TERM GOAL #3   Title  Pt will increase 6MWT by at least 8m (157ft) in order to demonstrate clinically significant improvement in cardiopulmonary endurance and community ambulation    Baseline  03/15/16 750ft with 3 seated rest breaks      PT LONG TERM GOAL #4   Title  Pt will decrease 5TSTS by at least 3 seconds in order to demonstrate clinically significant improvement in LE strength    Baseline  03/15/17 16sec no UE support    Time  8    Period  Weeks    Status  New              Patient will benefit from skilled therapeutic intervention in order to improve the following deficits and impairments:     Visit Diagnosis: Muscle weakness (generalized)     Problem List Patient Active Problem List   Diagnosis Date Noted  . Hypertension 01/18/2017  .  Hyperlipidemia 01/18/2017  . PAD (peripheral artery disease) ([place]) 01/18/2017  . Lumbosacral spondylosis with radiculopathy 02/09/2016  . Bulbous  urethral stricture 01/08/2015  . Bladder calculus 01/08/2015   Shelton Silvas PT, DPT Shelton Silvas 03/31/2017, 8:09 PM  [place] [facility] [address], [place], [address] Phone: [phone]   Fax:  [phone]  Name: Carlos Obrien MRN: [id number] Date of Birth: 1934/01/23

## 2017-04-04 ENCOUNTER — Ambulatory Visit: Payer: Medicare Other | Admitting: Physical Therapy

## 2017-04-08 ENCOUNTER — Ambulatory Visit: Payer: Medicare Other | Admitting: Physical Therapy

## 2017-04-11 ENCOUNTER — Ambulatory Visit: Payer: Medicare Other | Attending: Internal Medicine | Admitting: Physical Therapy

## 2017-04-11 DIAGNOSIS — M6281 Muscle weakness (generalized): Secondary | ICD-10-CM | POA: Insufficient documentation

## 2017-04-13 ENCOUNTER — Encounter: Payer: Self-pay | Admitting: Physical Therapy

## 2017-04-13 ENCOUNTER — Ambulatory Visit: Payer: Medicare Other | Admitting: Physical Therapy

## 2017-04-13 DIAGNOSIS — M6281 Muscle weakness (generalized): Secondary | ICD-10-CM

## 2017-04-13 NOTE — Therapy (Signed)
[place] [facility] [address], [place], [address] Phone: [phone]   Fax:  [phone]  Physical Therapy Treatment  Patient Details  Name: Carlos Obrien MRN: [id number] Date of Birth: 1934-01-15 Referring Provider: Denton Meek, MD   Encounter Date: 04/13/2017  PT End of Session - 04/13/17 1354    Visit Number  2    Number of Visits  17    Date for PT Re-Evaluation  05/10/17    Authorization - Visit Number  1    Authorization - Number of Visits  6    PT Start Time  0141    PT Stop Time  0220    PT Time Calculation (min)  39 min    Activity Tolerance  Patient tolerated treatment well;Patient limited by fatigue    Behavior During Therapy  [facility] for tasks assessed/performed       Past Medical History:  Diagnosis Date  . Anemia    hx  . Bilateral lower extremity edema   . Bladder calculi   . BPH (benign prostatic hyperplasia)   . CKD (chronic kidney disease)   . Coronary artery disease    cardiologist-- dr Loney Laurence [facility] clinic)  lov note in care everywhere tab in epic  . DDD (degenerative disc disease), lumbar   . Diabetes mellitus without complication ([place])   . Diastolic CHF, chronic ([place])   . Dyspnea    exersion  . Dysrhythmia    atrial fib  . GERD (gastroesophageal reflux disease)   . History of adenomatous polyp of colon    2012  . History of Barrett's esophagus   . History of basal cell carcinoma excision    Sept 2016  --scalp  . History of colon cancer    1999  adenocarcinoma polyp  . History of hiatal hernia   . History of kidney stones   . History of kidney stones   . History of MI (myocardial infarction)    1996  . Hyperlipidemia   . Hypertension   . PAF (paroxysmal atrial fibrillation) ([place])    dx 2015  . Prostate cancer ([place])   . Urethral stricture   . Urinary incontinence     Past Surgical History:  Procedure Laterality Date  . BLEPHAROPLASTY Bilateral Fall 2015  .  CORONARY ANGIOPLASTY  May 1996  dr fath [facility] date verified w/ medical records)   per cardiologist note in epic (dr Loney Laurence)  2 vessel CAD-- occluded RCA and 60% LAD  . CYSTOSCOPY WITH HOLMIUM LASER LITHOTRIPSY N/A 01/08/2015   Procedure: CYSTOSCOPY WITH REMOVAL OF BLADDER STONE;  Surgeon: Rana Snare, MD;  Location: [facility];  Service: Urology;  Laterality: N/A;  . CYSTOSCOPY WITH RETROGRADE PYELOGRAM, URETEROSCOPY AND STENT PLACEMENT  Aug 2015  . CYSTOSCOPY WITH URETHRAL DILATATION N/A 01/08/2015   Procedure: CYSTOSCOPY WITH URETHRAL DILATATION;  Surgeon: Rana Snare, MD;  Location: [facility];  Service: Urology;  Laterality: N/A;  . LUMBAR LAMINECTOMY/DECOMPRESSION MICRODISCECTOMY N/A 02/09/2016   Procedure: Lumbar four  to Sacral one Laminectomy for decompression;  Surgeon: Kevan Ny Ditty, MD;  Location: [place];  Service: Neurosurgery;  Laterality: N/A;  L4 to S1 Laminectomy for decompression  . PARTIAL KNEE ARTHROPLASTY Right 11-08-2012  . TRANSTHORACIC ECHOCARDIOGRAM  10-22-2013   mild concentric LVH,  grade 1 diastolic dysfunction,  ef 50-55%,  mild MR and TR,  trivial PR    There were no vitals filed for this visit.  Subjective  Assessment - 04/13/17 1343    Subjective  Patient reports he is feeling better today. Patient reports no pain since last visit, and that he just continues to feel weak and is unable to walk far. Patient reports he feels unsteady with standing quickly or making sudden moves. Patient reports no compliance with his home exercises .    Pertinent History  Patient is a 83 year old male that had LBP for a 2 year time period, and had surgery 02/09/16:  L4 to S1 Laminectomy for decompression. Patient reports he had pain relief for some time, but has slowly increased over the past couple months. Patient reports he has some pins and needles sensation down the lateral aspect of both legs as well as pain in the low back.  Worst pain in the past  week 2/10 best 0/10. Pain is exacerbated by bending over (to shave ptreports) and prolonged walking. Patient reports his leg tingling usually only happens at rest.    Limitations  Standing;Walking;House hold activities    How long can you sit comfortably?  unlimited    How long can you stand comfortably?  20 min    How long can you walk comfortably?  10 min    Patient Stated Goals  Decrease pain; be able to walk again    Pain Onset  More than a month ago         Ther-Ex - Nustep 4 mins during hx intake - Seated piriformis stretch 3x each  30sec holds (HEP review)  w/ cuing and demonstration needed initially  - Bridge exercise 3x 10 50% full ROM (HEP review) with cuing for glute activation prior to lift and eccentric lowering -Clamshell exercise 3x 10 each (HEP review) with cuing for pelvic alignment and to prevent pelvic rotation compensation -Seated hamstring stretch 3x each 30sec holds (HEP review) w/ cuing and demonstration needed initially  -STS from elevated mat table 3x 10 with cuing for full knee/hip ext -OMEGA leg press 55# 3x 10 w/ cuing for eccentric control                       PT Education - 04/13/17 1353    Education provided  Yes    Education Details  exercise form; HEP review and reinforcement    Person(s) Educated  Patient    Methods  Explanation;Demonstration;Tactile cues;Verbal cues;Handout    Comprehension  Verbal cues required;Tactile cues required;Returned demonstration;Verbalized understanding       PT Short Term Goals - 03/15/17 1659      PT SHORT TERM GOAL #1   Title  Pt will be independent with HEP in order to improve strength and balance in order to decrease fall risk and improve function at home and work.    Time  2    Period  Weeks    Status  New        PT Long Term Goals - 03/15/17 1700      PT LONG TERM GOAL #1   Title  Patient will increase FOTO score to 79 to demonstrate predicted increase in functional mobility to  complete ADLs    Baseline  03/15/17 50     Time  8    Period  Weeks    Status  New      PT LONG TERM GOAL #2   Title  Pt will decrease mODI scoreby at least 13 points in order demonstrate clinically significant reduction in pain/disability    Baseline  03/15/17 32%    Time  8    Period  Weeks    Status  New      PT LONG TERM GOAL #3   Title  Pt will increase 6MWT by at least 29m (175ft) in order to demonstrate clinically significant improvement in cardiopulmonary endurance and community ambulation    Baseline  03/15/16 737ft with 3 seated rest breaks      PT LONG TERM GOAL #4   Title  Pt will decrease 5TSTS by at least 3 seconds in order to demonstrate clinically significant improvement in LE strength    Baseline  03/15/17 16sec no UE support    Time  8    Period  Weeks    Status  New            Plan - 04/13/17 1403    Clinical Impression Statement  PT went over HEP with patient again today to ensure he was aware of proper form with exercises. Patient was able to demonstrate proper form with all exercises following PT ccuing, and HEP was reinforced to patient with current deficits explained and purpose of HEP to maintain strength gains explained; patient verbalized understanding. Patient is not very motivated to exercise, but is a good sport and is very agreeable with PT.     Clinical Impairments Affecting Rehab Potential  (+) motivation level, social support (-) age, chronicity of pain, sedentary lifestyle, other comorbidities    PT Frequency  2x / week    PT Duration  8 weeks    PT Treatment/Interventions  Electrical Stimulation;Moist Heat;Traction;Cryotherapy;Ultrasound;Aquatic Therapy;Therapeutic activities;Patient/family education;Manual techniques;Dry needling;Therapeutic exercise;Balance training;Taping;Neuromuscular re-education;Functional mobility training;Gait training;Passive range of motion    PT Next Visit Plan  abd foccused resistance training; balance assessment    PT  Home Exercise Plan  hamstring stretch, piriformis stretch, bridge exercise, clamshell exercise    Consulted and Agree with Plan of Care  Patient       Patient will benefit from skilled therapeutic intervention in order to improve the following deficits and impairments:  Abnormal gait, Decreased balance, Decreased endurance, Decreased mobility, Difficulty walking, Increased muscle spasms, Decreased range of motion, Impaired tone, Improper body mechanics, Obesity, Pain, Postural dysfunction, Impaired flexibility, Increased fascial restricitons, Decreased strength, Decreased activity tolerance  Visit Diagnosis: Muscle weakness (generalized)     Problem List Patient Active Problem List   Diagnosis Date Noted  . Hypertension 01/18/2017  . Hyperlipidemia 01/18/2017  . PAD (peripheral artery disease) ([place]) 01/18/2017  . Lumbosacral spondylosis with radiculopathy 02/09/2016  . Bulbous urethral stricture 01/08/2015  . Bladder calculus 01/08/2015   Shelton Silvas PT, DPT Shelton Silvas 04/13/2017, 2:19 PM  [place] [facility] [address] [place], [address] Phone: [phone]   Fax:  [phone]  Name: RETT STEHLIK MRN: [id number] Date of Birth: 1934-01-22

## 2017-04-18 ENCOUNTER — Encounter: Payer: Self-pay | Admitting: Physical Therapy

## 2017-04-18 ENCOUNTER — Ambulatory Visit: Payer: Medicare Other | Admitting: Physical Therapy

## 2017-04-18 DIAGNOSIS — M6281 Muscle weakness (generalized): Secondary | ICD-10-CM

## 2017-04-18 NOTE — Therapy (Signed)
[place] [facility] [address], [place], [address] Phone: [phone]   Fax:  [phone]  Physical Therapy Treatment  Patient Details  Name: Carlos Obrien MRN: [id number] Date of Birth: [birth year] Referring Provider: Denton Meek, MD   Encounter Date: 04/18/2017  PT End of Session - 04/18/17 1035    Visit Number  3    Number of Visits  17    Date for PT Re-Evaluation  05/10/17    Authorization - Visit Number  3    Authorization - Number of Visits  6    PT Start Time  2993    PT Stop Time  1115    PT Time Calculation (min)  45 min    Activity Tolerance  Patient tolerated treatment well;Patient limited by fatigue    Behavior During Therapy  [facility] for tasks assessed/performed       Past Medical History:  Diagnosis Date  . Anemia    hx  . Bilateral lower extremity edema   . Bladder calculi   . BPH (benign prostatic hyperplasia)   . CKD (chronic kidney disease)   . Coronary artery disease    cardiologist-- dr Loney Laurence [facility] clinic)  lov note in care everywhere tab in epic  . DDD (degenerative disc disease), lumbar   . Diabetes mellitus without complication ([place])   . Diastolic CHF, chronic ([place])   . Dyspnea    exersion  . Dysrhythmia    atrial fib  . GERD (gastroesophageal reflux disease)   . History of adenomatous polyp of colon    2012  . History of Barrett's esophagus   . History of basal cell carcinoma excision    Sept 2016  --scalp  . History of colon cancer    1999  adenocarcinoma polyp  . History of hiatal hernia   . History of kidney stones   . History of kidney stones   . History of MI (myocardial infarction)    1996  . Hyperlipidemia   . Hypertension   . PAF (paroxysmal atrial fibrillation) ([place])    dx 2015  . Prostate cancer ([place])   . Urethral stricture   . Urinary incontinence     Past Surgical History:  Procedure Laterality Date  . BLEPHAROPLASTY Bilateral Fall 2015  .  CORONARY ANGIOPLASTY  May 1996  dr fath [facility] date verified w/ medical records)   per cardiologist note in epic (dr Loney Laurence)  2 vessel CAD-- occluded RCA and 60% LAD  . CYSTOSCOPY WITH HOLMIUM LASER LITHOTRIPSY N/A 01/08/2015   Procedure: CYSTOSCOPY WITH REMOVAL OF BLADDER STONE;  Surgeon: Rana Snare, MD;  [facility];  Service: Urology;  Laterality: N/A;  . CYSTOSCOPY WITH RETROGRADE PYELOGRAM, URETEROSCOPY AND STENT PLACEMENT  Aug 2015  . CYSTOSCOPY WITH URETHRAL DILATATION N/A 01/08/2015   Procedure: CYSTOSCOPY WITH URETHRAL DILATATION;  Surgeon: Rana Snare, MD;  Location: [facility];  Service: Urology;  Laterality: N/A;  . LUMBAR LAMINECTOMY/DECOMPRESSION MICRODISCECTOMY N/A 02/09/2016   Procedure: Lumbar four  to Sacral one Laminectomy for decompression;  Surgeon: Kevan Ny Ditty, MD;  Location: [place];  Service: Neurosurgery;  Laterality: N/A;  L4 to S1 Laminectomy for decompression  . PARTIAL KNEE ARTHROPLASTY Right 11-08-2012  . TRANSTHORACIC ECHOCARDIOGRAM  10-22-2013   mild concentric LVH,  grade 1 diastolic dysfunction,  ef 50-55%,  mild MR and TR,  trivial PR    There were no vitals filed for this visit.  Subjective  Assessment - 04/18/17 1033    Subjective  Patient reports he has had no pain since last visit. Patient reports he is still feeling intermittent unsteadiness. Patient has not been deligent with his HEP and does not offer an explanation.     Pertinent History  Patient is a 83 year old male that had LBP for a 2 year time period, and had surgery 02/09/16:  L4 to S1 Laminectomy for decompression. Patient reports he had pain relief for some time, but has slowly increased over the past couple months. Patient reports he has some pins and needles sensation down the lateral aspect of both legs as well as pain in the low back.  Worst pain in the past week 2/10 best 0/10. Pain is exacerbated by bending over (to shave ptreports) and prolonged  walking. Patient reports his leg tingling usually only happens at rest.    Limitations  Standing;Walking;House hold activities    How long can you sit comfortably?  unlimited    How long can you stand comfortably?  20 min    How long can you walk comfortably?  10 min       Ther-Ex -Nustep 4 min during hx intake -Stair extension/hamstring stretch and flexion/quad stretch 3x 30sec holds each leg with cuing and demonstration needed for proper form w/ quad stretch - Mini squat 3x 12 from elevated mat table with cuing for glute contraction and full hip ext at standing phase -Matrix hip abd 3x 10 25# with cuing for eccentric control and for proper posture preventing hip flexion compensation -OMEGA leg press 55# 3x 12 with min cuing for eccentric control -Monster walks 39ft x 2 w/ red tband at lower legs with cuing for large steps and severe fatigue noted from patient at hip abd - Multiple trials of SLS decreasing UE support as trials progressed where patient was able to stand on R and L LE for 30sec with bilat 2 finger and 1 finger support; and unable to hold SLS position for more than 2-3 sec without any UE support                      PT Education - 04/18/17 1035    Education provided  Yes    Education Details  HEP; reinforcement of importance of HEP    Person(s) Educated  Patient    Methods  Explanation;Demonstration;Verbal cues    Comprehension  Verbal cues required;Returned demonstration;Verbalized understanding       PT Short Term Goals - 03/15/17 1659      PT SHORT TERM GOAL #1   Title  Pt will be independent with HEP in order to improve strength and balance in order to decrease fall risk and improve function at home and work.    Time  2    Period  Weeks    Status  New        PT Long Term Goals - 03/15/17 1700      PT LONG TERM GOAL #1   Title  Patient will increase FOTO score to 79 to demonstrate predicted increase in functional mobility to complete ADLs     Baseline  03/15/17 50     Time  8    Period  Weeks    Status  New      PT LONG TERM GOAL #2   Title  Pt will decrease mODI scoreby at least 13 points in order demonstrate clinically significant reduction in pain/disability    Baseline  03/15/17 32%    Time  8    Period  Weeks    Status  New      PT LONG TERM GOAL #3   Title  Pt will increase 6MWT by at least 24m (173ft) in order to demonstrate clinically significant improvement in cardiopulmonary endurance and community ambulation    Baseline  03/15/16 728ft with 3 seated rest breaks      PT LONG TERM GOAL #4   Title  Pt will decrease 5TSTS by at least 3 seconds in order to demonstrate clinically significant improvement in LE strength    Baseline  03/15/17 16sec no UE support    Time  8    Period  Weeks    Status  New            Plan - 04/18/17 1045    Clinical Impression Statement  Patient tolerated therex progression well with no pain, only noted fatigue, with patient requiring prolonged rest breaks between sets. Patient was again educated on importance of HEP for maintenance of PT strength gains, and patient reported he would try to be more deligent with his HEP.     Rehab Potential  Good    Clinical Impairments Affecting Rehab Potential  (+) motivation level, social support (-) age, chronicity of pain, sedentary lifestyle, other comorbidities    PT Frequency  2x / week    PT Duration  8 weeks    PT Treatment/Interventions  Electrical Stimulation;Moist Heat;Traction;Cryotherapy;Ultrasound;Aquatic Therapy;Therapeutic activities;Patient/family education;Manual techniques;Dry needling;Therapeutic exercise;Balance training;Taping;Neuromuscular re-education;Functional mobility training;Gait training;Passive range of motion    PT Next Visit Plan  abd foccused resistance training; balance assessment    PT Home Exercise Plan  hamstring stretch, piriformis stretch, bridge exercise, clamshell exercise    Consulted and Agree with Plan  of Care  Patient       Patient will benefit from skilled therapeutic intervention in order to improve the following deficits and impairments:  Abnormal gait, Decreased balance, Decreased endurance, Decreased mobility, Difficulty walking, Increased muscle spasms, Decreased range of motion, Impaired tone, Improper body mechanics, Obesity, Pain, Postural dysfunction, Impaired flexibility, Increased fascial restricitons, Decreased strength, Decreased activity tolerance  Visit Diagnosis: Muscle weakness (generalized)     Problem List Patient Active Problem List   Diagnosis Date Noted  . Hypertension 01/18/2017  . Hyperlipidemia 01/18/2017  . PAD (peripheral artery disease) ([place]) 01/18/2017  . Lumbosacral spondylosis with radiculopathy 02/09/2016  . Bulbous urethral stricture 01/08/2015  . Bladder calculus 01/08/2015   Shelton Silvas PT, DPT Shelton Silvas 04/18/2017, 11:21 AM  [place] [facility] [address] [place], [address] Phone: [phone]   Fax:  [phone]  Name: Carlos Obrien MRN: [id number] Date of Birth: Jun 13, 1934

## 2017-04-21 ENCOUNTER — Ambulatory Visit: Payer: Medicare Other | Admitting: Physical Therapy

## 2017-04-25 ENCOUNTER — Ambulatory Visit: Payer: Medicare Other | Admitting: Physical Therapy

## 2017-04-28 ENCOUNTER — Encounter: Payer: Medicare Other | Admitting: Physical Therapy

## 2017-05-02 ENCOUNTER — Encounter: Payer: Medicare Other | Admitting: Physical Therapy

## 2017-05-05 ENCOUNTER — Encounter: Payer: Medicare Other | Admitting: Physical Therapy

## 2017-05-09 ENCOUNTER — Encounter: Payer: Medicare Other | Admitting: Physical Therapy

## 2017-05-12 ENCOUNTER — Ambulatory Visit: Payer: Medicare Other | Admitting: Physical Therapy

## 2017-06-08 ENCOUNTER — Other Ambulatory Visit: Payer: Self-pay | Admitting: Internal Medicine

## 2017-06-08 DIAGNOSIS — R911 Solitary pulmonary nodule: Secondary | ICD-10-CM

## 2017-06-20 ENCOUNTER — Ambulatory Visit
Admission: RE | Admit: 2017-06-20 | Discharge: 2017-06-20 | Disposition: A | Discharge status: Home / Self Care | Payer: Medicare Other | Source: Ambulatory Visit | Attending: Internal Medicine | Admitting: Internal Medicine

## 2017-06-20 DIAGNOSIS — I700 Atherosclerosis of aorta: Secondary | ICD-10-CM | POA: Diagnosis not present

## 2017-06-20 DIAGNOSIS — K7460 Unspecified cirrhosis of liver: Secondary | ICD-10-CM | POA: Insufficient documentation

## 2017-06-20 DIAGNOSIS — R911 Solitary pulmonary nodule: Secondary | ICD-10-CM

## 2017-06-20 DIAGNOSIS — I517 Cardiomegaly: Secondary | ICD-10-CM | POA: Diagnosis not present

## 2017-06-29 ENCOUNTER — Other Ambulatory Visit: Payer: Self-pay | Admitting: Neurology

## 2017-06-29 DIAGNOSIS — G3184 Mild cognitive impairment, so stated: Secondary | ICD-10-CM

## 2017-07-04 ENCOUNTER — Other Ambulatory Visit: Payer: Self-pay | Admitting: Neurology

## 2017-07-04 ENCOUNTER — Ambulatory Visit
Admission: RE | Admit: 2017-07-04 | Discharge: 2017-07-04 | Disposition: A | Discharge status: Home / Self Care | Payer: Medicare Other | Source: Ambulatory Visit | Attending: Neurology | Admitting: Neurology

## 2017-07-04 DIAGNOSIS — G3184 Mild cognitive impairment, so stated: Secondary | ICD-10-CM

## 2017-07-13 ENCOUNTER — Ambulatory Visit: Payer: Medicare Other | Attending: Neurology

## 2017-07-13 DIAGNOSIS — G4761 Periodic limb movement disorder: Secondary | ICD-10-CM | POA: Diagnosis not present

## 2017-07-13 DIAGNOSIS — R0683 Snoring: Secondary | ICD-10-CM | POA: Insufficient documentation

## 2017-08-16 ENCOUNTER — Other Ambulatory Visit: Payer: Self-pay | Admitting: Internal Medicine

## 2017-08-16 DIAGNOSIS — K7460 Unspecified cirrhosis of liver: Secondary | ICD-10-CM

## 2017-08-16 DIAGNOSIS — N183 Chronic kidney disease, stage 3 unspecified: Secondary | ICD-10-CM

## 2017-08-16 DIAGNOSIS — R188 Other ascites: Secondary | ICD-10-CM

## 2017-08-22 ENCOUNTER — Ambulatory Visit
Admission: RE | Admit: 2017-08-22 | Discharge: 2017-08-22 | Disposition: A | Discharge status: Home / Self Care | Payer: Medicare Other | Source: Ambulatory Visit | Attending: Internal Medicine | Admitting: Internal Medicine

## 2017-08-22 DIAGNOSIS — N183 Chronic kidney disease, stage 3 unspecified: Secondary | ICD-10-CM

## 2017-08-22 DIAGNOSIS — R188 Other ascites: Secondary | ICD-10-CM | POA: Diagnosis not present

## 2017-08-22 DIAGNOSIS — N261 Atrophy of kidney (terminal): Secondary | ICD-10-CM | POA: Diagnosis not present

## 2017-08-22 DIAGNOSIS — K7460 Unspecified cirrhosis of liver: Secondary | ICD-10-CM | POA: Insufficient documentation

## 2017-10-03 ENCOUNTER — Ambulatory Visit: Admit: 2017-10-03 | Payer: Medicare Other | Admitting: Unknown Physician Specialty

## 2017-10-03 SURGERY — COLONOSCOPY WITH PROPOFOL
Anesthesia: General

## 2017-10-10 ENCOUNTER — Other Ambulatory Visit: Payer: Self-pay | Admitting: Neurosurgery

## 2017-10-10 DIAGNOSIS — M48062 Spinal stenosis, lumbar region with neurogenic claudication: Secondary | ICD-10-CM

## 2017-10-20 ENCOUNTER — Ambulatory Visit
Admission: RE | Admit: 2017-10-20 | Discharge: 2017-10-20 | Disposition: A | Discharge status: Home / Self Care | Payer: Medicare Other | Source: Ambulatory Visit | Attending: Neurosurgery | Admitting: Neurosurgery

## 2017-10-20 DIAGNOSIS — M48062 Spinal stenosis, lumbar region with neurogenic claudication: Secondary | ICD-10-CM

## 2017-10-20 MED ORDER — GADOBENATE DIMEGLUMINE 529 MG/ML IV SOLN
10.0000 mL | Freq: Once | INTRAVENOUS | Status: AC | PRN
  Administered 2017-10-20: 10 mL via INTRAVENOUS

## 2018-12-17 ENCOUNTER — Emergency Department: Payer: Medicare Other

## 2018-12-17 ENCOUNTER — Other Ambulatory Visit: Payer: Self-pay

## 2018-12-17 ENCOUNTER — Encounter: Payer: Self-pay | Admitting: *Deleted

## 2018-12-17 ENCOUNTER — Inpatient Hospital Stay
Admission: EM | Admit: 2018-12-17 | Discharge: 2019-01-05 | DRG: 870 | Disposition: E | Payer: Medicare Other | Attending: Internal Medicine | Admitting: Internal Medicine

## 2018-12-17 DIAGNOSIS — E1169 Type 2 diabetes mellitus with other specified complication: Secondary | ICD-10-CM | POA: Diagnosis present

## 2018-12-17 DIAGNOSIS — A419 Sepsis, unspecified organism: Principal | ICD-10-CM | POA: Diagnosis present

## 2018-12-17 DIAGNOSIS — G92 Toxic encephalopathy: Secondary | ICD-10-CM | POA: Diagnosis not present

## 2018-12-17 DIAGNOSIS — E785 Hyperlipidemia, unspecified: Secondary | ICD-10-CM | POA: Diagnosis present

## 2018-12-17 DIAGNOSIS — W19XXXA Unspecified fall, initial encounter: Secondary | ICD-10-CM

## 2018-12-17 DIAGNOSIS — I2510 Atherosclerotic heart disease of native coronary artery without angina pectoris: Secondary | ICD-10-CM | POA: Diagnosis present

## 2018-12-17 DIAGNOSIS — J9690 Respiratory failure, unspecified, unspecified whether with hypoxia or hypercapnia: Secondary | ICD-10-CM

## 2018-12-17 DIAGNOSIS — R6521 Severe sepsis with septic shock: Secondary | ICD-10-CM | POA: Diagnosis present

## 2018-12-17 DIAGNOSIS — R0603 Acute respiratory distress: Secondary | ICD-10-CM

## 2018-12-17 DIAGNOSIS — R0602 Shortness of breath: Secondary | ICD-10-CM

## 2018-12-17 DIAGNOSIS — N320 Bladder-neck obstruction: Secondary | ICD-10-CM

## 2018-12-17 DIAGNOSIS — E1122 Type 2 diabetes mellitus with diabetic chronic kidney disease: Secondary | ICD-10-CM | POA: Diagnosis present

## 2018-12-17 DIAGNOSIS — R570 Cardiogenic shock: Secondary | ICD-10-CM | POA: Diagnosis present

## 2018-12-17 DIAGNOSIS — Z794 Long term (current) use of insulin: Secondary | ICD-10-CM

## 2018-12-17 DIAGNOSIS — M5136 Other intervertebral disc degeneration, lumbar region: Secondary | ICD-10-CM | POA: Diagnosis present

## 2018-12-17 DIAGNOSIS — Z85038 Personal history of other malignant neoplasm of large intestine: Secondary | ICD-10-CM

## 2018-12-17 DIAGNOSIS — E875 Hyperkalemia: Secondary | ICD-10-CM | POA: Diagnosis present

## 2018-12-17 DIAGNOSIS — Z7901 Long term (current) use of anticoagulants: Secondary | ICD-10-CM

## 2018-12-17 DIAGNOSIS — Z515 Encounter for palliative care: Secondary | ICD-10-CM | POA: Diagnosis not present

## 2018-12-17 DIAGNOSIS — Z20828 Contact with and (suspected) exposure to other viral communicable diseases: Secondary | ICD-10-CM | POA: Diagnosis present

## 2018-12-17 DIAGNOSIS — F05 Delirium due to known physiological condition: Secondary | ICD-10-CM | POA: Diagnosis not present

## 2018-12-17 DIAGNOSIS — Z452 Encounter for adjustment and management of vascular access device: Secondary | ICD-10-CM

## 2018-12-17 DIAGNOSIS — N1832 Chronic kidney disease, stage 3b: Secondary | ICD-10-CM | POA: Diagnosis present

## 2018-12-17 DIAGNOSIS — K567 Ileus, unspecified: Secondary | ICD-10-CM

## 2018-12-17 DIAGNOSIS — I130 Hypertensive heart and chronic kidney disease with heart failure and stage 1 through stage 4 chronic kidney disease, or unspecified chronic kidney disease: Secondary | ICD-10-CM | POA: Diagnosis present

## 2018-12-17 DIAGNOSIS — N400 Enlarged prostate without lower urinary tract symptoms: Secondary | ICD-10-CM | POA: Diagnosis present

## 2018-12-17 DIAGNOSIS — N138 Other obstructive and reflux uropathy: Secondary | ICD-10-CM | POA: Diagnosis present

## 2018-12-17 DIAGNOSIS — R531 Weakness: Secondary | ICD-10-CM

## 2018-12-17 DIAGNOSIS — Z79899 Other long term (current) drug therapy: Secondary | ICD-10-CM

## 2018-12-17 DIAGNOSIS — Z4659 Encounter for fitting and adjustment of other gastrointestinal appliance and device: Secondary | ICD-10-CM

## 2018-12-17 DIAGNOSIS — I480 Paroxysmal atrial fibrillation: Secondary | ICD-10-CM | POA: Diagnosis present

## 2018-12-17 DIAGNOSIS — R509 Fever, unspecified: Secondary | ICD-10-CM | POA: Diagnosis not present

## 2018-12-17 DIAGNOSIS — J189 Pneumonia, unspecified organism: Secondary | ICD-10-CM | POA: Diagnosis present

## 2018-12-17 DIAGNOSIS — Z66 Do not resuscitate: Secondary | ICD-10-CM | POA: Diagnosis not present

## 2018-12-17 DIAGNOSIS — K219 Gastro-esophageal reflux disease without esophagitis: Secondary | ICD-10-CM | POA: Diagnosis present

## 2018-12-17 DIAGNOSIS — Z789 Other specified health status: Secondary | ICD-10-CM

## 2018-12-17 DIAGNOSIS — E872 Acidosis: Secondary | ICD-10-CM | POA: Diagnosis present

## 2018-12-17 DIAGNOSIS — N179 Acute kidney failure, unspecified: Secondary | ICD-10-CM | POA: Diagnosis present

## 2018-12-17 DIAGNOSIS — J440 Chronic obstructive pulmonary disease with acute lower respiratory infection: Secondary | ICD-10-CM | POA: Diagnosis present

## 2018-12-17 DIAGNOSIS — E1165 Type 2 diabetes mellitus with hyperglycemia: Secondary | ICD-10-CM | POA: Diagnosis not present

## 2018-12-17 DIAGNOSIS — Z8546 Personal history of malignant neoplasm of prostate: Secondary | ICD-10-CM

## 2018-12-17 DIAGNOSIS — Z7189 Other specified counseling: Secondary | ICD-10-CM

## 2018-12-17 DIAGNOSIS — K2270 Barrett's esophagus without dysplasia: Secondary | ICD-10-CM | POA: Diagnosis present

## 2018-12-17 DIAGNOSIS — Z87442 Personal history of urinary calculi: Secondary | ICD-10-CM

## 2018-12-17 DIAGNOSIS — N39498 Other specified urinary incontinence: Secondary | ICD-10-CM

## 2018-12-17 DIAGNOSIS — N35912 Unspecified bulbous urethral stricture, male: Secondary | ICD-10-CM | POA: Diagnosis present

## 2018-12-17 DIAGNOSIS — N186 End stage renal disease: Secondary | ICD-10-CM | POA: Diagnosis present

## 2018-12-17 DIAGNOSIS — J9602 Acute respiratory failure with hypercapnia: Secondary | ICD-10-CM | POA: Diagnosis present

## 2018-12-17 DIAGNOSIS — D631 Anemia in chronic kidney disease: Secondary | ICD-10-CM | POA: Diagnosis present

## 2018-12-17 DIAGNOSIS — I5033 Acute on chronic diastolic (congestive) heart failure: Secondary | ICD-10-CM | POA: Diagnosis present

## 2018-12-17 DIAGNOSIS — J9601 Acute respiratory failure with hypoxia: Secondary | ICD-10-CM | POA: Diagnosis present

## 2018-12-17 DIAGNOSIS — I4819 Other persistent atrial fibrillation: Secondary | ICD-10-CM | POA: Diagnosis present

## 2018-12-17 LAB — CBC WITH DIFFERENTIAL/PLATELET
Abs Immature Granulocytes: 0.3 10*3/uL — ABNORMAL HIGH (ref 0.00–0.07)
Basophils Absolute: 0 10*3/uL (ref 0.0–0.1)
Basophils Relative: 0 %
Eosinophils Absolute: 0.2 10*3/uL (ref 0.0–0.5)
Eosinophils Relative: 4 %
HCT: 31.8 % — ABNORMAL LOW (ref 39.0–52.0)
Hemoglobin: 10.3 g/dL — ABNORMAL LOW (ref 13.0–17.0)
Immature Granulocytes: 7 %
Lymphocytes Relative: 7 %
Lymphs Abs: 0.3 10*3/uL — ABNORMAL LOW (ref 0.7–4.0)
MCH: 29.8 pg (ref 26.0–34.0)
MCHC: 32.4 g/dL (ref 30.0–36.0)
MCV: 91.9 fL (ref 80.0–100.0)
Monocytes Absolute: 0.4 10*3/uL (ref 0.1–1.0)
Monocytes Relative: 8 %
Neutro Abs: 3.3 10*3/uL (ref 1.7–7.7)
Neutrophils Relative %: 74 %
Platelets: 120 10*3/uL — ABNORMAL LOW (ref 150–400)
RBC: 3.46 MIL/uL — ABNORMAL LOW (ref 4.22–5.81)
RDW: 21.1 % — ABNORMAL HIGH (ref 11.5–15.5)
WBC Morphology: INCREASED
WBC: 4.5 10*3/uL (ref 4.0–10.5)
nRBC: 0 % (ref 0.0–0.2)

## 2018-12-17 LAB — LACTIC ACID, PLASMA: Lactic Acid, Venous: 4.6 mmol/L (ref 0.5–1.9)

## 2018-12-17 LAB — PROCALCITONIN: Procalcitonin: 0.11 ng/mL

## 2018-12-17 LAB — POC SARS CORONAVIRUS 2 AG: SARS Coronavirus 2 Ag: NEGATIVE

## 2018-12-17 LAB — COMPREHENSIVE METABOLIC PANEL
ALT: 25 U/L (ref 0–44)
AST: 47 U/L — ABNORMAL HIGH (ref 15–41)
Albumin: 3.5 g/dL (ref 3.5–5.0)
Alkaline Phosphatase: 76 U/L (ref 38–126)
Anion gap: 13 (ref 5–15)
BUN: 46 mg/dL — ABNORMAL HIGH (ref 8–23)
CO2: 21 mmol/L — ABNORMAL LOW (ref 22–32)
Calcium: 8.9 mg/dL (ref 8.9–10.3)
Chloride: 103 mmol/L (ref 98–111)
Creatinine, Ser: 2.55 mg/dL — ABNORMAL HIGH (ref 0.61–1.24)
GFR calc Af Amer: 26 mL/min — ABNORMAL LOW (ref >60)
GFR calc non Af Amer: 23 mL/min — ABNORMAL LOW (ref >60)
Glucose, Bld: 103 mg/dL — ABNORMAL HIGH (ref 70–99)
Potassium: 4.9 mmol/L (ref 3.5–5.1)
Sodium: 137 mmol/L (ref 135–145)
Total Bilirubin: 0.9 mg/dL (ref 0.3–1.2)
Total Protein: 6.6 g/dL (ref 6.5–8.1)

## 2018-12-17 LAB — TROPONIN I (HIGH SENSITIVITY): Troponin I (High Sensitivity): 6 ng/L (ref <18)

## 2018-12-17 LAB — PROTIME-INR
INR: 1.7 — ABNORMAL HIGH (ref 0.8–1.2)
Prothrombin Time: 19.5 seconds — ABNORMAL HIGH (ref 11.4–15.2)

## 2018-12-17 LAB — APTT: aPTT: 35 seconds (ref 24–36)

## 2018-12-17 MED ORDER — METRONIDAZOLE IN NACL 5-0.79 MG/ML-% IV SOLN
500.0000 mg | Freq: Once | INTRAVENOUS | Status: AC
  Administered 2018-12-17: 500 mg via INTRAVENOUS
  Filled 2018-12-17: qty 100

## 2018-12-17 MED ORDER — LACTATED RINGERS IV BOLUS
1000.0000 mL | Freq: Once | INTRAVENOUS | Status: AC
  Administered 2018-12-17: 1000 mL via INTRAVENOUS

## 2018-12-17 MED ORDER — SODIUM CHLORIDE 0.9 % IV SOLN
2.0000 g | Freq: Once | INTRAVENOUS | Status: DC
  Filled 2018-12-17: qty 2

## 2018-12-17 MED ORDER — SODIUM CHLORIDE 0.9 % IV SOLN
2.0000 g | INTRAVENOUS | Status: AC
  Administered 2018-12-17: 2 g via INTRAVENOUS

## 2018-12-17 MED ORDER — ACETAMINOPHEN 500 MG PO TABS
1000.0000 mg | ORAL_TABLET | Freq: Once | ORAL | Status: AC
  Administered 2018-12-17: 1000 mg via ORAL
  Filled 2018-12-17: qty 2

## 2018-12-17 MED ORDER — VANCOMYCIN HCL IN DEXTROSE 1-5 GM/200ML-% IV SOLN
1000.0000 mg | Freq: Once | INTRAVENOUS | Status: AC
  Administered 2018-12-17: 1000 mg via INTRAVENOUS
  Filled 2018-12-17: qty 200

## 2018-12-17 NOTE — Progress Notes (Signed)
PHARMACY -  BRIEF ANTIBIOTIC NOTE   Pharmacy has received consult(s) for Vancomycin and Cefepime from an ED provider.  The patient's profile has been reviewed for ht/wt/allergies/indication/available labs.    One time order(s) placed for Cefepime 2gm and Vancomycin 2500mg  total (1gm bag from floorstock + 1.5gm bag sent from pharmacy for 2.5gm total)  Further antibiotics/pharmacy consults should be ordered by admitting physician if indicated.                       Thank you, Ena Dawley 12/30/2018  9:37 PM

## 2018-12-17 NOTE — ED Notes (Signed)
Wife called in, update given

## 2018-12-17 NOTE — ED Triage Notes (Addendum)
EMS originally called to pts home to assist pt after falling off the toilet. PT reports he was not on the floor long before EMS arrived and he remembers the fall. Pt denies syncope.  When EMS arrived pt was unable to bear weight without leaning to the left. Pt is able to [place] cane at baseline. PT grimacing when moved but denies specific pain at this time. Per wife, pt has had cough, congestion and fever of 101 x 2-3 day without a COVID test having been performed yet. Pts temp is 101.9 upon arrival.   EMS vitals:  98.4 F CBG 84 Afib hx of same' 95% RA.

## 2018-12-17 NOTE — ED Provider Notes (Signed)
[facility] Emergency Department Provider Note   ____________________________________________   First MD Initiated Contact with Patient 12/08/2018 2117     (approximate)  I have reviewed the triage vital signs and the nursing notes.   HISTORY  Chief Complaint Weakness and Fall    HPI Carlos Obrien is a 84 y.o. male with past medical history of atrial fibrillation, CKD, CHF, diabetes, hypertension, hyperlipidemia who presents to the ED for weakness and fall.  EMS reports that patient went to sit down on the toilet just prior to arrival but missed and slid to the ground.  He denies hitting his head or losing consciousness, but has been feeling very weak and was unable to get himself up off the ground.  EMS noted him to be tachycardic with a stable blood pressure.  Pressure reports that he has been feeling weak for the past couple of days and wife had told EMS that he had had a fever with cough and congestion for the past 2 to 3 days.  He denies feeling weak in any specific location and denies any chest pain, shortness of breath, abdominal pain, vomiting, dysuria, or hematuria.  He is not aware of any sick contacts.        Past Medical History:  Diagnosis Date  . Anemia    hx  . Bilateral lower extremity edema   . Bladder calculi   . BPH (benign prostatic hyperplasia)   . CKD (chronic kidney disease)   . Coronary artery disease    cardiologist-- dr Loney Laurence [facility])  lov note in care everywhere tab in epic  . DDD (degenerative disc disease), lumbar   . Diabetes mellitus without complication ([place])   . Diastolic CHF, chronic ([place])   . Dyspnea    exersion  . Dysrhythmia    atrial fib  . GERD (gastroesophageal reflux disease)   . History of adenomatous polyp of colon    2012  . History of Barrett's esophagus   . History of basal cell carcinoma excision    Sept 2016  --scalp  . History of colon cancer    1999  adenocarcinoma polyp  . History  of hiatal hernia   . History of kidney stones   . History of kidney stones   . History of MI (myocardial infarction)    1996  . Hyperlipidemia   . Hypertension   . PAF (paroxysmal atrial fibrillation) ([place])    dx 2015  . Prostate cancer ([place])   . Urethral stricture   . Urinary incontinence     Patient Active Problem List   Diagnosis Date Noted  . Hypertension 01/18/2017  . Hyperlipidemia 01/18/2017  . PAD (peripheral artery disease) ([place]) 01/18/2017  . Lumbosacral spondylosis with radiculopathy 02/09/2016  . Bulbous urethral stricture 01/08/2015  . Bladder calculus 01/08/2015    Past Surgical History:  Procedure Laterality Date  . BLEPHAROPLASTY Bilateral Fall 2015  . CORONARY ANGIOPLASTY  May 1996  dr fath [facility] date verified w/ medical records)   per cardiologist note in epic (dr Loney Laurence)  2 vessel CAD-- occluded RCA and 60% LAD  . CYSTOSCOPY WITH HOLMIUM LASER LITHOTRIPSY N/A 01/08/2015   Procedure: CYSTOSCOPY WITH REMOVAL OF BLADDER STONE;  Surgeon: Rana Snare, MD;  Location: [facility];  Service: Urology;  Laterality: N/A;  . CYSTOSCOPY WITH RETROGRADE PYELOGRAM, URETEROSCOPY AND STENT PLACEMENT  Aug 2015  . CYSTOSCOPY WITH URETHRAL DILATATION N/A 01/08/2015   Procedure: CYSTOSCOPY WITH URETHRAL DILATATION;  Surgeon:  Rana Snare, MD;  Location: [facility];  Service: Urology;  Laterality: N/A;  . LUMBAR LAMINECTOMY/DECOMPRESSION MICRODISCECTOMY N/A 02/09/2016   Procedure: Lumbar four  to Sacral one Laminectomy for decompression;  Surgeon: Kevan Ny Ditty, MD;  Location: [place];  Service: Neurosurgery;  Laterality: N/A;  L4 to S1 Laminectomy for decompression  . PARTIAL KNEE ARTHROPLASTY Right 11-08-2012  . TRANSTHORACIC ECHOCARDIOGRAM  10-22-2013   mild concentric LVH,  grade 1 diastolic dysfunction,  ef 50-55%,  mild MR and TR,  trivial PR    Prior to Admission medications   Medication Sig Start Date End Date Taking? Authorizing  Provider  apixaban (ELIQUIS) 5 MG TABS tablet Take 1 tablet (5 mg total) by mouth 2 (two) times daily. 02/13/16   Costella, Vista Mink, PA-C  aspirin 325 MG tablet Take 325 mg by mouth daily.    [provider]  atenolol (TENORMIN) 25 MG tablet Take 50 mg by mouth every morning.     [provider]  atorvastatin (LIPITOR) 20 MG tablet Take 20 mg by mouth every evening.     [provider]  celecoxib (CELEBREX) 200 MG capsule Take 200 mg by mouth 2 (two) times daily.    [provider]  Cholecalciferol (VITAMIN D) 2000 units tablet Take 2,000 Units by mouth daily.    [provider]  desvenlafaxine (PRISTIQ) 50 MG 24 hr tablet Take by mouth. 11/29/16 11/29/17  [provider]  furosemide (LASIX) 40 MG tablet Take 40 mg by mouth every morning.     [provider]  gabapentin (NEURONTIN) 100 MG capsule Take 100 mg by mouth at bedtime.    [provider]  glipiZIDE (GLUCOTROL) 10 MG tablet Take 20 mg by mouth daily before breakfast.    [provider]  Insulin Glargine (LANTUS SOLOSTAR) 100 UNIT/ML Solostar Pen Inject 20 Units into the skin at bedtime.  03/31/15   [provider]  isosorbide mononitrate (IMDUR) 30 MG 24 hr tablet Take 30 mg by mouth every morning.     [provider]  Liraglutide (VICTOZA) 18 MG/3ML SOPN Inject 1.8 mg into the skin every morning.     [provider]  magnesium oxide (MAG-OX) 400 MG tablet Take 400 mg by mouth every evening.     [provider]  metFORMIN (GLUCOPHAGE) 500 MG tablet Take 1,000 mg by mouth 2 (two) times daily with a meal.     [provider]  methocarbamol (ROBAXIN-750) 750 MG tablet Take 1 tablet (750 mg total) by mouth 3 (three) times daily as needed for muscle spasms. 02/10/16   Costella, Vista Mink, PA-C  nitroGLYCERIN (NITROSTAT) 0.4 MG SL tablet Place 0.4 mg under the tongue every 5 (five) minutes as needed for chest pain.     [provider]  omeprazole (PRILOSEC) 40 MG capsule Take 40 mg by mouth every morning.     [provider]  oxyCODONE-acetaminophen (PERCOCET) 7.5-325 MG tablet Take 1-2 tablets by mouth as needed for pain. 02/10/16   Costella, Vista Mink, PA-C  pioglitazone (ACTOS) 30 MG tablet Take 30 mg by mouth every morning.     [provider]  ramipril (ALTACE) 2.5 MG capsule Take 2.5 mg by mouth every morning.     [provider]  silodosin (RAPAFLO) 8 MG CAPS capsule Take 8 mg by mouth daily with breakfast.    [provider]  vitamin B-12 (CYANOCOBALAMIN) 1000 MCG tablet Take 1,000 mcg by mouth daily.  [provider]    Allergies No known allergies  History reviewed. No pertinent family history.  Social History Social History   Tobacco Use  . Smoking status: Never Smoker  . Smokeless tobacco: Never Used  Substance Use Topics  . Alcohol use: No    Alcohol/week: 0.0 standard drinks  . Drug use: No    Review of Systems  Constitutional: Positive for fever/chills.  Positive for generalized weakness. Eyes: No visual changes. ENT: No sore throat. Cardiovascular: Denies chest pain. Respiratory: Denies shortness of breath.  Positive for cough and congestion. Gastrointestinal: No abdominal pain.  No nausea, no vomiting.  No diarrhea.  No constipation. Genitourinary: Negative for dysuria. Musculoskeletal: Negative for back pain. Skin: Negative for rash. Neurological: Negative for headaches, focal weakness or numbness.  ____________________________________________   PHYSICAL EXAM:  VITAL SIGNS: ED Triage Vitals  Enc Vitals Group     BP 12/24/2018 2124 (!) 145/81     Pulse Rate 12/24/2018 2124 (!) 117     Resp 12/12/2018 2124 (!) 24     Temp 12/25/2018 2124 (!) 101.9 F (38.8 C)     Temp Source 12/13/2018 2124 Oral     SpO2 12/25/2018 2124 95 %     Weight 12/19/2018 2125 294 lb 15.6 oz (133.8 kg)     Height --      Head Circumference --       Peak Flow --      Pain Score 12/21/2018 2124 0     Pain Loc --      Pain Edu? --      Excl. in [place]? --     Constitutional: Alert and oriented. Eyes: Conjunctivae are normal. Head: Atraumatic. Nose: No congestion/rhinnorhea. Mouth/Throat: Mucous membranes are moist. Neck: Normal ROM Cardiovascular: Tachycardic, irregularly irregular rhythm. Grossly normal heart sounds. Respiratory: Normal respiratory effort.  No retractions. Lungs CTAB. Gastrointestinal: Soft and nontender. No distention. Genitourinary: deferred Musculoskeletal: No lower extremity tenderness nor edema. Neurologic:  Normal speech and language. No gross focal neurologic deficits are appreciated. Skin:  Skin is warm, dry and intact. No rash noted. Psychiatric: Mood and affect are normal. Speech and behavior are normal.  ____________________________________________   LABS (all labs ordered are listed, but only abnormal results are displayed)  Labs Reviewed  LACTIC ACID, PLASMA - Abnormal; Notable for the following components:      Result Value   Lactic Acid, Venous 4.6 (*)    All other components within normal limits  COMPREHENSIVE METABOLIC PANEL - Abnormal; Notable for the following components:   CO2 21 (*)    Glucose, Bld 103 (*)    BUN 46 (*)    Creatinine, Ser 2.55 (*)    AST 47 (*)    GFR calc non Af Amer 23 (*)    GFR calc Af Amer 26 (*)    All other components within normal limits  CBC WITH DIFFERENTIAL/PLATELET - Abnormal; Notable for the following components:   RBC 3.46 (*)    Hemoglobin 10.3 (*)    HCT 31.8 (*)    RDW 21.1 (*)    Platelets 120 (*)    Lymphs Abs 0.3 (*)    Abs Immature Granulocytes 0.30 (*)    All other components within normal limits  PROTIME-INR - Abnormal; Notable for the following components:   Prothrombin Time 19.5 (*)    INR 1.7 (*)    All other components within normal limits  CULTURE, BLOOD (ROUTINE X 2)  CULTURE, BLOOD (ROUTINE X 2)  URINE CULTURE  SARS  CORONAVIRUS 2 (TAT 6-24 HRS)  APTT  PROCALCITONIN  LACTIC ACID, PLASMA  URINALYSIS, ROUTINE W REFLEX MICROSCOPIC  POC SARS CORONAVIRUS 2 AG  POC SARS CORONAVIRUS 2 AG -  ED  TROPONIN I (HIGH SENSITIVITY)   ____________________________________________  EKG  ED ECG REPORT I, Blake Divine, the attending physician, personally viewed and interpreted this ECG.   Date: 12/20/2018  EKG Time: 21:22  Rate: 108  Rhythm: atrial fibrillation, rate 108  Axis: Normal  Intervals:none  ST&T Change: None   PROCEDURES  Procedure(s) performed (including Critical Care):  .Critical Care Performed by: Blake Divine, MD Authorized by: Blake Divine, MD   Critical care provider statement:    Critical care time (minutes):  45   Critical care time was exclusive of:  Separately billable procedures and treating other patients and teaching time   Critical care was necessary to treat or prevent imminent or life-threatening deterioration of the following conditions:  Sepsis   Critical care was time spent personally by me on the following activities:  Discussions with consultants, evaluation of patient's response to treatment, examination of patient, ordering and performing treatments and interventions, ordering and review of laboratory studies, ordering and review of radiographic studies, pulse oximetry, re-evaluation of patient's condition, obtaining history from patient or surrogate and review of old charts   I assumed direction of critical care for this patient from another provider in my specialty: no       ____________________________________________   INITIAL IMPRESSION / ASSESSMENT AND PLAN / ED COURSE       84 year old male with history of atrial fibrillation presents to the ED complaining of fever with cough and congestion over the past couple of days, felt extremely weak prior to arrival and had a low mechanism fall when attempting to sit on the toilet.  Patient denies hitting his  head or losing consciousness and there was no syncope associated with the fall.  He does not appear to have any traumatic injuries, denies any pain and has no tenderness on exam.  His initial vitals are concerning for sepsis with fever and tachycardia.  He is noted to be in atrial fibrillation with a rate hovering around 100-110.  We will hydrate with IV fluids and treat with broad-spectrum antibiotics given lack of obvious source, reevaluate need for rate control once he completes fluid bolus.  Chest x-ray is concerning for COVID-19 versus multifocal bacterial pneumonia.  COVID-19 antigen testing is negative, will order PCR.  Labs otherwise significant for elevated lactate, patient to receive 2 L IV fluid bolus, however given his history of heart failure and otherwise stable vital signs, do not feel additional fluids would be beneficial in this patient.  Case discussed with hospitalist, who accepts patient for admission.        ____________________________________________   FINAL CLINICAL IMPRESSION(S) / ED DIAGNOSES  Final diagnoses:  Sepsis without acute organ dysfunction, due to unspecified organism [facility])  Multifocal pneumonia  Generalized weakness  Fall, initial encounter     ED Discharge Orders    None       Note:  This document was prepared using Dragon voice recognition software and may include unintentional dictation errors.   Blake Divine, MD 12/18/18 0030

## 2018-12-17 NOTE — ED Notes (Signed)
MD at bedside. 

## 2018-12-17 NOTE — Progress Notes (Signed)
CODE SEPSIS - PHARMACY COMMUNICATION  **Broad Spectrum Antibiotics should be administered within 1 hour of Sepsis diagnosis**  Time Code Sepsis Called/Page Received: 2135  Antibiotics Ordered: Vancomycin and Cefepime  Time of 1st antibiotic administration: 2211  Additional action taken by pharmacy: n/a   If necessary, Name of Provider/Nurse Contacted: n/a   Ena Dawley ,PharmD Clinical Pharmacist  12/21/2018  9:36 PM

## 2018-12-18 DIAGNOSIS — R531 Weakness: Secondary | ICD-10-CM | POA: Diagnosis not present

## 2018-12-18 DIAGNOSIS — A419 Sepsis, unspecified organism: Principal | ICD-10-CM

## 2018-12-18 DIAGNOSIS — J189 Pneumonia, unspecified organism: Secondary | ICD-10-CM

## 2018-12-18 DIAGNOSIS — R509 Fever, unspecified: Secondary | ICD-10-CM

## 2018-12-18 DIAGNOSIS — J9602 Acute respiratory failure with hypercapnia: Secondary | ICD-10-CM | POA: Diagnosis present

## 2018-12-18 DIAGNOSIS — N1832 Chronic kidney disease, stage 3b: Secondary | ICD-10-CM | POA: Diagnosis present

## 2018-12-18 DIAGNOSIS — Z7189 Other specified counseling: Secondary | ICD-10-CM | POA: Diagnosis not present

## 2018-12-18 DIAGNOSIS — Z7901 Long term (current) use of anticoagulants: Secondary | ICD-10-CM | POA: Diagnosis not present

## 2018-12-18 DIAGNOSIS — I130 Hypertensive heart and chronic kidney disease with heart failure and stage 1 through stage 4 chronic kidney disease, or unspecified chronic kidney disease: Secondary | ICD-10-CM | POA: Diagnosis present

## 2018-12-18 DIAGNOSIS — I480 Paroxysmal atrial fibrillation: Secondary | ICD-10-CM | POA: Diagnosis present

## 2018-12-18 DIAGNOSIS — E1122 Type 2 diabetes mellitus with diabetic chronic kidney disease: Secondary | ICD-10-CM | POA: Diagnosis present

## 2018-12-18 DIAGNOSIS — F05 Delirium due to known physiological condition: Secondary | ICD-10-CM | POA: Diagnosis not present

## 2018-12-18 DIAGNOSIS — N179 Acute kidney failure, unspecified: Secondary | ICD-10-CM

## 2018-12-18 DIAGNOSIS — Z66 Do not resuscitate: Secondary | ICD-10-CM | POA: Diagnosis not present

## 2018-12-18 DIAGNOSIS — Z20828 Contact with and (suspected) exposure to other viral communicable diseases: Secondary | ICD-10-CM | POA: Diagnosis present

## 2018-12-18 DIAGNOSIS — Z515 Encounter for palliative care: Secondary | ICD-10-CM | POA: Diagnosis not present

## 2018-12-18 DIAGNOSIS — I4819 Other persistent atrial fibrillation: Secondary | ICD-10-CM | POA: Diagnosis present

## 2018-12-18 DIAGNOSIS — R6520 Severe sepsis without septic shock: Secondary | ICD-10-CM

## 2018-12-18 DIAGNOSIS — I361 Nonrheumatic tricuspid (valve) insufficiency: Secondary | ICD-10-CM | POA: Diagnosis not present

## 2018-12-18 DIAGNOSIS — E872 Acidosis: Secondary | ICD-10-CM | POA: Diagnosis present

## 2018-12-18 DIAGNOSIS — J9601 Acute respiratory failure with hypoxia: Secondary | ICD-10-CM | POA: Diagnosis present

## 2018-12-18 DIAGNOSIS — K567 Ileus, unspecified: Secondary | ICD-10-CM | POA: Diagnosis not present

## 2018-12-18 DIAGNOSIS — I5033 Acute on chronic diastolic (congestive) heart failure: Secondary | ICD-10-CM | POA: Diagnosis present

## 2018-12-18 DIAGNOSIS — J181 Lobar pneumonia, unspecified organism: Secondary | ICD-10-CM | POA: Diagnosis not present

## 2018-12-18 DIAGNOSIS — J440 Chronic obstructive pulmonary disease with acute lower respiratory infection: Secondary | ICD-10-CM | POA: Diagnosis present

## 2018-12-18 DIAGNOSIS — E785 Hyperlipidemia, unspecified: Secondary | ICD-10-CM | POA: Diagnosis present

## 2018-12-18 DIAGNOSIS — N138 Other obstructive and reflux uropathy: Secondary | ICD-10-CM | POA: Diagnosis present

## 2018-12-18 DIAGNOSIS — R6521 Severe sepsis with septic shock: Secondary | ICD-10-CM | POA: Diagnosis present

## 2018-12-18 DIAGNOSIS — G92 Toxic encephalopathy: Secondary | ICD-10-CM | POA: Diagnosis not present

## 2018-12-18 DIAGNOSIS — E1169 Type 2 diabetes mellitus with other specified complication: Secondary | ICD-10-CM | POA: Diagnosis not present

## 2018-12-18 DIAGNOSIS — N186 End stage renal disease: Secondary | ICD-10-CM | POA: Diagnosis present

## 2018-12-18 LAB — BASIC METABOLIC PANEL
Anion gap: 14 (ref 5–15)
Anion gap: 10 (ref 5–15)
Anion gap: 11 (ref 5–15)
BUN: 39 mg/dL — ABNORMAL HIGH (ref 8–23)
BUN: 44 mg/dL — ABNORMAL HIGH (ref 8–23)
BUN: 52 mg/dL — ABNORMAL HIGH (ref 8–23)
CO2: 18 mmol/L — ABNORMAL LOW (ref 22–32)
CO2: 19 mmol/L — ABNORMAL LOW (ref 22–32)
CO2: 18 mmol/L — ABNORMAL LOW (ref 22–32)
Calcium: 8.7 mg/dL — ABNORMAL LOW (ref 8.9–10.3)
Calcium: 8.5 mg/dL — ABNORMAL LOW (ref 8.9–10.3)
Calcium: 8.1 mg/dL — ABNORMAL LOW (ref 8.9–10.3)
Chloride: 103 mmol/L (ref 98–111)
Chloride: 105 mmol/L (ref 98–111)
Chloride: 104 mmol/L (ref 98–111)
Creatinine, Ser: 2.35 mg/dL — ABNORMAL HIGH (ref 0.61–1.24)
Creatinine, Ser: 2.64 mg/dL — ABNORMAL HIGH (ref 0.61–1.24)
Creatinine, Ser: 3.07 mg/dL — ABNORMAL HIGH (ref 0.61–1.24)
GFR calc Af Amer: 29 mL/min — ABNORMAL LOW (ref >60)
GFR calc Af Amer: 25 mL/min — ABNORMAL LOW (ref >60)
GFR calc Af Amer: 21 mL/min — ABNORMAL LOW (ref >60)
GFR calc non Af Amer: 25 mL/min — ABNORMAL LOW (ref >60)
GFR calc non Af Amer: 22 mL/min — ABNORMAL LOW (ref >60)
GFR calc non Af Amer: 18 mL/min — ABNORMAL LOW (ref >60)
Glucose, Bld: 169 mg/dL — ABNORMAL HIGH (ref 70–99)
Glucose, Bld: 209 mg/dL — ABNORMAL HIGH (ref 70–99)
Glucose, Bld: 218 mg/dL — ABNORMAL HIGH (ref 70–99)
Potassium: 5.3 mmol/L — ABNORMAL HIGH (ref 3.5–5.1)
Potassium: 5.2 mmol/L — ABNORMAL HIGH (ref 3.5–5.1)
Potassium: 4.8 mmol/L (ref 3.5–5.1)
Sodium: 135 mmol/L (ref 135–145)
Sodium: 134 mmol/L — ABNORMAL LOW (ref 135–145)
Sodium: 133 mmol/L — ABNORMAL LOW (ref 135–145)

## 2018-12-18 LAB — CBC
HCT: 33.1 % — ABNORMAL LOW (ref 39.0–52.0)
Hemoglobin: 10.9 g/dL — ABNORMAL LOW (ref 13.0–17.0)
MCH: 30.5 pg (ref 26.0–34.0)
MCHC: 32.9 g/dL (ref 30.0–36.0)
MCV: 92.7 fL (ref 80.0–100.0)
Platelets: 141 10*3/uL — ABNORMAL LOW (ref 150–400)
RBC: 3.57 MIL/uL — ABNORMAL LOW (ref 4.22–5.81)
RDW: 21 % — ABNORMAL HIGH (ref 11.5–15.5)
WBC: 8.3 10*3/uL (ref 4.0–10.5)
nRBC: 0 % (ref 0.0–0.2)

## 2018-12-18 LAB — GLUCOSE, CAPILLARY
Glucose-Capillary: 133 mg/dL — ABNORMAL HIGH (ref 70–99)
Glucose-Capillary: 143 mg/dL — ABNORMAL HIGH (ref 70–99)
Glucose-Capillary: 183 mg/dL — ABNORMAL HIGH (ref 70–99)
Glucose-Capillary: 170 mg/dL — ABNORMAL HIGH (ref 70–99)
Glucose-Capillary: 201 mg/dL — ABNORMAL HIGH (ref 70–99)

## 2018-12-18 LAB — TROPONIN I (HIGH SENSITIVITY)
Troponin I (High Sensitivity): 133 ng/L (ref <18)
Troponin I (High Sensitivity): 340 ng/L (ref <18)
Troponin I (High Sensitivity): 7 ng/L (ref <18)

## 2018-12-18 LAB — URINALYSIS, ROUTINE W REFLEX MICROSCOPIC
Bacteria, UA: NONE SEEN
Bilirubin Urine: NEGATIVE
Glucose, UA: NEGATIVE mg/dL
Ketones, ur: NEGATIVE mg/dL
Leukocytes,Ua: NEGATIVE
Nitrite: NEGATIVE
Protein, ur: NEGATIVE mg/dL
RBC / HPF: >50 RBC/hpf — ABNORMAL HIGH (ref 0–5)
Specific Gravity, Urine: 1.017 (ref 1.005–1.030)
Squamous Epithelial / HPF: NONE SEEN (ref 0–5)
pH: 5 (ref 5.0–8.0)

## 2018-12-18 LAB — MRSA PCR SCREENING: MRSA by PCR: NEGATIVE

## 2018-12-18 LAB — RESPIRATORY PANEL BY RT PCR (FLU A&B, COVID)
Influenza A by PCR: NEGATIVE
Influenza B by PCR: NEGATIVE
SARS Coronavirus 2 by RT PCR: NEGATIVE

## 2018-12-18 LAB — LACTIC ACID, PLASMA
Lactic Acid, Venous: 3.3 mmol/L (ref 0.5–1.9)
Lactic Acid, Venous: 3.9 mmol/L (ref 0.5–1.9)

## 2018-12-18 LAB — PROCALCITONIN: Procalcitonin: 0.39 ng/mL

## 2018-12-18 LAB — HEMOGLOBIN A1C
Hgb A1c MFr Bld: 6.3 % — ABNORMAL HIGH (ref 4.8–5.6)
Mean Plasma Glucose: 134.11 mg/dL

## 2018-12-18 MED ORDER — APIXABAN 2.5 MG PO TABS
2.5000 mg | ORAL_TABLET | Freq: Two times a day (BID) | ORAL | Status: DC
  Administered 2018-12-18: 21:00:00 2.5 mg via ORAL
  Filled 2018-12-18 (×2): qty 1

## 2018-12-18 MED ORDER — ISOSORBIDE MONONITRATE ER 30 MG PO TB24
30.0000 mg | ORAL_TABLET | Freq: Every morning | ORAL | Status: DC
  Administered 2018-12-18: 30 mg via ORAL
  Filled 2018-12-18 (×2): qty 1

## 2018-12-18 MED ORDER — SODIUM CHLORIDE 0.45 % IV SOLN: INTRAVENOUS | Status: DC

## 2018-12-18 MED ORDER — BUDESONIDE 0.5 MG/2ML IN SUSP
0.5000 mg | Freq: Two times a day (BID) | RESPIRATORY_TRACT | Status: DC
  Administered 2018-12-18 – 2018-12-26 (×15): 0.5 mg via RESPIRATORY_TRACT
  Filled 2018-12-18 (×16): qty 2

## 2018-12-18 MED ORDER — INSULIN ASPART 100 UNIT/ML ~~LOC~~ SOLN
0.0000 [IU] | Freq: Three times a day (TID) | SUBCUTANEOUS | Status: DC
  Administered 2018-12-18: 3 [IU] via SUBCUTANEOUS
  Administered 2018-12-18 (×2): 4 [IU] via SUBCUTANEOUS
  Filled 2018-12-18 (×3): qty 1

## 2018-12-18 MED ORDER — MAGNESIUM OXIDE 400 (241.3 MG) MG PO TABS
400.0000 mg | ORAL_TABLET | Freq: Every evening | ORAL | Status: DC
  Administered 2018-12-18: 400 mg via ORAL
  Filled 2018-12-18: qty 1

## 2018-12-18 MED ORDER — LACTATED RINGERS IV BOLUS
500.0000 mL | Freq: Once | INTRAVENOUS | Status: AC
  Administered 2018-12-18: 500 mL via INTRAVENOUS

## 2018-12-18 MED ORDER — AZITHROMYCIN 500 MG PO TABS
500.0000 mg | ORAL_TABLET | Freq: Every day | ORAL | Status: AC
  Administered 2018-12-18 – 2018-12-22 (×5): 500 mg via ORAL
  Filled 2018-12-18 (×5): qty 1

## 2018-12-18 MED ORDER — IPRATROPIUM-ALBUTEROL 0.5-2.5 (3) MG/3ML IN SOLN
3.0000 mL | RESPIRATORY_TRACT | Status: DC
  Administered 2018-12-18 – 2018-12-26 (×48): 3 mL via RESPIRATORY_TRACT
  Filled 2018-12-18 (×48): qty 3

## 2018-12-18 MED ORDER — APIXABAN 5 MG PO TABS
5.0000 mg | ORAL_TABLET | Freq: Two times a day (BID) | ORAL | Status: DC
  Administered 2018-12-18: 13:00:00 5 mg via ORAL
  Filled 2018-12-18 (×2): qty 1

## 2018-12-18 MED ORDER — ATENOLOL 25 MG PO TABS
50.0000 mg | ORAL_TABLET | Freq: Every morning | ORAL | Status: DC
  Administered 2018-12-18: 50 mg via ORAL
  Filled 2018-12-18 (×2): qty 2

## 2018-12-18 MED ORDER — ACETAMINOPHEN 325 MG PO TABS
650.0000 mg | ORAL_TABLET | Freq: Three times a day (TID) | ORAL | Status: DC
  Administered 2018-12-18 – 2018-12-19 (×5): 650 mg via ORAL
  Filled 2018-12-18 (×6): qty 2

## 2018-12-18 MED ORDER — METHOCARBAMOL 750 MG PO TABS
750.0000 mg | ORAL_TABLET | Freq: Three times a day (TID) | ORAL | Status: DC | PRN
  Filled 2018-12-18: qty 1

## 2018-12-18 MED ORDER — ACETAMINOPHEN 650 MG RE SUPP
650.0000 mg | Freq: Once | RECTAL | Status: AC
  Administered 2018-12-18: 09:00:00 650 mg via RECTAL

## 2018-12-18 MED ORDER — CHLORHEXIDINE GLUCONATE CLOTH 2 % EX PADS
6.0000 | MEDICATED_PAD | Freq: Every day | CUTANEOUS | Status: DC
  Administered 2018-12-20 – 2018-12-24 (×5): 6 via TOPICAL

## 2018-12-18 MED ORDER — ASPIRIN EC 81 MG PO TBEC
81.0000 mg | DELAYED_RELEASE_TABLET | Freq: Every day | ORAL | Status: DC
  Filled 2018-12-18: qty 1

## 2018-12-18 MED ORDER — TAMSULOSIN HCL 0.4 MG PO CAPS
0.4000 mg | ORAL_CAPSULE | Freq: Every day | ORAL | Status: DC
  Administered 2018-12-18: 0.4 mg via ORAL
  Filled 2018-12-18 (×3): qty 1

## 2018-12-18 MED ORDER — IPRATROPIUM-ALBUTEROL 0.5-2.5 (3) MG/3ML IN SOLN
RESPIRATORY_TRACT | Status: AC
  Filled 2018-12-18: qty 3

## 2018-12-18 MED ORDER — OXYCODONE-ACETAMINOPHEN 7.5-325 MG PO TABS: 1.0000 | ORAL_TABLET | Freq: Four times a day (QID) | ORAL | Status: DC | PRN

## 2018-12-18 MED ORDER — NITROGLYCERIN 0.4 MG SL SUBL: 0.4000 mg | SUBLINGUAL_TABLET | SUBLINGUAL | Status: DC | PRN

## 2018-12-18 MED ORDER — INSULIN GLARGINE 100 UNIT/ML ~~LOC~~ SOLN
26.0000 [IU] | Freq: Every day | SUBCUTANEOUS | Status: DC
  Administered 2018-12-18 – 2018-12-22 (×5): 26 [IU] via SUBCUTANEOUS
  Filled 2018-12-18 (×7): qty 0.26

## 2018-12-18 MED ORDER — AMIODARONE HCL IN DEXTROSE 360-4.14 MG/200ML-% IV SOLN
30.0000 mg/h | INTRAVENOUS | Status: DC
  Administered 2018-12-18 – 2018-12-26 (×17): 30 mg/h via INTRAVENOUS
  Filled 2018-12-18 (×16): qty 200

## 2018-12-18 MED ORDER — SODIUM CHLORIDE 0.9 % IV SOLN: 2.0000 g | INTRAVENOUS | Status: DC

## 2018-12-18 MED ORDER — FUROSEMIDE 40 MG PO TABS
40.0000 mg | ORAL_TABLET | Freq: Every morning | ORAL | Status: DC
  Filled 2018-12-18: qty 1

## 2018-12-18 MED ORDER — SULFAMETHOXAZOLE-TRIMETHOPRIM 800-160 MG PO TABS
1.0000 | ORAL_TABLET | Freq: Two times a day (BID) | ORAL | Status: DC
  Filled 2018-12-18 (×3): qty 1

## 2018-12-18 MED ORDER — OSELTAMIVIR PHOSPHATE 75 MG PO CAPS
75.0000 mg | ORAL_CAPSULE | Freq: Two times a day (BID) | ORAL | Status: DC
  Filled 2018-12-18: qty 1

## 2018-12-18 MED ORDER — PANTOPRAZOLE SODIUM 40 MG PO TBEC
40.0000 mg | DELAYED_RELEASE_TABLET | Freq: Every day | ORAL | Status: DC
  Filled 2018-12-18 (×2): qty 1

## 2018-12-18 MED ORDER — AMIODARONE IV BOLUS ONLY 150 MG/100ML
150.0000 mg | Freq: Once | INTRAVENOUS | Status: AC
  Administered 2018-12-18: 150 mg via INTRAVENOUS
  Filled 2018-12-18: qty 100

## 2018-12-18 MED ORDER — SODIUM CHLORIDE 0.9 % IV SOLN
2.0000 g | Freq: Every day | INTRAVENOUS | Status: DC
  Administered 2018-12-18 – 2018-12-20 (×3): 2 g via INTRAVENOUS
  Filled 2018-12-18 (×4): qty 2

## 2018-12-18 MED ORDER — TRAMADOL HCL 50 MG PO TABS: 50.0000 mg | ORAL_TABLET | Freq: Four times a day (QID) | ORAL | Status: DC | PRN

## 2018-12-18 MED ORDER — VANCOMYCIN HCL IN DEXTROSE 1-5 GM/200ML-% IV SOLN: 1000.0000 mg | Freq: Two times a day (BID) | INTRAVENOUS | Status: DC

## 2018-12-18 MED ORDER — VANCOMYCIN HCL 10 G IV SOLR
1500.0000 mg | INTRAVENOUS | Status: AC
  Administered 2018-12-18: 02:00:00 1500 mg via INTRAVENOUS
  Filled 2018-12-18: qty 1500

## 2018-12-18 MED ORDER — AMIODARONE HCL IN DEXTROSE 360-4.14 MG/200ML-% IV SOLN
60.0000 mg/h | INTRAVENOUS | Status: AC
  Administered 2018-12-18: 10:00:00 60 mg/h via INTRAVENOUS
  Filled 2018-12-18: qty 200

## 2018-12-18 MED ORDER — GABAPENTIN 100 MG PO CAPS
100.0000 mg | ORAL_CAPSULE | Freq: Three times a day (TID) | ORAL | Status: DC
  Administered 2018-12-18 (×2): 100 mg via ORAL
  Filled 2018-12-18 (×4): qty 1

## 2018-12-18 MED ORDER — LACTATED RINGERS IV BOLUS
1000.0000 mL | Freq: Once | INTRAVENOUS | Status: AC
  Administered 2018-12-18: 1000 mL via INTRAVENOUS

## 2018-12-18 MED ORDER — SENNA 8.6 MG PO TABS
1.0000 | ORAL_TABLET | Freq: Two times a day (BID) | ORAL | Status: DC
  Administered 2018-12-18: 8.6 mg via ORAL
  Filled 2018-12-18 (×3): qty 1

## 2018-12-18 MED ORDER — SODIUM CHLORIDE 0.9 % IV SOLN
0.0000 ug/min | INTRAVENOUS | Status: DC
  Administered 2018-12-18: 20 ug/min via INTRAVENOUS
  Administered 2018-12-18 (×2): 100 ug/min via INTRAVENOUS
  Administered 2018-12-19: 25 ug/min via INTRAVENOUS
  Administered 2018-12-19: 70 ug/min via INTRAVENOUS
  Filled 2018-12-18: qty 10
  Filled 2018-12-18 (×2): qty 1
  Filled 2018-12-18: qty 10
  Filled 2018-12-18 (×2): qty 1

## 2018-12-18 MED ORDER — ATORVASTATIN CALCIUM 20 MG PO TABS
20.0000 mg | ORAL_TABLET | Freq: Every evening | ORAL | Status: DC
  Administered 2018-12-18 – 2018-12-23 (×6): 20 mg via ORAL
  Filled 2018-12-18 (×6): qty 1

## 2018-12-18 NOTE — Progress Notes (Signed)
Foley catheter unsuccessful, met resitance. Dr. Mortimer Fries notified. Stated we could insert coude, looking for someone certified to complete. Lupita Leash

## 2018-12-18 NOTE — Progress Notes (Signed)
OT Cancellation Note  Patient Details Name: Carlos Obrien MRN: [id number] DOB: 1934-12-23   Cancelled Treatment:    Reason Eval/Treat Not Completed: Patient not medically ready  OT consult received and chart reviewed. Upon chart review, pt noted to have critically elevated troponin and trending up (133 ng/L at 0848 and 340 ng/L at 1115). In addition, pt with elevated K+ of 5.3 mmol/L. Will withhold OT evaluation at this time and f/u as pt becomes more appropriate/medically ready. Thank you.    Gerrianne Scale, [place], OTR/L ascom [phone] 12/18/18, 1:11 PM

## 2018-12-18 NOTE — Progress Notes (Signed)
Rapid Response Event Note  Overview: Pt with elevated temp, HR, BP and low O2. (see flowsheet)     Interventions: Rapid response called, MD Amery @ bedside  Plan of Care: transferred to ICU         Nadara Eaton

## 2018-12-18 NOTE — Progress Notes (Signed)
CH responded to RR regarding pt that is a 84 y.o. male that is febrile and presented to be doing primarily abdominal breathing. Pt was transferred to [facility] per [facility] clearance.  F/u recommended with family   12/18/18 0800  Clinical Encounter Type  Visited With Patient;Health care provider  Visit Type Code;Critical Care  Referral From Nurse  Consult/Referral To Chaplain  Spiritual Encounters  Spiritual Needs Emotional  Stress Factors  Patient Stress Factors Exhausted;Health changes  Family Stress Factors None identified

## 2018-12-18 NOTE — Progress Notes (Signed)
PT Cancellation Note  Patient Details Name: IGNACIO LOWDER MRN: [id number] DOB: Jul 11, 1934   Cancelled Treatment:    Reason Eval/Treat Not Completed: Other (comment). Consult received and chart reviewed. Pt currently with elevated vitals, currently on amio drip, and demonstrates abnormal lab values ex- troponin and K+. Pt is currently not appropriate for PT at this time. Will hold and re-attempt next date, if appropriate.   Gita Dilger 12/18/2018, 2:06 PM Greggory Stallion, PT, DPT [phone]

## 2018-12-18 NOTE — Plan of Care (Signed)
New plan of care initiated

## 2018-12-18 NOTE — Progress Notes (Signed)
Dr. Mortimer Fries notified of critical troponin. No new current orders. Lupita Leash

## 2018-12-18 NOTE — H&P (Signed)
History and Physical    Carlos Obrien DVV:[id number] DOB: [birth year] DOA: 12/20/2018  PCP: Idelle Crouch, MD (Confirm with patient/family/NH records and if not entered, this has to be entered at [facility] point of entry) Patient coming from: coming from home  I have personally briefly reviewed patient's old medical records in [place]  Chief Complaint: Weakness, flu-like symptoms  HPI: Carlos Obrien is a 83 y.o. male with medical history significant of DM with hyperlipidemia,a. Fib, h/o CHF, h/o prostate cancer, h/o CAD. He reports that for 1-2 days he has been feeling sick with weakness, diffuse myalgias and decreased PO intake. His wife is at home and has been diagnosed with pneumonia. He reports he has been taking his temperature at home and reports it has been normal. He has been treated for UTI 2/2 urinary frequency. Because of progressive weakness he presents to [facility]ED for evalaution.  (For level 3, the HPI must include 4+ descriptors: Location, Quality, Severity, Duration, Timing, Context, modifying factors, associated signs/symptoms and/or status of 3+ chronic problems.)  (Please avoid self-populating past medical history here) (The initial 2-3 lines should be focused and good to copy and paste in the HPI section of the daily progress note).  ED Course: In the ED he is febrile to 101.6 F, tachycardic to 120-130, mildly hypertensice. Code sepsis initiated: blood cultures drawn, abx with Vanco, cefipieme and Flagyl. IV fluid bolus administered. Labs were remarkable for Lactic acid 4.6, Cr 2.55, normal WBC 4.5 with a normal diff, normal procalcitonin.  CXR with mild interstial prominence w/o pul edema or frank infiltrate. Covid rapid Ag test negative, PCR covid testing pending.TRH called to admit for further management.  Review of Systems: As per HPI otherwise 10 point review of systems negative. C/o inability to control urinary leakage but cannot initiate a good stream. Denies  Abdlominal pain, N/V, dysuria. Denies rigors   Past Medical History:  Diagnosis Date  . Anemia    hx  . Bilateral lower extremity edema   . Bladder calculi   . BPH (benign prostatic hyperplasia)   . CKD (chronic kidney disease)   . Coronary artery disease    cardiologist-- dr Loney Laurence [facility])  lov note in care everywhere tab in epic  . DDD (degenerative disc disease), lumbar   . Diabetes mellitus without complication ([place])   . Diastolic CHF, chronic ([place])   . Dyspnea    exersion  . Dysrhythmia    atrial fib  . GERD (gastroesophageal reflux disease)   . History of adenomatous polyp of colon    2012  . History of Barrett's esophagus   . History of basal cell carcinoma excision    Sept 2016  --scalp  . History of colon cancer    1999  adenocarcinoma polyp  . History of hiatal hernia   . History of kidney stones   . History of kidney stones   . History of MI (myocardial infarction)    1996  . Hyperlipidemia   . Hypertension   . PAF (paroxysmal atrial fibrillation) ([place])    dx 2015  . Prostate cancer ([place])   . Urethral stricture   . Urinary incontinence     Past Surgical History:  Procedure Laterality Date  . BLEPHAROPLASTY Bilateral Fall 2015  . CORONARY ANGIOPLASTY  May 1996  dr fath [facility] date verified w/ medical records)   per cardiologist note in epic (dr Loney Laurence)  2 vessel CAD-- occluded RCA and 60% LAD  . CYSTOSCOPY WITH  HOLMIUM LASER LITHOTRIPSY N/A 01/08/2015   Procedure: CYSTOSCOPY WITH REMOVAL OF BLADDER STONE;  Surgeon: Rana Snare, MD;  Location: [facility];  Service: Urology;  Laterality: N/A;  . CYSTOSCOPY WITH RETROGRADE PYELOGRAM, URETEROSCOPY AND STENT PLACEMENT  Aug 2015  . CYSTOSCOPY WITH URETHRAL DILATATION N/A 01/08/2015   Procedure: CYSTOSCOPY WITH URETHRAL DILATATION;  Surgeon: Rana Snare, MD;  Location: [facility];  Service: Urology;  Laterality: N/A;  . LUMBAR LAMINECTOMY/DECOMPRESSION MICRODISCECTOMY  N/A 02/09/2016   Procedure: Lumbar four  to Sacral one Laminectomy for decompression;  Surgeon: Kevan Ny Ditty, MD;  Location: [place];  Service: Neurosurgery;  Laterality: N/A;  L4 to S1 Laminectomy for decompression  . PARTIAL KNEE ARTHROPLASTY Right 11-08-2012  . TRANSTHORACIC ECHOCARDIOGRAM  10-22-2013   mild concentric LVH,  grade 1 diastolic dysfunction,  ef 50-55%,  mild MR and TR,  trivial PR   Soc Hx - , married x 3: 15 yrs, 10 years, 20 years to present wife. No children. 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 Now retired. Lives with his wife and they are independent.   reports that he has never smoked. He has never used smokeless tobacco. He reports that he does not drink alcohol or use drugs.  Allergies  Allergen Reactions  . No Known Allergies     History reviewed. No pertinent family history. Unacceptable: Noncontributory, unremarkable, or negative. Acceptable: Family history reviewed and not pertinent (If you reviewed it)  Prior to Admission medications   Medication Sig Start Date End Date Taking? Authorizing Provider  apixaban (ELIQUIS) 5 MG TABS tablet Take 1 tablet (5 mg total) by mouth 2 (two) times daily. 02/13/16   Costella, Vista Mink, PA-C  aspirin 81 MG EC tablet Take 81 mg by mouth daily.     [provider]  atenolol (TENORMIN) 50 MG tablet Take 50 mg by mouth every morning.     [provider]  atorvastatin (LIPITOR) 20 MG tablet Take 20 mg by mouth every evening.     [provider]  Cholecalciferol (VITAMIN D) 2000 units tablet Take 2,000 Units by mouth daily.    [provider]  furosemide (LASIX) 20 MG tablet Take 40 mg by mouth every morning.     [provider]  gabapentin (NEURONTIN) 300 MG capsule Take 100 mg by mouth 3 (three) times daily.     [provider]  glipiZIDE (GLUCOTROL) 5 MG tablet Take 5 mg by mouth 2 (two) times daily.     [provider]   Insulin Glargine (LANTUS SOLOSTAR) 100 UNIT/ML Solostar Pen Inject 26 Units into the skin at bedtime.  03/31/15   [provider]  isosorbide mononitrate (IMDUR) 30 MG 24 hr tablet Take 30 mg by mouth every morning.     [provider]  Liraglutide (VICTOZA) 18 MG/3ML SOPN Inject 1.8 mg into the skin every morning.     [provider]  magnesium oxide (MAG-OX) 400 MG tablet Take 400 mg by mouth every evening.     [provider]  meloxicam (MOBIC) 7.5 MG tablet Take 7.5 mg by mouth daily. 10/04/18   [provider]  metFORMIN (GLUCOPHAGE) 500 MG tablet Take 1,000 mg by mouth 2 (two) times daily with a meal.     [provider]  methocarbamol (ROBAXIN-750) 750 MG tablet Take 1 tablet (750 mg total) by mouth 3 (three) times daily as needed for muscle spasms. 02/10/16   Costella,  Vista Mink, PA-C  nitroGLYCERIN (NITROSTAT) 0.4 MG SL tablet Place 0.4 mg under the tongue every 5 (five) minutes as needed for chest pain.    [provider]  omeprazole (PRILOSEC) 40 MG capsule Take 40 mg by mouth every morning.     [provider]  oxyCODONE-acetaminophen (PERCOCET) 7.5-325 MG tablet Take 1-2 tablets by mouth as needed for pain. 02/10/16   Costella, Vista Mink, PA-C  pioglitazone (ACTOS) 30 MG tablet Take 30 mg by mouth every morning.     [provider]  ramipril (ALTACE) 2.5 MG capsule Take 2.5 mg by mouth every morning.     [provider]  silodosin (RAPAFLO) 8 MG CAPS capsule Take 8 mg by mouth daily with breakfast.    [provider]  sulfamethoxazole-trimethoprim (BACTRIM DS) 800-160 MG tablet Take 1 tablet by mouth 2 (two) times daily. 12/12/18 12/19/18  [provider]  vitamin B-12 (CYANOCOBALAMIN) 1000 MCG tablet Take 1,000 mcg by mouth daily.    [provider]    Physical Exam: Vitals:   12/08/2018 2125 12/31/2018 2130 12/31/2018 2330 01/01/2019 2333  BP:  (!) 156/78  119/78  Pulse:  93 (!)  117 (!) 142  Resp:  (!) 23 (!) 26 (!) 21  Temp:   (!) 101.6 F (38.7 C)   TempSrc:      SpO2:  90% 96% 93%  Weight: 133.8 kg       Constitutional: NAD, calm, comfortable Vitals:   12/15/2018 2125 12/28/2018 2130 01/02/2019 2330 12/11/2018 2333  BP:  (!) 156/78  119/78  Pulse:  93 (!) 117 (!) 142  Resp:  (!) 23 (!) 26 (!) 21  Temp:   (!) 101.6 F (38.7 C)   TempSrc:      SpO2:  90% 96% 93%  Weight: 133.8 kg      General appearance: -  Obese man in no acute distress. Denies being in pain. Eyes: PERRL, lids and conjunctivae normal ENMT: Mucous membranes are moist. Posterior pharynx clear of any exudate or lesions.Normal dentition with partial denture maxilla. Neck: obese, supple, no masses, no thyromegaly Respiratory: clear to auscultation bilaterally, no wheezing, no crackles. Normal respiratory effort. No accessory muscle use.  Cardiovascular: Quiet precordium, 2+ radial pulse,  IRIR tachycardia, no JVD Abdomen:massive obesity hindering manual exam. No guarding or rebound, no tenderness to palpation. Musculoskeletal: no clubbing / cyanosis. No joint deformity upper and lower extremities.  Skin: no rashes, lesions, ulcers. No induration Neurologic: CN 2-12 grossly intact. MAE to command Psychiatric: Normal judgment and insight. Alert and oriented x 3. Normal mood.   (Anything < 9 systems with 2 bullets each down codes to level 1) (If patient refuses exam can't bill higher level) (Make sure to document decubitus ulcers present on admission -- if possible -- and whether patient has chronic indwelling catheter at time of admission)  Labs on Admission: I have personally reviewed following labs and imaging studies  CBC: Recent Labs  Lab 01/03/2019 2129  WBC 4.5  NEUTROABS 3.3  HGB 10.3*  HCT 31.8*  MCV 91.9  PLT 063*   Basic Metabolic Panel: Recent Labs  Lab 12/06/2018 2129  NA 137  K 4.9  CL 103  CO2 21*  GLUCOSE 103*  BUN 46*  CREATININE 2.55*  CALCIUM 8.9   GFR: CrCl  cannot be calculated (Unknown ideal weight.). Liver Function Tests: Recent Labs  Lab 12/13/2018 2129  AST 47*  ALT 25  ALKPHOS 76  BILITOT 0.9  PROT 6.6  ALBUMIN 3.5   No results for input(s): LIPASE, AMYLASE in the last 168 hours. No results for input(s): AMMONIA in the last 168 hours. Coagulation Profile: Recent Labs  Lab 12/16/2018 2129  INR 1.7*   Cardiac Enzymes: No results for input(s): CKTOTAL, CKMB, CKMBINDEX, TROPONINI in the last 168 hours. BNP (last 3 results) No results for input(s): PROBNP in the last 8760 hours. HbA1C: No results for input(s): HGBA1C in the last 72 hours. CBG: No results for input(s): GLUCAP in the last 168 hours. Lipid Profile: No results for input(s): CHOL, HDL, LDLCALC, TRIG, CHOLHDL, LDLDIRECT in the last 72 hours. Thyroid Function Tests: No results for input(s): TSH, T4TOTAL, FREET4, T3FREE, THYROIDAB in the last 72 hours. Anemia Panel: No results for input(s): VITAMINB12, FOLATE, FERRITIN, TIBC, IRON, RETICCTPCT in the last 72 hours. Urine analysis: No results found for: COLORURINE, APPEARANCEUR, LABSPEC, PHURINE, GLUCOSEU, HGBUR, BILIRUBINUR, KETONESUR, PROTEINUR, UROBILINOGEN, NITRITE, LEUKOCYTESUR  Radiological Exams on Admission: DG Chest Port 1 View  Result Date: 12/24/2018 CLINICAL DATA:  Initial evaluation for acute fever, cough, congestion. EXAM: PORTABLE CHEST 1 VIEW COMPARISON:  Prior CT from 06/20/2017 FINDINGS: Mild cardiomegaly. Mediastinal silhouette within normal limits. Lungs hypoinflated. Mild diffuse interstitial prominence, which could reflect interstitial congestion and/or atypical pneumonitis. No consolidative opacity identified. No frank alveolar edema. No pleural effusion. No pneumothorax. No acute osseous finding. IMPRESSION: 1. Mild diffuse interstitial prominence, which could reflect interstitial congestion and/or atypical pneumonitis. No consolidative opacity identified. 2. Mild cardiomegaly without frank edema.  Electronically Signed   By: Jeannine Boga M.D.   On: 12/08/2018 21:50    EKG: Independently reviewed. A. Fib with rapid rate  Assessment/Plan Active Problems:   Fever   AKI (acute kidney injury) ([place])   Weakness   Type 2 diabetes mellitus with hyperlipidemia ([place])  (please populate well all problems here in Problem List. (For example, if patient is on BP meds at home and you resume or decide to hold them, it is a problem that needs to be her. Same for CAD, COPD, HLD and so on)   1. Fever - no source of infection identified. No productive cough, unremarkable CXR, normal WBC with normal diff. Procalcitonin nl. Lactic acid elevated. Patient does admit to myalgias. He has exposure from spouse to influenza. He has received abx. Suspect inluenza Plan Covid PCR pending  Influenza testing pending  Will hold additional abx since no source of infection identified  Tamiflu 75 mg bid  APAP q 6 for fever control  2. AKI - rise in creatinine from 1.6 baseline to 2.55. Suspect dehydration related to fever and poor intake. Has received fluid bolus in ED Plan Continue IVF at 50 cc/hr  F/u Bmet  3. DM - patient on multiple oral agents, basal insulin. Plan Continue basal insulin  Glycemic order set: ss coverage  4. A. Fib - will continue home regime and NOAC  5. H/o CHF - last Echo 04/11/18 with LVEF 55% Plan Gentle IV hydration  Daily weights, strick I&Os  6. GU - h/o prostate cancer, also BPH on medication. He has a slow stream and chronic leakage. Per his wife due to increased leakage he was empirically started on Septra for UTI. Plan U/A pending.       DVT prophylaxis: Continue NOAC (Lovenox/Heparin/SCD's/anticoagulated/None (if comfort care) Code Status: full code (Full/Partial (specify details) Family Communication: spoke at length with wife - she is very worried. Reassured her as possible. (Specify name, relationship. Do not write "discussed with patient". Specify tel #  if  discussed over the phone) Disposition Plan: home when stable (specify when and where you expect patient to be discharged) Consults called: none (with names) Admission status: inpatient-tele (inpatient / obs / tele / medical floor / SDU)   Adella Hare MD [facility] Pager [phone]  If 7PM-7AM, please contact night-coverage [email or web address] Password [facility]  12/18/2018, 12:28 AM

## 2018-12-18 NOTE — Progress Notes (Signed)
Pharmacy Antibiotic Note  Carlos Obrien is a 84 y.o. male admitted on 12/22/2018 with pneumonia. Patient presented through ED with 1-2 days of feeling sick at home. Patient admitted to ICU with sepsis work up with tachycadia fever, and hypertension. Patient prevously started on Bactrim DS BID on 12/8 by outpatient MD. Pharmacy has been consulted for cefepime dosing.  Plan: Cefepime 2g IV Q24hr.   Height: 6\' 4"  (193 cm) Weight: 298 lb 11.6 oz (135.5 kg) IBW/kg (Calculated) : 86.8  Temp (24hrs), Avg:101.5 F (38.6 C), Min:99.2 F (37.3 C), Max:103.1 F (39.5 C)  Recent Labs  Lab 12/24/2018 2129 12/16/2018 2341 12/18/18 0848 12/18/18 0858  WBC 4.5  --  8.3  --   CREATININE 2.55*  --  2.35*  --   LATICACIDVEN 4.6* 3.3*  --  3.9*    Estimated Creatinine Clearance: 36.4 mL/min (A) (by C-G formula based on SCr of 2.35 mg/dL (H)).    Allergies  Allergen Reactions  . No Known Allergies     Antimicrobials this admission: Vancomycin 12/13 >> 12/14 Metronidazole 12/13 x 1 Cefepime 12/13 >>   Dose adjustments this admission: N/A  Microbiology results: 12/13 BCx: no growth < 12 hours  12/14 UCx: pending  12/14 MRSA PCR: negative  12/13 Influenza A & B: negative   Thank you for allowing pharmacy to be a part of this patient's care.  Jesse Nosbisch L 12/18/2018 2:18 PM

## 2018-12-18 NOTE — Progress Notes (Signed)
  Amiodarone Drug - Drug Interaction Consult Note  Recommendations:  Continue to monitor for signs of adverse reactions.   Amiodarone is metabolized by the cytochrome P450 system and therefore has the potential to cause many drug interactions. Amiodarone has an average plasma half-life of 50 days (range 20 to 100 days).   There is potential for drug interactions to occur several weeks or months after stopping treatment and the onset of drug interactions may be slow after initiating amiodarone.   [x]  Drugs that prolong the QT interval:  Torsades de pointes risk may be increased with concurrent use - avoid if possible.  Monitor QTc, also keep magnesium/potassium WNL if concurrent therapy can't be avoided. Marland Kitchen Antibiotics: azithromycin  Thank You,  Aria Jarrard L  12/18/2018 4:42 PM

## 2018-12-18 NOTE — Progress Notes (Signed)
D/w EMD concerning CMS guidelines and patient requiring 4L IVF resuscitation. EMD feels that is excessive and agrees to document rationale. Will follow Vital signs and for lactic acid clearance

## 2018-12-18 NOTE — Progress Notes (Signed)
PROGRESS NOTE    Carlos Obrien  HDQ:[id number] DOB: [birth year] DOA: 12/21/2018 PCP: Carlos Crouch, MD    Brief Narrative:  Carlos Obrien is a 84 y.o. male with medical history significant of DM with hyperlipidemia,a. Fib, h/o CHF, h/o prostate cancer, h/o CAD. He reports that for 1-2 days he has been feeling sick with weakness, diffuse myalgias and decreased PO intake. His wife is at home and has been diagnosed with pneumonia. He reports he has been taking his temperature at home and reports it has been normal. He has been treated for UTI 2/2 urinary frequency. Because of progressive weakness he presents to [facility] for evalaution.In the ED he is febrile to 101.6 F, tachycardic to 120-130, mildly hypertensice. Code sepsis initiated: blood cultures drawn, abx with Vanco, cefipieme and Flagyl. IV fluid bolus administered. Labs were remarkable for Lactic acid 4.6, Cr 2.55, normal WBC 4.5 with a normal diff, normal procalcitonin.  CXR with mild interstial prominence w/o pul edema or frank infiltrate. Covid rapid Ag test negative,   Consultants:   Critical care  Procedures: None  Antimicrobials:   Vanco and cefepime   Subjective: Patient seen and examined earlier as nursing call patient had desatted in the 80s with 2 L of oxygen, febrile at 103 and elevated respiratory rate.  Patient is somewhat lethargic.  Unable to answer all questions well but denies any worsening shortness of breath or chest pain.  Objective: Vitals:   12/18/18 0946 12/18/18 0955 12/18/18 1000 12/18/18 1101  BP: 108/86  127/70   Pulse: (!) 119 (!) 137 86   Resp: (!) 27 (!) 27 (!) 26   Temp:  (!) 103 F (39.4 C)    TempSrc:  Oral    SpO2: 96% 95% 97% 95%  Weight:   135.5 kg   Height:   6\' 4"  (1.93 m)     Intake/Output Summary (Last 24 hours) at 12/18/2018 1509 Last data filed at 12/18/2018 1400 Gross per 24 hour  Intake 623.51 ml  Output --  Net 623.51 ml   Filed Weights   12/14/2018 2125 12/18/18 0500  12/18/18 1000  Weight: 133.8 kg (!) 137.1 kg 135.5 kg    Examination:  General exam: Appears calm and comfortable  Respiratory system: Clear to auscultation. Respiratory effort normal. Cardiovascular system: S1 & S2 heard, RRR. No JVD, murmurs, rubs, gallops or clicks. No pedal edema. Gastrointestinal system: Abdomen is nondistended, soft and nontender. No organomegaly or masses felt. Normal bowel sounds heard. Central nervous system: Alert and oriented. No focal neurological deficits. Extremities: Symmetric 5 x 5 power. Skin: No rashes, lesions or ulcers Psychiatry: Judgement and insight appear normal. Mood & affect appropriate.     Data Reviewed: I have personally reviewed following labs and imaging studies  CBC: Recent Labs  Lab 01/01/2019 2129 12/18/18 0848  WBC 4.5 8.3  NEUTROABS 3.3  --   HGB 10.3* 10.9*  HCT 31.8* 33.1*  MCV 91.9 92.7  PLT 120* 119*   Basic Metabolic Panel: Recent Labs  Lab 12/25/2018 2129 12/18/18 0848  NA 137 135  K 4.9 5.3*  CL 103 103  CO2 21* 18*  GLUCOSE 103* 169*  BUN 46* 39*  CREATININE 2.55* 2.35*  CALCIUM 8.9 8.7*   GFR: Estimated Creatinine Clearance: 36.4 mL/min (A) (by C-G formula based on SCr of 2.35 mg/dL (H)). Liver Function Tests: Recent Labs  Lab 01/04/2019 2129  AST 47*  ALT 25  ALKPHOS 76  BILITOT 0.9  PROT 6.6  ALBUMIN 3.5   No results for input(s): LIPASE, AMYLASE in the last 168 hours. No results for input(s): AMMONIA in the last 168 hours. Coagulation Profile: Recent Labs  Lab 12/10/2018 2129  INR 1.7*   Cardiac Enzymes: No results for input(s): CKTOTAL, CKMB, CKMBINDEX, TROPONINI in the last 168 hours. BNP (last 3 results) No results for input(s): PROBNP in the last 8760 hours. HbA1C: Recent Labs    12/11/2018 2129  HGBA1C 6.3*   CBG: Recent Labs  Lab 12/18/18 0806 12/18/18 0915 12/18/18 1220  GLUCAP 133* 143* 183*   Lipid Profile: No results for input(s): CHOL, HDL, LDLCALC, TRIG, CHOLHDL,  LDLDIRECT in the last 72 hours. Thyroid Function Tests: No results for input(s): TSH, T4TOTAL, FREET4, T3FREE, THYROIDAB in the last 72 hours. Anemia Panel: No results for input(s): VITAMINB12, FOLATE, FERRITIN, TIBC, IRON, RETICCTPCT in the last 72 hours. Sepsis Labs: Recent Labs  Lab 12/18/2018 2129 12/30/2018 2341 12/18/18 0848 12/18/18 0858  PROCALCITON 0.11  --  0.39  --   LATICACIDVEN 4.6* 3.3*  --  3.9*    Recent Results (from the past 240 hour(s))  Blood Culture (routine x 2)     Status: None (Preliminary result)   Collection Time: 12/12/2018  9:20 PM   Specimen: BLOOD  Result Value Ref Range Status   Specimen Description BLOOD RIGHT HAND  Final   Special Requests   Final    BOTTLES DRAWN AEROBIC AND ANAEROBIC Blood Culture adequate volume   Culture   Final    NO GROWTH < 12 HOURS Performed at [facility], [address], [place], [place] [address]    Report Status PENDING  Incomplete  Blood Culture (routine x 2)     Status: None (Preliminary result)   Collection Time: 12/09/2018  9:29 PM   Specimen: BLOOD  Result Value Ref Range Status   Specimen Description BLOOD RIGHT ANTECUBITAL  Final   Special Requests   Final    BOTTLES DRAWN AEROBIC AND ANAEROBIC Blood Culture adequate volume   Culture   Final    NO GROWTH < 12 HOURS Performed at [facility], [address] [place], [place] [address]    Report Status PENDING  Incomplete  Respiratory Panel by RT PCR (Flu A&B, Covid) -     Status: None   Collection Time: 12/19/2018 11:41 PM  Result Value Ref Range Status   SARS Coronavirus 2 by RT PCR NEGATIVE NEGATIVE Final    Comment: (NOTE) SARS-CoV-2 target nucleic acids are NOT DETECTED. The SARS-CoV-2 RNA is generally detectable in upper respiratoy specimens during the acute phase of infection. The lowest concentration of SARS-CoV-2 viral copies this assay can detect is 131 copies/mL. A negative result does not preclude SARS-Cov-2 infection and  should not be used as the sole basis for treatment or other patient management decisions. A negative result may occur with  improper specimen collection/handling, submission of specimen other than nasopharyngeal swab, presence of viral mutation(s) within the areas targeted by this assay, and inadequate number of viral copies (<131 copies/mL). A negative result must be combined with clinical observations, patient history, and epidemiological information. The expected result is Negative. Fact Sheet for Patients:  PinkCheek.be Fact Sheet for Healthcare Providers:  GravelBags.it This test is not yet ap proved or cleared by the [place] FDA and  has been authorized for detection and/or diagnosis of SARS-CoV-2 by FDA under an Emergency Use Authorization (EUA). This EUA will remain  in effect (meaning this test can be  used) for the duration of the COVID-19 declaration under Section 564(b)(1) of the Act, 21 U.S.C. section 360bbb-3(b)(1), unless the authorization is terminated or revoked sooner.    Influenza A by PCR NEGATIVE NEGATIVE Final   Influenza B by PCR NEGATIVE NEGATIVE Final    Comment: (NOTE) The Xpert Xpress SARS-CoV-2/FLU/RSV assay is intended as an aid in  the diagnosis of influenza from Nasopharyngeal swab specimens and  should not be used as a sole basis for treatment. Nasal washings and  aspirates are unacceptable for Xpert Xpress SARS-CoV-2/FLU/RSV  testing. Fact Sheet for Patients: PinkCheek.be Fact Sheet for Healthcare Providers: GravelBags.it This test is not yet approved or cleared by the [place] FDA and  has been authorized for detection and/or diagnosis of SARS-CoV-2 by  FDA under an Emergency Use Authorization (EUA). This EUA will remain  in effect (meaning this test can be used) for the duration of the  Covid-19 declaration under Section  564(b)(1) of the Act, 21  U.S.C. section 360bbb-3(b)(1), unless the authorization is  terminated or revoked. Performed at [facility], [address] [place], [place] [address]   MRSA PCR Screening     Status: None   Collection Time: 12/18/18  9:14 AM   Specimen: Nasal Mucosa; Nasopharyngeal  Result Value Ref Range Status   MRSA by PCR NEGATIVE NEGATIVE Final    Comment:        The GeneXpert MRSA Assay (FDA approved for NASAL specimens only), is one component of a comprehensive MRSA colonization surveillance program. It is not intended to diagnose MRSA infection nor to guide or monitor treatment for MRSA infections. Performed at [facility], [address], [place],  [address]          Radiology Studies: [facility] Chest [place] 1 View  Result Date: 12/08/2018 CLINICAL DATA:  Initial evaluation for acute fever, cough, congestion. EXAM: PORTABLE CHEST 1 VIEW COMPARISON:  Prior CT from 06/20/2017 FINDINGS: Mild cardiomegaly. Mediastinal silhouette within normal limits. Lungs hypoinflated. Mild diffuse interstitial prominence, which could reflect interstitial congestion and/or atypical pneumonitis. No consolidative opacity identified. No frank alveolar edema. No pleural effusion. No pneumothorax. No acute osseous finding. IMPRESSION: 1. Mild diffuse interstitial prominence, which could reflect interstitial congestion and/or atypical pneumonitis. No consolidative opacity identified. 2. Mild cardiomegaly without frank edema. Electronically Signed   By: Jeannine Boga M.D.   On: 12/24/2018 21:50        Scheduled Meds: . acetaminophen  650 mg Oral TID  . apixaban  5 mg Oral BID  . aspirin EC  81 mg Oral Daily  . atenolol  50 mg Oral q morning - 10a  . atorvastatin  20 mg Oral QPM  . azithromycin  500 mg Oral Daily  . budesonide (PULMICORT) nebulizer solution  0.5 mg Nebulization BID  . Chlorhexidine Gluconate Cloth  6 each Topical Daily  . gabapentin   100 mg Oral TID  . insulin aspart  0-20 Units Subcutaneous TID WC  . insulin glargine  26 Units Subcutaneous QHS  . ipratropium-albuterol  3 mL Nebulization Q4H  . isosorbide mononitrate  30 mg Oral q morning - 10a  . magnesium oxide  400 mg Oral QPM  . pantoprazole  40 mg Oral Daily  . senna  1 tablet Oral BID  . tamsulosin  0.4 mg Oral Daily   Continuous Infusions: . amiodarone 60 mg/hr (12/18/18 0953)   Followed by  . amiodarone    . ceFEPime (MAXIPIME) IV 2 g (12/18/18 1244)  Assessment & Plan:   Active Problems:   Fever   AKI (acute kidney injury) ([place])   Weakness   Type 2 diabetes mellitus with hyperlipidemia ([place])   1.Sepsis- likely PNA I started pt on cefepime and vancomycin Will transer to ICU Critical care consulted Sepsis protocol Oxygen support as needed Repeat lactic acid Influenza and Covid negative Ck bcx, ucx  2.AKI- likely prerenal. Continue on ivf Monitor volume status  3. DM- Ck fs riss  4. afib-  Continue Eliquis and atenolol  5.Hx/o DHF- euvolemic on exam EF 55% on 04/11/18  DVT prophylaxis: Eliqus Code Status:full Family Communication: none Disposition Plan: In transfer to ICU for higher level of care and likely will be here 2 midnight stays       LOS: 0 days   Time spent: 45 minutes with more than 50% COC    Nolberto Hanlon, MD [facility] Pager [phone]  If 7PM-7AM, please contact night-coverage [email or web address] Password [email or web address] 12/18/2018, 3:09 PM

## 2018-12-18 NOTE — ED Notes (Signed)
Pt states he needs to void, incontinent lg amt urine. Cleaned and repositioned. Pt unable to void in urinal with RN at bedside. Urinal postioned so pt may try to void without staff present at bedside

## 2018-12-18 NOTE — Progress Notes (Signed)
   12/18/18 1200  Clinical Encounter Type  Visited With Family;Health care provider  Visit Type Follow-up;Psychological support  Referral From Nurse  Spiritual Encounters  Spiritual Needs Emotional  Stress Factors  Family Stress Factors Loss of control;Major life changes  Ch was paged to provide emotional support to the wife Diane who is struggling with her husband's difficult medical conditions. Wife was feeling weak and needed some food so ch took her to the cafeteria. Ch will continue to provide support until her pastor arrives.

## 2018-12-18 NOTE — Consult Note (Signed)
Name: Carlos Obrien MRN: [id number] DOB: [birth year]      CONSULTATION DATE: 12/18/2018  REFERRING MD : Kurtis Bushman   CHIEF COMPLAINT: SOB    HPI 84 y.o. male with medical history significant of DM with hyperlipidemia,a. Fib, h/o CHF, h/o prostate cancer, h/o CAD. He reports that for 1-2 days he has been feeling sick with weakness, diffuse myalgias and decreased PO intake. His wife is at home and has been diagnosed with pneumonia. He reports he has been taking his temperature at home and reports it has been normal. He has been treated for UTI 2/2 urinary frequency. Because of progressive weakness he presents to [facility] for evalaution.   ED Course: In the ED he is febrile to 101.6 F, tachycardic to 120-130, mildly hypertensice. Code sepsis initiated: blood cultures drawn, abx with Vanco, cefipieme and Flagyl. IV fluid bolus administered. Labs were remarkable for Lactic acid 4.6, Cr 2.55, normal WBC 4.5 with a normal diff, normal procalcitonin.  CXR with mild interstial prominence w/o pul edema or frank infiltrate. Covid rapid Ag test negative, PCR covid testing NEG  Patient was transferred to ICU for increased WOB and fevers and sepsis HR 170, started on amiodarone He alert but lethargic Placed on high flow [place]  High risk for intubation       PAST MEDICAL HISTORY :   has a past medical history of Anemia, Bilateral lower extremity edema, Bladder calculi, BPH (benign prostatic hyperplasia), CKD (chronic kidney disease), Coronary artery disease, DDD (degenerative disc disease), lumbar, Diabetes mellitus without complication ([place]), Diastolic CHF, chronic (HCC), Dyspnea, Dysrhythmia, GERD (gastroesophageal reflux disease), History of adenomatous polyp of colon, History of Barrett's esophagus, History of basal cell carcinoma excision, History of colon cancer, History of hiatal hernia, History of kidney stones, History of kidney stones, History of MI (myocardial infarction), Hyperlipidemia,  Hypertension, PAF (paroxysmal atrial fibrillation) ([place]), Prostate cancer ([place]), Urethral stricture, and Urinary incontinence.  has a past surgical history that includes Partial knee arthroplasty (Right, 11-08-2012); Cystoscopy with retrograde pyelogram, ureteroscopy and stent placement (Aug 2015); transthoracic echocardiogram (10-22-2013); Blepharoplasty (Bilateral, Fall 2015); Cystoscopy with holmium laser lithotripsy (N/A, 01/08/2015); Cystoscopy with urethral dilatation (N/A, 01/08/2015); Coronary angioplasty (May 1996  [facility] date verified w/ medical records)); and Lumbar laminectomy/decompression microdiscectomy (N/A, 02/09/2016). Prior to Admission medications   Medication Sig Start Date End Date Taking? Authorizing Provider  apixaban (ELIQUIS) 5 MG TABS tablet Take 1 tablet (5 mg total) by mouth 2 (two) times daily. 02/13/16   Costella, Vista Mink, PA-C  aspirin 81 MG EC tablet Take 81 mg by mouth daily.     [provider]  atenolol (TENORMIN) 50 MG tablet Take 50 mg by mouth every morning.     [provider]  atorvastatin (LIPITOR) 20 MG tablet Take 20 mg by mouth every evening.     [provider]  Cholecalciferol (VITAMIN D) 2000 units tablet Take 2,000 Units by mouth daily.    [provider]  furosemide (LASIX) 20 MG tablet Take 40 mg by mouth every morning.     [provider]  gabapentin (NEURONTIN) 300 MG capsule Take 100 mg by mouth 3 (three) times daily.     [provider]  glipiZIDE (GLUCOTROL) 5 MG tablet Take 5 mg by mouth 2 (two) times daily.     [provider]  Insulin Glargine (LANTUS SOLOSTAR) 100 UNIT/ML Solostar Pen Inject 26 Units into the skin at bedtime.  03/31/15   [provider]  isosorbide  mononitrate (IMDUR) 30 MG 24 hr tablet Take 30 mg by mouth every morning.     [provider]  Liraglutide (VICTOZA) 18 MG/3ML SOPN Inject 1.8 mg into the skin every morning.     [provider]  magnesium oxide (MAG-OX) 400 MG tablet Take 400 mg by mouth every evening.     [provider]  meloxicam (MOBIC) 7.5 MG tablet Take 7.5 mg by mouth daily. 10/04/18   [provider]  metFORMIN (GLUCOPHAGE) 500 MG tablet Take 1,000 mg by mouth 2 (two) times daily with a meal.     [provider]  methocarbamol (ROBAXIN-750) 750 MG tablet Take 1 tablet (750 mg total) by mouth 3 (three) times daily as needed for muscle spasms. 02/10/16   Costella, Vista Mink, PA-C  nitroGLYCERIN (NITROSTAT) 0.4 MG SL tablet Place 0.4 mg under the tongue every 5 (five) minutes as needed for chest pain.    [provider]  omeprazole (PRILOSEC) 40 MG capsule Take 40 mg by mouth every morning.     [provider]  oxyCODONE-acetaminophen (PERCOCET) 7.5-325 MG tablet Take 1-2 tablets by mouth as needed for pain. 02/10/16   Costella, Vista Mink, PA-C  pioglitazone (ACTOS) 30 MG tablet Take 30 mg by mouth every morning.     [provider]  ramipril (ALTACE) 2.5 MG capsule Take 2.5 mg by mouth every morning.     [provider]  silodosin (RAPAFLO) 8 MG CAPS capsule Take 8 mg by mouth daily with breakfast.    [provider]  sulfamethoxazole-trimethoprim (BACTRIM DS) 800-160 MG tablet Take 1 tablet by mouth 2 (two) times daily. 12/12/18 12/19/18  [provider]  vitamin B-12 (CYANOCOBALAMIN) 1000 MCG tablet Take 1,000 mcg by mouth daily.    [provider]   Allergies  Allergen Reactions  . No Known Allergies     FAMILY HISTORY:  family history is not on file. SOCIAL HISTORY:  reports that he has never smoked. He has never used smokeless tobacco. He reports that he does not drink alcohol or use drugs.    Review of Systems:  Gen:  + fever HEENT: Denies blurred vision, double vision, ear pain, eye pain, hearing loss, nose bleeds, sore throat Cardiac:  No dizziness, chest pain or heaviness, chest tightness,edema, No JVD Resp:    No cough, -sputum production, -shortness of breath,-wheezing, -hemoptysis,  Gi: Denies swallowing difficulty, stomach pain,  Other:  All other systems negative     VITAL SIGNS: Temp:  [99.2 F (37.3 C)-103.1 F (39.5 C)] 103.1 F (39.5 C) (12/14 0815) Pulse Rate:  [93-149] 134 (12/14 0939) Resp:  [14-30] 27 (12/14 0939) BP: (110-170)/(58-110) 161/94 (12/14 0900) SpO2:  [87 %-100 %] 96 % (12/14 0939) Weight:  [133.8 kg-137.1 kg] 137.1 kg (12/14 0500)     SpO2: 96 % O2 Flow Rate (L/min): 2 L/min    Physical Examination:   GENERAL:critically ill HEAD: Normocephalic, atraumatic.  EYES: PERLA, EOMI No scleral icterus.  MOUTH: Moist mucosal membrane.  EAR, NOSE, THROAT: Clear without exudates. No external lesions.  NECK: Supple. No thyromegaly.  No JVD.  PULMONARY: CTA B/L + rhonchi,  CARDIOVASCULAR: S1 and S2. Regular rate and rhythm. No murmurs GASTROINTESTINAL: Soft, nontender, +distended. Positive bowel sounds.  MUSCULOSKELETAL: +edema NEUROLOGIC: No gross focal neurological deficits. 5/5 strength all extremities SKIN: No ulceration, lesions, rashes, or cyanosis.  PSYCHIATRIC: poor Insight, ALL OTHER ROS ARE NEGATIVE       MEDICATIONS: I have reviewed all  medications and confirmed regimen as documented    CULTURE RESULTS   Recent Results (from the past 240 hour(s))  Blood Culture (routine x 2)     Status: None (Preliminary result)   Collection Time: 12/06/2018  9:20 PM   Specimen: BLOOD  Result Value Ref Range Status   Specimen Description BLOOD RIGHT HAND  Final   Special Requests   Final    BOTTLES DRAWN AEROBIC AND ANAEROBIC Blood Culture adequate volume   Culture   Final    NO GROWTH < 12 HOURS Performed at [facility], [address], [place], [place] [address]    Report Status PENDING  Incomplete  Blood Culture (routine x 2)     Status: None (Preliminary result)   Collection Time: 12/31/2018  9:29 PM   Specimen: BLOOD  Result Value Ref  Range Status   Specimen Description BLOOD RIGHT ANTECUBITAL  Final   Special Requests   Final    BOTTLES DRAWN AEROBIC AND ANAEROBIC Blood Culture adequate volume   Culture   Final    NO GROWTH < 12 HOURS Performed at [facility], [address] [place], [place] [address]    Report Status PENDING  Incomplete  Respiratory Panel by RT PCR (Flu A&B, Covid) -     Status: None   Collection Time: 01/02/2019 11:41 PM  Result Value Ref Range Status   SARS Coronavirus 2 by RT PCR NEGATIVE NEGATIVE Final    Comment: (NOTE) SARS-CoV-2 target nucleic acids are NOT DETECTED. The SARS-CoV-2 RNA is generally detectable in upper respiratoy specimens during the acute phase of infection. The lowest concentration of SARS-CoV-2 viral copies this assay can detect is 131 copies/mL. A negative result does not preclude SARS-Cov-2 infection and should not be used as the sole basis for treatment or other patient management decisions. A negative result may occur with  improper specimen collection/handling, submission of specimen other than nasopharyngeal swab, presence of viral mutation(s) within the areas targeted by this assay, and inadequate number of viral copies (<131 copies/mL). A negative result must be combined with clinical observations, patient history, and epidemiological information. The expected result is Negative. Fact Sheet for Patients:  PinkCheek.be Fact Sheet for Healthcare Providers:  GravelBags.it This test is not yet ap proved or cleared by the [place] FDA and  has been authorized for detection and/or diagnosis of SARS-CoV-2 by FDA under an Emergency Use Authorization (EUA). This EUA will remain  in effect (meaning this test can be used) for the duration of the COVID-19 declaration under Section 564(b)(1) of the Act, 21 U.S.C. section 360bbb-3(b)(1), unless the authorization is terminated or revoked sooner.     Influenza A by PCR NEGATIVE NEGATIVE Final   Influenza B by PCR NEGATIVE NEGATIVE Final    Comment: (NOTE) The Xpert Xpress SARS-CoV-2/FLU/RSV assay is intended as an aid in  the diagnosis of influenza from Nasopharyngeal swab specimens and  should not be used as a sole basis for treatment. Nasal washings and  aspirates are unacceptable for Xpert Xpress SARS-CoV-2/FLU/RSV  testing. Fact Sheet for Patients: PinkCheek.be Fact Sheet for Healthcare Providers: GravelBags.it This test is not yet approved or cleared by the [place] FDA and  has been authorized for detection and/or diagnosis of SARS-CoV-2 by  FDA under an Emergency Use Authorization (EUA). This EUA will remain  in effect (meaning this test can be used) for the duration of the  Covid-19 declaration under Section 564(b)(1) of the Act, 21  U.S.C. section 360bbb-3(b)(1),  unless the authorization is  terminated or revoked. Performed at [facility], [address] [place], [place] [address]           IMAGING    DG Chest Port 1 View  Result Date: 12/28/2018 CLINICAL DATA:  Initial evaluation for acute fever, cough, congestion. EXAM: PORTABLE CHEST 1 VIEW COMPARISON:  Prior CT from 06/20/2017 FINDINGS: Mild cardiomegaly. Mediastinal silhouette within normal limits. Lungs hypoinflated. Mild diffuse interstitial prominence, which could reflect interstitial congestion and/or atypical pneumonitis. No consolidative opacity identified. No frank alveolar edema. No pleural effusion. No pneumothorax. No acute osseous finding. IMPRESSION: 1. Mild diffuse interstitial prominence, which could reflect interstitial congestion and/or atypical pneumonitis. No consolidative opacity identified. 2. Mild cardiomegaly without frank edema. Electronically Signed   By: Jeannine Boga M.D.   On: 12/22/2018 21:50    BMP Latest Ref Rng & Units 12/18/2018 01/01/2019 02/04/2016    Glucose 70 - 99 mg/dL 169(H) 103(H) 122(H)  BUN 8 - 23 mg/dL 39(H) 46(H) 26(H)  Creatinine 0.61 - 1.24 mg/dL 2.35(H) 2.55(H) 1.81(H)  Sodium 135 - 145 mmol/L 135 137 137  Potassium 3.5 - 5.1 mmol/L 5.3(H) 4.9 4.6  Chloride 98 - 111 mmol/L 103 103 106  CO2 22 - 32 mmol/L 18(L) 21(L) 24  Calcium 8.9 - 10.3 mg/dL 8.7(L) 8.9 9.6    CBC    Component Value Date/Time   WBC 8.3 12/18/2018 0848   RBC 3.57 (L) 12/18/2018 0848   HGB 10.9 (L) 12/18/2018 0848   HCT 33.1 (L) 12/18/2018 0848   PLT 141 (L) 12/18/2018 0848   PLT 250 08/27/2011 0633   MCV 92.7 12/18/2018 0848   MCH 30.5 12/18/2018 0848   MCHC 32.9 12/18/2018 0848   RDW 21.0 (H) 12/18/2018 0848   LYMPHSABS 0.3 (L) 12/21/2018 2129   MONOABS 0.4 12/30/2018 2129   EOSABS 0.2 12/16/2018 2129   BASOSABS 0.0 12/16/2018 2129      ASSESSMENT AND PLAN SYNOPSIS  SEVERE SEPSIS WITH PNEUMONIA WITH SEVERE LACTIC ACIDOS WITH  ACUE RENAL FAILURE  IV ABX HIGH FLOW [place] OXYGEN AS NEEDED SEPSIS PROTOCOL PLACE FOLEY  ELECTROLYTES -follow labs as needed -replace as needed -pharmacy consultation and following  AFIB WITH RVR AMIODARONE INFUSION   DVT/GI PRX ordered TRANSFUSIONS AS NEEDED MONITOR FSBS ASSESS the need for LABS as needed  HIGH RISK FOR INTUBATION AND CARDIAC ARREST   Critical Care Time devoted to patient care services described in this note is 35 minutes.   Overall, patient is critically ill, prognosis is guarded.  Patient with Multiorgan failure and at high risk for cardiac arrest and death.    Corrin Parker, M.D.  [facility]  Medical Director [place] Director [facility] Cardio-Pulmonary Department

## 2018-12-18 NOTE — ED Notes (Signed)
ED TO INPATIENT HANDOFF REPORT  ED Nurse Name and Phone #: jen  S Name/Age/Gender Carlos Obrien 84 y.o. male Room/Bed: ED02A/ED02A  Code Status   Code Status: Full Code  Home/SNF/Other Home Patient oriented to: self, place, time and situation Is this baseline? Yes   Triage Complete: Triage complete  Chief Complaint Fever [R50.9]  Triage Note EMS originally called to pts home to assist pt after falling off the toilet. PT reports he was not on the floor long before EMS arrived and he remembers the fall. Pt denies syncope.  When EMS arrived pt was unable to bear weight without leaning to the left. Pt is able to [place] cane at baseline. PT grimacing when moved but denies specific pain at this time. Per wife, pt has had cough, congestion and fever of 101 x 2-3 day without a COVID test having been performed yet. Pts temp is 101.9 upon arrival.   EMS vitals:  98.4 F CBG 84 Afib hx of same' 95% RA.      Allergies Allergies  Allergen Reactions  . No Known Allergies     Level of Care/Admitting Diagnosis ED Disposition    ED Disposition Condition Comment   Admit  Hospital Area: [facility] [[id number]]  Level of Care: Med-Surg [16]  Covid Evaluation: Confirmed COVID Negative  Diagnosis: Fever [025852]  Admitting Physician: Neena Rhymes [[id number]]  Attending Physician: Adella Hare E [[id number]]  Estimated length of stay: past midnight tomorrow  Certification:: I certify this patient will need inpatient services for at least 2 midnights       B Medical/Surgery History Past Medical History:  Diagnosis Date  . Anemia    hx  . Bilateral lower extremity edema   . Bladder calculi   . BPH (benign prostatic hyperplasia)   . CKD (chronic kidney disease)   . Coronary artery disease    cardiologist-- dr Loney Laurence [facility])  lov note in care everywhere tab in epic  . DDD (degenerative disc disease), lumbar   . Diabetes mellitus without complication  ([place])   . Diastolic CHF, chronic ([place])   . Dyspnea    exersion  . Dysrhythmia    atrial fib  . GERD (gastroesophageal reflux disease)   . History of adenomatous polyp of colon    2012  . History of Barrett's esophagus   . History of basal cell carcinoma excision    Sept 2016  --scalp  . History of colon cancer    1999  adenocarcinoma polyp  . History of hiatal hernia   . History of kidney stones   . History of kidney stones   . History of MI (myocardial infarction)    1996  . Hyperlipidemia   . Hypertension   . PAF (paroxysmal atrial fibrillation) ([place])    dx 2015  . Prostate cancer ([place])   . Urethral stricture   . Urinary incontinence    Past Surgical History:  Procedure Laterality Date  . BLEPHAROPLASTY Bilateral Fall 2015  . CORONARY ANGIOPLASTY  May 1996  dr fath [facility] date verified w/ medical records)   per cardiologist note in epic (dr Loney Laurence)  2 vessel CAD-- occluded RCA and 60% LAD  . CYSTOSCOPY WITH HOLMIUM LASER LITHOTRIPSY N/A 01/08/2015   Procedure: CYSTOSCOPY WITH REMOVAL OF BLADDER STONE;  Surgeon: Rana Snare, MD;  Location: [facility];  Service: Urology;  Laterality: N/A;  . CYSTOSCOPY WITH RETROGRADE PYELOGRAM, URETEROSCOPY AND STENT PLACEMENT  Aug 2015  . CYSTOSCOPY  WITH URETHRAL DILATATION N/A 01/08/2015   Procedure: CYSTOSCOPY WITH URETHRAL DILATATION;  Surgeon: Rana Snare, MD;  Location: [facility];  Service: Urology;  Laterality: N/A;  . LUMBAR LAMINECTOMY/DECOMPRESSION MICRODISCECTOMY N/A 02/09/2016   Procedure: Lumbar four  to Sacral one Laminectomy for decompression;  Surgeon: Kevan Ny Ditty, MD;  Location: [place];  Service: Neurosurgery;  Laterality: N/A;  L4 to S1 Laminectomy for decompression  . PARTIAL KNEE ARTHROPLASTY Right 11-08-2012  . TRANSTHORACIC ECHOCARDIOGRAM  10-22-2013   mild concentric LVH,  grade 1 diastolic dysfunction,  ef 50-55%,  mild MR and TR,  trivial PR     A IV  Location/Drains/Wounds Patient Lines/Drains/Airways Status   Active Line/Drains/Airways    Name:   Placement date:   Placement time:   Site:   Days:   Peripheral IV 12/27/2018 Right Hand   12/18/2018    2200    Hand   1   Peripheral IV 12/25/2018 Left Antecubital   12/06/2018    2200    Antecubital   1   Closed System Drain 1 Midline Back Accordion (Hemovac) 10 Fr.   02/09/16    1416    Back   1043   Incision (Closed) 01/08/15 Penis   01/08/15    1040     1440   Incision (Closed) 02/09/16 Back   02/09/16    1427     1043          Intake/Output Last 24 hours No intake or output data in the 24 hours ending 12/18/18 0433  Labs/Imaging Results for orders placed or performed during the hospital encounter of 12/13/2018 (from the past 48 hour(s))  Lactic acid, plasma     Status: Abnormal   Collection Time: 12/09/2018  9:29 PM  Result Value Ref Range   Lactic Acid, Venous 4.6 (HH) 0.5 - 1.9 mmol/L    Comment: CRITICAL RESULT CALLED TO, READ BACK BY AND VERIFIED WITH JENN Irini Leet ON 12/27/2018 A [address] [facility] Performed at [facility], [address] [place], [place] [address]   Comprehensive metabolic panel     Status: Abnormal   Collection Time: 12/20/2018  9:29 PM  Result Value Ref Range   Sodium 137 135 - 145 mmol/L   Potassium 4.9 3.5 - 5.1 mmol/L   Chloride 103 98 - 111 mmol/L   CO2 21 (L) 22 - 32 mmol/L   Glucose, Bld 103 (H) 70 - 99 mg/dL   BUN 46 (H) 8 - 23 mg/dL   Creatinine, Ser 2.55 (H) 0.61 - 1.24 mg/dL   Calcium 8.9 8.9 - 10.3 mg/dL   Total Protein 6.6 6.5 - 8.1 g/dL   Albumin 3.5 3.5 - 5.0 g/dL   AST 47 (H) 15 - 41 U/L   ALT 25 0 - 44 U/L   Alkaline Phosphatase 76 38 - 126 U/L   Total Bilirubin 0.9 0.3 - 1.2 mg/dL   GFR calc non Af Amer 23 (L) >60 mL/min   GFR calc Af Amer 26 (L) >60 mL/min   Anion gap 13 5 - 15    Comment: Performed at [facility], [address] [place], [place] [address]  CBC WITH DIFFERENTIAL     Status: Abnormal   Collection Time: 12/09/2018   9:29 PM  Result Value Ref Range   WBC 4.5 4.0 - 10.5 K/uL   RBC 3.46 (L) 4.22 - 5.81 MIL/uL   Hemoglobin 10.3 (L) 13.0 - 17.0 g/dL   HCT 31.8 (L) 39.0 -  52.0 %   MCV 91.9 80.0 - 100.0 fL   MCH 29.8 26.0 - 34.0 pg   MCHC 32.4 30.0 - 36.0 g/dL   RDW 21.1 (H) 11.5 - 15.5 %   Platelets 120 (L) 150 - 400 K/uL    Comment: Immature Platelet Fraction may be clinically indicated, consider ordering this additional test [id number]    nRBC 0.0 0.0 - 0.2 %   Neutrophils Relative % 74 %   Neutro Abs 3.3 1.7 - 7.7 K/uL   Lymphocytes Relative 7 %   Lymphs Abs 0.3 (L) 0.7 - 4.0 K/uL   Monocytes Relative 8 %   Monocytes Absolute 0.4 0.1 - 1.0 K/uL   Eosinophils Relative 4 %   Eosinophils Absolute 0.2 0.0 - 0.5 K/uL   Basophils Relative 0 %   Basophils Absolute 0.0 0.0 - 0.1 K/uL   WBC Morphology INCREASED BANDS (>20% BANDS)    RBC Morphology MORPHOLOGY UNREMARKABLE    Smear Review MORPHOLOGY UNREMARKABLE    Immature Granulocytes 7 %   Abs Immature Granulocytes 0.30 (H) 0.00 - 0.07 K/uL   Polychromasia PRESENT     Comment: Performed at [facility], [address] [place], [place] [address]  APTT     Status: None   Collection Time: 12/06/2018  9:29 PM  Result Value Ref Range   aPTT 35 24 - 36 seconds    Comment: Performed at [facility], [address] [place], [place] [address]  Protime-INR     Status: Abnormal   Collection Time: 12/13/2018  9:29 PM  Result Value Ref Range   Prothrombin Time 19.5 (H) 11.4 - 15.2 seconds   INR 1.7 (H) 0.8 - 1.2    Comment: (NOTE) INR goal varies based on device and disease states. Performed at [facility], [address] [place], [place] [address]   Procalcitonin     Status: None   Collection Time: 12/11/2018  9:29 PM  Result Value Ref Range   Procalcitonin 0.11 ng/mL    Comment:        Interpretation: PCT (Procalcitonin) <= 0.5 ng/mL: Systemic infection (sepsis) is not likely. Local bacterial infection is  possible. (NOTE)       Sepsis PCT Algorithm           Lower Respiratory Tract                                      Infection PCT Algorithm    ----------------------------     ----------------------------         PCT < 0.25 ng/mL                PCT < 0.10 ng/mL         Strongly encourage             Strongly discourage   discontinuation of antibiotics    initiation of antibiotics    ----------------------------     -----------------------------       PCT 0.25 - 0.50 ng/mL            PCT 0.10 - 0.25 ng/mL               OR       >80% decrease in PCT            Discourage initiation of  antibiotics      Encourage discontinuation           of antibiotics    ----------------------------     -----------------------------         PCT >= 0.50 ng/mL              PCT 0.26 - 0.50 ng/mL               AND        <80% decrease in PCT             Encourage initiation of                                             antibiotics       Encourage continuation           of antibiotics    ----------------------------     -----------------------------        PCT >= 0.50 ng/mL                  PCT > 0.50 ng/mL               AND         increase in PCT                  Strongly encourage                                      initiation of antibiotics    Strongly encourage escalation           of antibiotics                                     -----------------------------                                           PCT <= 0.25 ng/mL                                                 OR                                        > 80% decrease in PCT                                     Discontinue / Do not initiate                                             antibiotics Performed at [facility], [address] [place], [place] [address]   Troponin I (High Sensitivity)     Status: None   Collection Time: 12/10/2018  9:29 PM  Result Value Ref Range   Troponin I  (High Sensitivity) 6 <18 ng/L    Comment: (NOTE) Elevated high sensitivity troponin I (hsTnI) values and significant  changes across serial measurements may suggest ACS but many other  chronic and acute conditions are known to elevate hsTnI results.  Refer to the "Links" section for chest pain algorithms and additional  guidance. Performed at [facility], [place], [place] [address]   POC SARS Coronavirus 2 Ag     Status: None   Collection Time: 12/23/2018 10:36 PM  Result Value Ref Range   SARS Coronavirus 2 Ag NEGATIVE NEGATIVE    Comment: (NOTE) SARS-CoV-2 antigen NOT DETECTED.  Negative results are presumptive.  Negative results do not preclude SARS-CoV-2 infection and should not be used as the sole basis for treatment or other patient management decisions, including infection  control decisions, particularly in the presence of clinical signs and  symptoms consistent with COVID-19, or in those who have been in contact with the virus.  Negative results must be combined with clinical observations, patient history, and epidemiological information. The expected result is Negative. Fact Sheet for Patients: PodPark.tn Fact Sheet for Healthcare Providers: GiftContent.is This test is not yet approved or cleared by the [place] FDA and  has been authorized for detection and/or diagnosis of SARS-CoV-2 by FDA under an Emergency Use Authorization (EUA).  This EUA will remain in effect (meaning this test can be used) for the duration of  the COVID-19 de claration under Section 564(b)(1) of the Act, 21 U.S.C. section 360bbb-3(b)(1), unless the authorization is terminated or revoked sooner.   Lactic acid, plasma     Status: Abnormal   Collection Time: 01/04/2019 11:41 PM  Result Value Ref Range   Lactic Acid, Venous 3.3 (HH) 0.5 - 1.9 mmol/L    Comment: CRITICAL VALUE NOTED. VALUE IS CONSISTENT WITH  PREVIOUSLY REPORTED/CALLED VALUE [facility] Performed at [facility], [place], [place] [address]   Troponin I (High Sensitivity)     Status: None   Collection Time: 12/15/2018 11:41 PM  Result Value Ref Range   Troponin I (High Sensitivity) 7 <18 ng/L    Comment: (NOTE) Elevated high sensitivity troponin I (hsTnI) values and significant  changes across serial measurements may suggest ACS but many other  chronic and acute conditions are known to elevate hsTnI results.  Refer to the "Links" section for chest pain algorithms and additional  guidance. Performed at [facility], [address] [place], [place] [address]   Respiratory Panel by RT PCR (Flu A&B, Covid) -     Status: None   Collection Time: 12/08/2018 11:41 PM  Result Value Ref Range   SARS Coronavirus 2 by RT PCR NEGATIVE NEGATIVE    Comment: (NOTE) SARS-CoV-2 target nucleic acids are NOT DETECTED. The SARS-CoV-2 RNA is generally detectable in upper respiratoy specimens during the acute phase of infection. The lowest concentration of SARS-CoV-2 viral copies this assay can detect is 131 copies/mL. A negative result does not preclude SARS-Cov-2 infection and should not be used as the sole basis for treatment or other patient management decisions. A negative result may occur with  improper specimen collection/handling, submission of specimen other than nasopharyngeal swab, presence of viral mutation(s) within the areas targeted by this assay, and inadequate number of viral copies (<131 copies/mL). A negative result must be combined with clinical observations, patient history, and epidemiological information. The expected result is Negative. Fact Sheet  for Patients:  PinkCheek.be Fact Sheet for Healthcare Providers:  GravelBags.it This test is not yet ap proved or cleared by the [place] FDA and  has been authorized for detection  and/or diagnosis of SARS-CoV-2 by FDA under an Emergency Use Authorization (EUA). This EUA will remain  in effect (meaning this test can be used) for the duration of the COVID-19 declaration under Section 564(b)(1) of the Act, 21 U.S.C. section 360bbb-3(b)(1), unless the authorization is terminated or revoked sooner.    Influenza A by PCR NEGATIVE NEGATIVE   Influenza B by PCR NEGATIVE NEGATIVE    Comment: (NOTE) The Xpert Xpress SARS-CoV-2/FLU/RSV assay is intended as an aid in  the diagnosis of influenza from Nasopharyngeal swab specimens and  should not be used as a sole basis for treatment. Nasal washings and  aspirates are unacceptable for Xpert Xpress SARS-CoV-2/FLU/RSV  testing. Fact Sheet for Patients: PinkCheek.be Fact Sheet for Healthcare Providers: GravelBags.it This test is not yet approved or cleared by the [place] FDA and  has been authorized for detection and/or diagnosis of SARS-CoV-2 by  FDA under an Emergency Use Authorization (EUA). This EUA will remain  in effect (meaning this test can be used) for the duration of the  Covid-19 declaration under Section 564(b)(1) of the Act, 21  U.S.C. section 360bbb-3(b)(1), unless the authorization is  terminated or revoked. Performed at [facility], [address] [place], [place] [address]    DG Chest [place] 1 View  Result Date: 12/14/2018 CLINICAL DATA:  Initial evaluation for acute fever, cough, congestion. EXAM: PORTABLE CHEST 1 VIEW COMPARISON:  Prior CT from 06/20/2017 FINDINGS: Mild cardiomegaly. Mediastinal silhouette within normal limits. Lungs hypoinflated. Mild diffuse interstitial prominence, which could reflect interstitial congestion and/or atypical pneumonitis. No consolidative opacity identified. No frank alveolar edema. No pleural effusion. No pneumothorax. No acute osseous finding. IMPRESSION: 1. Mild diffuse interstitial prominence,  which could reflect interstitial congestion and/or atypical pneumonitis. No consolidative opacity identified. 2. Mild cardiomegaly without frank edema. Electronically Signed   By: Jeannine Boga M.D.   On: 12/22/2018 21:50    Pending Labs Unresulted Labs (From admission, onward)    Start     Ordered   12/19/18 5732  Basic metabolic panel  Tomorrow morning,   STAT     12/18/18 0024   12/18/18 0024  Hemoglobin A1c  Add-on,   AD     12/18/18 0024   12/18/18 0024  Urinalysis, Routine w reflex microscopic  Once,   STAT     12/18/18 0024   12/19/2018 2131  Blood Culture (routine x 2)  BLOOD CULTURE X 2,   STAT     01/02/2019 2131   12/21/2018 2131  Urine culture  ONCE - STAT,   STAT     12/15/2018 2131          Vitals/Pain Today's Vitals   12/18/18 0300 12/18/18 0330 12/18/18 0345 12/18/18 0400  BP: 122/68 (!) 142/76  138/73  Pulse: (!) 102  (!) 107   Resp: 20 20 14 16   Temp:      TempSrc:      SpO2: 98%  100%   Weight:      PainSc:        Isolation Precautions No active isolations  Medications Medications  0.45 % sodium chloride infusion (has no administration in time range)  traMADol (ULTRAM) tablet 50 mg (has no administration in time range)  senna (SENOKOT) tablet 8.6 mg (has no administration in time range)  acetaminophen (TYLENOL) tablet 650 mg (has no administration in time range)  oseltamivir (TAMIFLU) capsule 75 mg (has no administration in time range)  insulin aspart (novoLOG) injection 0-20 Units (has no administration in time range)  aspirin EC tablet 81 mg (has no administration in time range)  oxyCODONE-acetaminophen (PERCOCET) 7.5-325 MG per tablet 1 tablet (has no administration in time range)  sulfamethoxazole-trimethoprim (BACTRIM DS) 800-160 MG per tablet 1 tablet (has no administration in time range)  atenolol (TENORMIN) tablet 50 mg (has no administration in time range)  atorvastatin (LIPITOR) tablet 20 mg (has no administration in time range)   furosemide (LASIX) tablet 40 mg (has no administration in time range)  isosorbide mononitrate (IMDUR) 24 hr tablet 30 mg (has no administration in time range)  nitroGLYCERIN (NITROSTAT) SL tablet 0.4 mg (has no administration in time range)  Insulin Glargine (LANTUS) Solostar Pen 26 Units ( Subcutaneous Canceled Entry 12/18/18 0035)  magnesium oxide (MAG-OX) tablet 400 mg (has no administration in time range)  pantoprazole (PROTONIX) EC tablet 40 mg (has no administration in time range)  tamsulosin (FLOMAX) capsule 0.4 mg (has no administration in time range)  apixaban (ELIQUIS) tablet 5 mg (has no administration in time range)  gabapentin (NEURONTIN) capsule 100 mg (has no administration in time range)  methocarbamol (ROBAXIN) tablet 750 mg (has no administration in time range)  metroNIDAZOLE (FLAGYL) IVPB 500 mg (0 mg Intravenous Stopped 12/05/2018 2340)  vancomycin (VANCOCIN) IVPB 1000 mg/200 mL premix (0 mg Intravenous Stopped 12/18/18 0036)  acetaminophen (TYLENOL) tablet 1,000 mg (1,000 mg Oral Given 01/02/2019 2200)  lactated ringers bolus 1,000 mL (0 mLs Intravenous Stopped 12/16/2018 2340)  lactated ringers bolus 1,000 mL (0 mLs Intravenous Stopped 12/24/2018 2340)  ceFEPIme (MAXIPIME) 2 g in sodium chloride 0.9 % 100 mL IVPB (0 g Intravenous Stopped 12/20/2018 2340)  vancomycin (VANCOCIN) 1,500 mg in sodium chloride 0.9 % 500 mL IVPB (0 mg Intravenous Stopped 12/18/18 0416)    Mobility walks with device Moderate fall risk   Focused Assessments Renal Assessment Handoff:          R Recommendations: See Admitting Provider Note  Report given to:   Additional Notes:

## 2018-12-19 ENCOUNTER — Inpatient Hospital Stay: Payer: Medicare Other

## 2018-12-19 DIAGNOSIS — J9601 Acute respiratory failure with hypoxia: Secondary | ICD-10-CM

## 2018-12-19 DIAGNOSIS — J189 Pneumonia, unspecified organism: Secondary | ICD-10-CM

## 2018-12-19 LAB — BASIC METABOLIC PANEL
Anion gap: 11 (ref 5–15)
BUN: 55 mg/dL — ABNORMAL HIGH (ref 8–23)
CO2: 21 mmol/L — ABNORMAL LOW (ref 22–32)
Calcium: 8.4 mg/dL — ABNORMAL LOW (ref 8.9–10.3)
Chloride: 105 mmol/L (ref 98–111)
Creatinine, Ser: 2.98 mg/dL — ABNORMAL HIGH (ref 0.61–1.24)
GFR calc Af Amer: 22 mL/min — ABNORMAL LOW (ref >60)
GFR calc non Af Amer: 19 mL/min — ABNORMAL LOW (ref >60)
Glucose, Bld: 177 mg/dL — ABNORMAL HIGH (ref 70–99)
Potassium: 4.9 mmol/L (ref 3.5–5.1)
Sodium: 137 mmol/L (ref 135–145)

## 2018-12-19 LAB — BLOOD GAS, ARTERIAL
Acid-base deficit: 6.8 mmol/L — ABNORMAL HIGH (ref 0.0–2.0)
Bicarbonate: 19.7 mmol/L — ABNORMAL LOW (ref 20.0–28.0)
FIO2: 1
MECHVT: 550 mL
O2 Saturation: 99.8 %
PEEP: 10 cmH2O
Patient temperature: 37
RATE: 16 resp/min
pCO2 arterial: 42 mmHg (ref 32.0–48.0)
pH, Arterial: 7.28 — ABNORMAL LOW (ref 7.350–7.450)
pO2, Arterial: 241 mmHg — ABNORMAL HIGH (ref 83.0–108.0)

## 2018-12-19 LAB — URINE CULTURE: Culture: NO GROWTH

## 2018-12-19 LAB — CBC
HCT: 31.2 % — ABNORMAL LOW (ref 39.0–52.0)
Hemoglobin: 9.8 g/dL — ABNORMAL LOW (ref 13.0–17.0)
MCH: 30.5 pg (ref 26.0–34.0)
MCHC: 31.4 g/dL (ref 30.0–36.0)
MCV: 97.2 fL (ref 80.0–100.0)
Platelets: 122 10*3/uL — ABNORMAL LOW (ref 150–400)
RBC: 3.21 MIL/uL — ABNORMAL LOW (ref 4.22–5.81)
RDW: 21.4 % — ABNORMAL HIGH (ref 11.5–15.5)
WBC: 8.7 10*3/uL (ref 4.0–10.5)
nRBC: 0 % (ref 0.0–0.2)

## 2018-12-19 LAB — GLUCOSE, CAPILLARY
Glucose-Capillary: 138 mg/dL — ABNORMAL HIGH (ref 70–99)
Glucose-Capillary: 237 mg/dL — ABNORMAL HIGH (ref 70–99)
Glucose-Capillary: 241 mg/dL — ABNORMAL HIGH (ref 70–99)
Glucose-Capillary: 257 mg/dL — ABNORMAL HIGH (ref 70–99)
Glucose-Capillary: 262 mg/dL — ABNORMAL HIGH (ref 70–99)

## 2018-12-19 LAB — PROTIME-INR
INR: 1.8 — ABNORMAL HIGH (ref 0.8–1.2)
Prothrombin Time: 21 seconds — ABNORMAL HIGH (ref 11.4–15.2)

## 2018-12-19 LAB — PROTEIN / CREATININE RATIO, URINE
Creatinine, Urine: 118 mg/dL
Protein Creatinine Ratio: 0.46 mg/mg{Cre} — ABNORMAL HIGH (ref 0.00–0.15)
Total Protein, Urine: 54 mg/dL

## 2018-12-19 LAB — LACTIC ACID, PLASMA: Lactic Acid, Venous: 4.3 mmol/L (ref 0.5–1.9)

## 2018-12-19 LAB — TROPONIN I (HIGH SENSITIVITY)
Troponin I (High Sensitivity): 594 ng/L (ref <18)
Troponin I (High Sensitivity): 727 ng/L (ref <18)

## 2018-12-19 LAB — APTT: aPTT: >160 seconds (ref 24–36)

## 2018-12-19 LAB — PHOSPHORUS: Phosphorus: 4.7 mg/dL — ABNORMAL HIGH (ref 2.5–4.6)

## 2018-12-19 LAB — MAGNESIUM: Magnesium: 1.9 mg/dL (ref 1.7–2.4)

## 2018-12-19 MED ORDER — RACEPINEPHRINE HCL 2.25 % IN NEBU
INHALATION_SOLUTION | RESPIRATORY_TRACT | Status: AC
  Filled 2018-12-19: qty 0.5

## 2018-12-19 MED ORDER — PANTOPRAZOLE SODIUM 40 MG PO PACK
40.0000 mg | PACK | Freq: Every day | ORAL | Status: DC
  Administered 2018-12-19 – 2018-12-25 (×7): 40 mg
  Filled 2018-12-19 (×7): qty 20

## 2018-12-19 MED ORDER — FUROSEMIDE 10 MG/ML IJ SOLN
20.0000 mg | Freq: Once | INTRAMUSCULAR | Status: AC
  Administered 2018-12-19: 20 mg via INTRAVENOUS

## 2018-12-19 MED ORDER — ASPIRIN 81 MG PO CHEW
81.0000 mg | CHEWABLE_TABLET | Freq: Every day | ORAL | Status: DC
  Administered 2018-12-19 – 2018-12-25 (×7): 81 mg
  Filled 2018-12-19 (×8): qty 1

## 2018-12-19 MED ORDER — NOREPINEPHRINE 16 MG/250ML-% IV SOLN
0.0000 ug/min | INTRAVENOUS | Status: DC
  Administered 2018-12-19: 5 ug/min via INTRAVENOUS
  Administered 2018-12-21: 8 ug/min via INTRAVENOUS
  Administered 2018-12-22: 10 ug/min via INTRAVENOUS
  Administered 2018-12-25: 09:00:00 15 ug/min via INTRAVENOUS
  Administered 2018-12-25 – 2018-12-26 (×2): 40 ug/min via INTRAVENOUS
  Filled 2018-12-19 (×8): qty 250

## 2018-12-19 MED ORDER — VITAL HIGH PROTEIN PO LIQD
1000.0000 mL | ORAL | Status: DC
  Administered 2018-12-19 – 2018-12-25 (×7): 1000 mL

## 2018-12-19 MED ORDER — RACEPINEPHRINE HCL 2.25 % IN NEBU: 0.5000 mL | INHALATION_SOLUTION | Freq: Once | RESPIRATORY_TRACT | Status: DC

## 2018-12-19 MED ORDER — FENTANYL CITRATE (PF) 100 MCG/2ML IJ SOLN
INTRAMUSCULAR | Status: AC
  Filled 2018-12-19: qty 2

## 2018-12-19 MED ORDER — FENTANYL 2500MCG IN NS 250ML (10MCG/ML) PREMIX INFUSION
INTRAVENOUS | Status: AC
  Filled 2018-12-19: qty 250

## 2018-12-19 MED ORDER — VECURONIUM BROMIDE 10 MG IV SOLR
10.0000 mg | INTRAVENOUS | Status: DC | PRN
  Administered 2018-12-20: 10 mg via INTRAVENOUS
  Filled 2018-12-19: qty 10

## 2018-12-19 MED ORDER — VECURONIUM BROMIDE 10 MG IV SOLR: 10.0000 mg | Freq: Once | INTRAVENOUS | Status: AC

## 2018-12-19 MED ORDER — METHYLPREDNISOLONE SODIUM SUCC 125 MG IJ SOLR
INTRAMUSCULAR | Status: AC
  Administered 2018-12-19: 125 mg
  Filled 2018-12-19: qty 2

## 2018-12-19 MED ORDER — INSULIN ASPART 100 UNIT/ML ~~LOC~~ SOLN
0.0000 [IU] | SUBCUTANEOUS | Status: DC
  Administered 2018-12-19: 3 [IU] via SUBCUTANEOUS
  Administered 2018-12-19 (×2): 11 [IU] via SUBCUTANEOUS
  Administered 2018-12-19: 7 [IU] via SUBCUTANEOUS
  Administered 2018-12-20: 20:00:00 4 [IU] via SUBCUTANEOUS
  Administered 2018-12-20: 7 [IU] via SUBCUTANEOUS
  Administered 2018-12-20: 4 [IU] via SUBCUTANEOUS
  Administered 2018-12-20: 7 [IU] via SUBCUTANEOUS
  Administered 2018-12-20: 4 [IU] via SUBCUTANEOUS
  Administered 2018-12-21: 11 [IU] via SUBCUTANEOUS
  Administered 2018-12-21: 7 [IU] via SUBCUTANEOUS
  Administered 2018-12-21: 4 [IU] via SUBCUTANEOUS
  Administered 2018-12-21: 11 [IU] via SUBCUTANEOUS
  Administered 2018-12-21 (×2): 7 [IU] via SUBCUTANEOUS
  Administered 2018-12-22 (×2): 11 [IU] via SUBCUTANEOUS
  Administered 2018-12-22 (×2): 7 [IU] via SUBCUTANEOUS
  Administered 2018-12-22: 11 [IU] via SUBCUTANEOUS
  Administered 2018-12-22 – 2018-12-23 (×4): 7 [IU] via SUBCUTANEOUS
  Administered 2018-12-23: 08:00:00 4 [IU] via SUBCUTANEOUS
  Administered 2018-12-23 (×2): 7 [IU] via SUBCUTANEOUS
  Administered 2018-12-23 – 2018-12-24 (×2): 4 [IU] via SUBCUTANEOUS
  Administered 2018-12-24 (×2): 7 [IU] via SUBCUTANEOUS
  Administered 2018-12-24: 4 [IU] via SUBCUTANEOUS
  Administered 2018-12-24: 7 [IU] via SUBCUTANEOUS
  Administered 2018-12-24 – 2018-12-25 (×2): 4 [IU] via SUBCUTANEOUS
  Administered 2018-12-25: 3 [IU] via SUBCUTANEOUS
  Filled 2018-12-19 (×37): qty 1

## 2018-12-19 MED ORDER — ETOMIDATE 2 MG/ML IV SOLN
INTRAVENOUS | Status: AC
  Filled 2018-12-19: qty 10

## 2018-12-19 MED ORDER — IPRATROPIUM-ALBUTEROL 0.5-2.5 (3) MG/3ML IN SOLN: 3.0000 mL | Freq: Four times a day (QID) | RESPIRATORY_TRACT | Status: DC | PRN

## 2018-12-19 MED ORDER — PROCHLORPERAZINE EDISYLATE 10 MG/2ML IJ SOLN
10.0000 mg | Freq: Once | INTRAMUSCULAR | Status: AC
  Administered 2018-12-19: 10 mg via INTRAVENOUS
  Filled 2018-12-19: qty 2

## 2018-12-19 MED ORDER — SODIUM CHLORIDE 0.9 % IV SOLN
0.0000 ug/min | INTRAVENOUS | Status: DC
  Administered 2018-12-19: 145 ug/min via INTRAVENOUS
  Administered 2018-12-19: 15:00:00 115 ug/min via INTRAVENOUS
  Administered 2018-12-19: 135 ug/min via INTRAVENOUS
  Administered 2018-12-19: 90 ug/min via INTRAVENOUS
  Administered 2018-12-19: 115 ug/min via INTRAVENOUS
  Administered 2018-12-19: 16:00:00 125 ug/min via INTRAVENOUS
  Administered 2018-12-19: 145 ug/min via INTRAVENOUS
  Administered 2018-12-19: 105 ug/min via INTRAVENOUS
  Administered 2018-12-20: 60 ug/min via INTRAVENOUS
  Administered 2018-12-20: 50 ug/min via INTRAVENOUS
  Filled 2018-12-19 (×2): qty 10
  Filled 2018-12-19: qty 1
  Filled 2018-12-19 (×3): qty 10
  Filled 2018-12-19 (×4): qty 1
  Filled 2018-12-19: qty 10
  Filled 2018-12-19 (×2): qty 1
  Filled 2018-12-19: qty 10

## 2018-12-19 MED ORDER — PHENYLEPHRINE HCL-NACL 10-0.9 MG/250ML-% IV SOLN
0.0000 ug/min | INTRAVENOUS | Status: DC
  Filled 2018-12-19: qty 250

## 2018-12-19 MED ORDER — FENTANYL 2500MCG IN NS 250ML (10MCG/ML) PREMIX INFUSION
0.0000 ug/h | INTRAVENOUS | Status: DC
  Administered 2018-12-19: 50 ug/h via INTRAVENOUS
  Administered 2018-12-19: 14:00:00 400 ug/h via INTRAVENOUS
  Administered 2018-12-19: 20:00:00 375 ug/h via INTRAVENOUS
  Administered 2018-12-20: 12:00:00 300 ug/h via INTRAVENOUS
  Administered 2018-12-20 (×2): 325 ug/h via INTRAVENOUS
  Administered 2018-12-21: 13:00:00 250 ug/h via INTRAVENOUS
  Administered 2018-12-21: 300 ug/h via INTRAVENOUS
  Administered 2018-12-22: 200 ug/h via INTRAVENOUS
  Administered 2018-12-23: 100 ug/h via INTRAVENOUS
  Administered 2018-12-24: 125 ug/h via INTRAVENOUS
  Administered 2018-12-25: 100 ug/h via INTRAVENOUS
  Filled 2018-12-19 (×11): qty 250

## 2018-12-19 MED ORDER — PRO-STAT SUGAR FREE PO LIQD
60.0000 mL | Freq: Three times a day (TID) | ORAL | Status: DC
  Administered 2018-12-19 – 2018-12-25 (×18): 60 mL

## 2018-12-19 MED ORDER — LORAZEPAM 2 MG/ML IJ SOLN
INTRAMUSCULAR | Status: AC
  Administered 2018-12-19: 09:00:00 4 mg
  Filled 2018-12-19: qty 2

## 2018-12-19 MED ORDER — LORAZEPAM 2 MG/ML IJ SOLN
2.0000 mg | INTRAMUSCULAR | Status: DC | PRN
  Administered 2018-12-19 – 2018-12-26 (×9): 2 mg via INTRAVENOUS
  Filled 2018-12-19 (×12): qty 1

## 2018-12-19 MED ORDER — ADULT MULTIVITAMIN LIQUID CH
15.0000 mL | Freq: Every day | ORAL | Status: DC
  Administered 2018-12-20 – 2018-12-25 (×6): 15 mL
  Filled 2018-12-19 (×7): qty 15

## 2018-12-19 MED ORDER — FUROSEMIDE 10 MG/ML IJ SOLN
INTRAMUSCULAR | Status: AC
  Administered 2018-12-19: 20 mg
  Filled 2018-12-19: qty 2

## 2018-12-19 MED ORDER — HEPARIN (PORCINE) 25000 UT/250ML-% IV SOLN
1500.0000 [IU]/h | INTRAVENOUS | Status: DC
  Administered 2018-12-19: 1600 [IU]/h via INTRAVENOUS
  Administered 2018-12-20: 1500 [IU]/h via INTRAVENOUS
  Filled 2018-12-19 (×2): qty 250

## 2018-12-19 MED ORDER — LORAZEPAM 2 MG/ML IJ SOLN: 4.0000 mg | Freq: Once | INTRAMUSCULAR | Status: AC

## 2018-12-19 MED ORDER — MAGNESIUM OXIDE 400 (241.3 MG) MG PO TABS: 400.0000 mg | ORAL_TABLET | Freq: Every evening | ORAL | Status: DC

## 2018-12-19 MED ORDER — ETOMIDATE 2 MG/ML IV SOLN
20.0000 mg | Freq: Once | INTRAVENOUS | Status: AC
  Administered 2018-12-19: 20 mg via INTRAVENOUS

## 2018-12-19 MED ORDER — MAGNESIUM SULFATE IN D5W 1-5 GM/100ML-% IV SOLN
1.0000 g | Freq: Once | INTRAVENOUS | Status: AC
  Administered 2018-12-19: 1 g via INTRAVENOUS
  Filled 2018-12-19: qty 100

## 2018-12-19 MED ORDER — ROCURONIUM BROMIDE 50 MG/5ML IV SOLN
50.0000 mg | Freq: Once | INTRAVENOUS | Status: AC
  Administered 2018-12-19: 50 mg via INTRAVENOUS

## 2018-12-19 MED ORDER — ROCURONIUM BROMIDE 50 MG/5ML IV SOLN
INTRAVENOUS | Status: AC
  Filled 2018-12-19: qty 1

## 2018-12-19 MED ORDER — VECURONIUM BROMIDE 10 MG IV SOLR
INTRAVENOUS | Status: AC
  Administered 2018-12-19: 10 mg
  Filled 2018-12-19: qty 10

## 2018-12-19 MED ORDER — RACEPINEPHRINE HCL 2.25 % IN NEBU
0.5000 mL | INHALATION_SOLUTION | Freq: Once | RESPIRATORY_TRACT | Status: AC
  Administered 2018-12-19: 0.5 mL via RESPIRATORY_TRACT

## 2018-12-19 MED ORDER — FENTANYL CITRATE (PF) 100 MCG/2ML IJ SOLN
100.0000 ug | Freq: Once | INTRAMUSCULAR | Status: AC
  Administered 2018-12-19: 100 ug via INTRAVENOUS

## 2018-12-19 MED ORDER — METHYLPREDNISOLONE SODIUM SUCC 125 MG IJ SOLR
60.0000 mg | INTRAMUSCULAR | Status: AC
  Administered 2018-12-19: 60 mg via INTRAVENOUS

## 2018-12-19 MED ORDER — VITAMIN K1 10 MG/ML IJ SOLN
10.0000 mg | Freq: Once | INTRAVENOUS | Status: AC
  Administered 2018-12-19: 15:00:00 10 mg via INTRAVENOUS
  Filled 2018-12-19: qty 1

## 2018-12-19 NOTE — Progress Notes (Signed)
Dr. Mortimer Fries ordered coude catheter placement for patient post intubation. Attempted to place coude. Unable to advance due to resistance. When withdrew catheter blood on foley tube. Patient had slight void after insertion attempt while attempted to replace condom catheter.

## 2018-12-19 NOTE — Progress Notes (Signed)
OT Cancellation Note  Patient Details Name: Carlos Obrien MRN: [id number] DOB: January 13, 1934   Cancelled Treatment:    Reason Eval/Treat Not Completed: Patient not medically ready. Consult received, chart reviewed. Pt with increased respiratory difficulty with emergent intubation this AM and on ventilator. Will cancel current OT orders. Please re-order when medically appropriate.  Jeni Salles, MPH, MS, OTR/L ascom [phone] 12/19/18, 9:54 AM

## 2018-12-19 NOTE — Progress Notes (Signed)
Name: Carlos Obrien MRN: [id number] DOB: 1934-09-05      CONSULTATION DATE: 12/19/2018  REFERRING MD : Corliss Parish 84 y.o. male with medical history significant of DM with hyperlipidemia,a. Fib, h/o CHF, h/o prostate cancer, h/o CAD. He reports that for 1-2 days he has been feeling sick with weakness, diffuse myalgias and decreased PO intake. His wife is at home and has been diagnosed with pneumonia. He reports he has been taking his temperature at home and reports it has been normal. He has been treated for UTI 2/2 urinary frequency. Because of progressive weakness he presents to [facility] for evalaution.   ED Course: In the ED he is febrile to 101.6 F, tachycardic to 120-130, mildly hypertensice. Code sepsis initiated: blood cultures drawn, abx with Vanco, cefipieme and Flagyl. IV fluid bolus administered. Labs were remarkable for Lactic acid 4.6, Cr 2.55, normal WBC 4.5 with a normal diff, normal procalcitonin.  CXR with mild interstial prominence w/o pul edema or frank infiltrate. Covid rapid Ag test negative, PCR covid testing NEG    CHIEF COMPLAINT: follow up resp failure HPI Increased WOB and resp distress On biPAP High risk for intubation Worsening renal failure High risk for cardiac arrest      PAST MEDICAL HISTORY :   has a past medical history of Anemia, Bilateral lower extremity edema, Bladder calculi, BPH (benign prostatic hyperplasia), CKD (chronic kidney disease), Coronary artery disease, DDD (degenerative disc disease), lumbar, Diabetes mellitus without complication ([place]), Diastolic CHF, chronic (HCC), Dyspnea, Dysrhythmia, GERD (gastroesophageal reflux disease), History of adenomatous polyp of colon, History of Barrett's esophagus, History of basal cell carcinoma excision, History of colon cancer, History of hiatal hernia, History of kidney stones, History of kidney stones, History of MI (myocardial infarction), Hyperlipidemia, Hypertension, PAF (paroxysmal atrial  fibrillation) ([place]), Prostate cancer ([place]), Urethral stricture, and Urinary incontinence.  has a past surgical history that includes Partial knee arthroplasty (Right, 11-08-2012); Cystoscopy with retrograde pyelogram, ureteroscopy and stent placement (Aug 2015); transthoracic echocardiogram (10-22-2013); Blepharoplasty (Bilateral, Fall 2015); Cystoscopy with holmium laser lithotripsy (N/A, 01/08/2015); Cystoscopy with urethral dilatation (N/A, 01/08/2015); Coronary angioplasty (May 1996  dr fath [facility] date verified w/ medical records)); and Lumbar laminectomy/decompression microdiscectomy (N/A, 02/09/2016). Prior to Admission medications   Medication Sig Start Date End Date Taking? Authorizing Provider  apixaban (ELIQUIS) 5 MG TABS tablet Take 1 tablet (5 mg total) by mouth 2 (two) times daily. 02/13/16   Costella, Vista Mink, PA-C  aspirin 81 MG EC tablet Take 81 mg by mouth daily.     [provider]  atenolol (TENORMIN) 50 MG tablet Take 50 mg by mouth every morning.     [provider]  atorvastatin (LIPITOR) 20 MG tablet Take 20 mg by mouth every evening.     [provider]  Cholecalciferol (VITAMIN D) 2000 units tablet Take 2,000 Units by mouth daily.    [provider]  furosemide (LASIX) 20 MG tablet Take 40 mg by mouth every morning.     [provider]  gabapentin (NEURONTIN) 300 MG capsule Take 100 mg by mouth 3 (three) times daily.     [provider]  glipiZIDE (GLUCOTROL) 5 MG tablet Take 5 mg by mouth 2 (two) times daily.     [provider]  Insulin Glargine (LANTUS SOLOSTAR) 100 UNIT/ML Solostar Pen Inject 26 Units into the skin at bedtime.  03/31/15   [provider]  isosorbide mononitrate (IMDUR) 30 MG 24 hr tablet Take 30 mg by  mouth every morning.     [provider]  Liraglutide (VICTOZA) 18 MG/3ML SOPN Inject 1.8 mg into the skin every morning.     [provider]  magnesium oxide (MAG-OX) 400 MG  tablet Take 400 mg by mouth every evening.     [provider]  meloxicam (MOBIC) 7.5 MG tablet Take 7.5 mg by mouth daily. 10/04/18   [provider]  metFORMIN (GLUCOPHAGE) 500 MG tablet Take 1,000 mg by mouth 2 (two) times daily with a meal.     [provider]  methocarbamol (ROBAXIN-750) 750 MG tablet Take 1 tablet (750 mg total) by mouth 3 (three) times daily as needed for muscle spasms. 02/10/16   Costella, Vista Mink, PA-C  nitroGLYCERIN (NITROSTAT) 0.4 MG SL tablet Place 0.4 mg under the tongue every 5 (five) minutes as needed for chest pain.    [provider]  omeprazole (PRILOSEC) 40 MG capsule Take 40 mg by mouth every morning.     [provider]  oxyCODONE-acetaminophen (PERCOCET) 7.5-325 MG tablet Take 1-2 tablets by mouth as needed for pain. 02/10/16   Costella, Vista Mink, PA-C  pioglitazone (ACTOS) 30 MG tablet Take 30 mg by mouth every morning.     [provider]  ramipril (ALTACE) 2.5 MG capsule Take 2.5 mg by mouth every morning.     [provider]  silodosin (RAPAFLO) 8 MG CAPS capsule Take 8 mg by mouth daily with breakfast.    [provider]  sulfamethoxazole-trimethoprim (BACTRIM DS) 800-160 MG tablet Take 1 tablet by mouth 2 (two) times daily. 12/12/18 12/19/18  [provider]  vitamin B-12 (CYANOCOBALAMIN) 1000 MCG tablet Take 1,000 mcg by mouth daily.    [provider]   Allergies  Allergen Reactions  . No Known Allergies     FAMILY HISTORY:  family history is not on file. SOCIAL HISTORY:  reports that he has never smoked. He has never used smokeless tobacco. He reports that he does not drink alcohol or use drugs.    REVIEW OF SYSTEMS  PATIENT IS UNABLE TO PROVIDE COMPLETE REVIEW OF SYSTEM S DUE TO SEVERE CRITICAL ILLNESS AND ENCEPHALOPATHY    VITAL SIGNS: Temp:  [97.6 F (36.4 C)-103.1 F (39.5 C)] 97.6 F (36.4 C) (12/15 0000) Pulse Rate:  [67-149] 93 (12/15  0635) Resp:  [10-30] 27 (12/15 0700) BP: (74-170)/(47-143) 124/90 (12/15 0700) SpO2:  [87 %-100 %] 91 % (12/15 0647) FiO2 (%):  [35 %-80 %] 80 % (12/15 0647) Weight:  [135.5 kg] 135.5 kg (12/14 1000)     SpO2: 91 % O2 Flow Rate (L/min): 35 L/min FiO2 (%): 80 %    PHYSICAL EXAMINATION:  GENERAL:critically ill appearing, +resp distress HEAD: Normocephalic, atraumatic.  EYES: Pupils equal, round, reactive to light.  No scleral icterus.  MOUTH: Moist mucosal membrane. NECK: Supple. No thyromegaly. No nodules. No JVD.  PULMONARY: +rhonchi, +wheezing CARDIOVASCULAR: S1 and S2. Regular rate and rhythm. No murmurs, rubs, or gallops.  GASTROINTESTINAL: Soft, nontender, -distended. Positive bowel sounds.  MUSCULOSKELETAL: No swelling, clubbing, or edema.  NEUROLOGIC: lethargic SKIN:intact,warm,dry        MEDICATIONS: I have reviewed all medications and confirmed regimen as documented    CULTURE RESULTS   Recent Results (from the past 240 hour(s))  Blood Culture (routine x 2)     Status: None (Preliminary result)   Collection Time: 12/10/2018  9:20 PM   Specimen: BLOOD  Result Value Ref Range Status   Specimen Description BLOOD RIGHT  HAND  Final   Special Requests   Final    BOTTLES DRAWN AEROBIC AND ANAEROBIC Blood Culture adequate volume   Culture   Final    NO GROWTH 2 DAYS Performed at [facility], [address] [place], [place] [address]    Report Status PENDING  Incomplete  Blood Culture (routine x 2)     Status: None (Preliminary result)   Collection Time: 12/12/2018  9:29 PM   Specimen: BLOOD  Result Value Ref Range Status   Specimen Description BLOOD RIGHT ANTECUBITAL  Final   Special Requests   Final    BOTTLES DRAWN AEROBIC AND ANAEROBIC Blood Culture adequate volume   Culture   Final    NO GROWTH 2 DAYS Performed at [facility], [address], [place], [place] [address]    Report Status PENDING  Incomplete  Respiratory Panel by RT  PCR (Flu A&B, Covid) -     Status: None   Collection Time: 12/13/2018 11:41 PM  Result Value Ref Range Status   SARS Coronavirus 2 by RT PCR NEGATIVE NEGATIVE Final    Comment: (NOTE) SARS-CoV-2 target nucleic acids are NOT DETECTED. The SARS-CoV-2 RNA is generally detectable in upper respiratoy specimens during the acute phase of infection. The lowest concentration of SARS-CoV-2 viral copies this assay can detect is 131 copies/mL. A negative result does not preclude SARS-Cov-2 infection and should not be used as the sole basis for treatment or other patient management decisions. A negative result may occur with  improper specimen collection/handling, submission of specimen other than nasopharyngeal swab, presence of viral mutation(s) within the areas targeted by this assay, and inadequate number of viral copies (<131 copies/mL). A negative result must be combined with clinical observations, patient history, and epidemiological information. The expected result is Negative. Fact Sheet for Patients:  PinkCheek.be Fact Sheet for Healthcare Providers:  GravelBags.it This test is not yet ap proved or cleared by the [place] FDA and  has been authorized for detection and/or diagnosis of SARS-CoV-2 by FDA under an Emergency Use Authorization (EUA). This EUA will remain  in effect (meaning this test can be used) for the duration of the COVID-19 declaration under Section 564(b)(1) of the Act, 21 U.S.C. section 360bbb-3(b)(1), unless the authorization is terminated or revoked sooner.    Influenza A by PCR NEGATIVE NEGATIVE Final   Influenza B by PCR NEGATIVE NEGATIVE Final    Comment: (NOTE) The Xpert Xpress SARS-CoV-2/FLU/RSV assay is intended as an aid in  the diagnosis of influenza from Nasopharyngeal swab specimens and  should not be used as a sole basis for treatment. Nasal washings and  aspirates are unacceptable for Xpert  Xpress SARS-CoV-2/FLU/RSV  testing. Fact Sheet for Patients: PinkCheek.be Fact Sheet for Healthcare Providers: GravelBags.it This test is not yet approved or cleared by the [place] FDA and  has been authorized for detection and/or diagnosis of SARS-CoV-2 by  FDA under an Emergency Use Authorization (EUA). This EUA will remain  in effect (meaning this test can be used) for the duration of the  Covid-19 declaration under Section 564(b)(1) of the Act, 21  U.S.C. section 360bbb-3(b)(1), unless the authorization is  terminated or revoked. Performed at [facility], [address] [place],  [address]   CULTURE, BLOOD (ROUTINE X 2) w Reflex to ID Panel     Status: None (Preliminary result)   Collection Time: 12/18/18  8:40 AM   Specimen: BLOOD  Result Value Ref Range Status  Specimen Description BLOOD BLOOD RIGHT FOREARM  Final   Special Requests   Final    BOTTLES DRAWN AEROBIC AND ANAEROBIC Blood Culture results may not be optimal due to an inadequate volume of blood received in culture bottles   Culture   Final    NO GROWTH < 24 HOURS Performed at [facility], [address] [place], [place] [address]    Report Status PENDING  Incomplete  CULTURE, BLOOD (ROUTINE X 2) w Reflex to ID Panel     Status: None (Preliminary result)   Collection Time: 12/18/18  9:02 AM   Specimen: BLOOD  Result Value Ref Range Status   Specimen Description BLOOD BLOOD LEFT ARM  Final   Special Requests   Final    BOTTLES DRAWN AEROBIC AND ANAEROBIC Blood Culture adequate volume   Culture   Final    NO GROWTH < 24 HOURS Performed at [facility], [address] [place], [place] [address]    Report Status PENDING  Incomplete  MRSA PCR Screening     Status: None   Collection Time: 12/18/18  9:14 AM   Specimen: Nasal Mucosa; Nasopharyngeal  Result Value Ref Range Status   MRSA by PCR NEGATIVE NEGATIVE  Final    Comment:        The GeneXpert MRSA Assay (FDA approved for NASAL specimens only), is one component of a comprehensive MRSA colonization surveillance program. It is not intended to diagnose MRSA infection nor to guide or monitor treatment for MRSA infections. Performed at [facility], [address] [place], [place] [address]           IMAGING    DG Chest 1 View  Result Date: 12/19/2018 CLINICAL DATA:  Shortness of breath. EXAM: CHEST  1 VIEW COMPARISON:  Radiograph 01/03/2019 FINDINGS: Lower lung volumes from prior exam. Cardiomegaly is not significantly changed allowing for differences in technique. Bronchovascular crowding the similar interstitial prominence to prior. No confluent airspace disease. No large pleural effusion. No pneumothorax. Unchanged osseous structures. IMPRESSION: 1. Lower lung volumes from prior exam. Cardiomegaly is grossly stable. 2. Bronchovascular crowding secondary to lower lung volumes with grossly unchanged interstitial prominence. Electronically Signed   By: Keith Rake M.D.   On: 12/19/2018 05:12    BMP Latest Ref Rng & Units 12/19/2018 12/18/2018 12/18/2018  Glucose 70 - 99 mg/dL 177(H) 218(H) 209(H)  BUN 8 - 23 mg/dL 55(H) 52(H) 44(H)  Creatinine 0.61 - 1.24 mg/dL 2.98(H) 3.07(H) 2.64(H)  Sodium 135 - 145 mmol/L 137 133(L) 134(L)  Potassium 3.5 - 5.1 mmol/L 4.9 4.8 5.2(H)  Chloride 98 - 111 mmol/L 105 104 105  CO2 22 - 32 mmol/L 21(L) 18(L) 19(L)  Calcium 8.9 - 10.3 mg/dL 8.4(L) 8.1(L) 8.5(L)    CBC    Component Value Date/Time   WBC 8.7 12/19/2018 0422   RBC 3.21 (L) 12/19/2018 0422   HGB 9.8 (L) 12/19/2018 0422   HCT 31.2 (L) 12/19/2018 0422   PLT 122 (L) 12/19/2018 0422   PLT 250 08/27/2011 0633   MCV 97.2 12/19/2018 0422   MCH 30.5 12/19/2018 0422   MCHC 31.4 12/19/2018 0422   RDW 21.4 (H) 12/19/2018 0422   LYMPHSABS 0.3 (L) 12/29/2018 2129   MONOABS 0.4 12/21/2018 2129   EOSABS 0.2 12/28/2018 2129    BASOSABS 0.0 12/15/2018 2129      ASSESSMENT AND PLAN SYNOPSIS  SEVERE SEPSIS WITH PNEUMONIA WITH SEVERE LACTIC ACIDOS WITH  ACUE RENAL FAILURE, FINDINGS SUGGEST CARDIORENAL SYNDROME  Severe ACUTE Hypoxic and Hypercapnic Respiratory Failure High risk for intubation -continue Bronchodilator Therapy -Wean Fio2 and PEEP as tolerated   SEVERE SEPSIS continue IV ABX Follow up Cultures   CARDIORENAL SYNDROME ACUTE KIDNEY INJURY/Renal Failure -follow chem 7 -follow UO -continue Foley Catheter-assess need -Avoid nephrotoxic agents Will need HD    ELECTROLYTES -follow labs as needed -replace as needed -pharmacy consultation and following  AFIB WITH RVR Amiodarone infusion    DVT/GI PRX ordered TRANSFUSIONS AS NEEDED MONITOR FSBS ASSESS the need for LABS as needed     HIGH RISK FOR INTUBATION AND CARDIAC ARREST   Critical Care Time devoted to patient care services described in this note is 32 minutes.   Overall, patient is critically ill, prognosis is guarded.  Patient with Multiorgan failure and at high risk for cardiac arrest and death.    Corrin Parker, M.D.  [facility]  Medical Director Media Director [facility] Cardio-Pulmonary Department

## 2018-12-19 NOTE — Consult Note (Addendum)
Urology Consult   Chief Complaint: N/A  History of Present Illness: Carlos Obrien is a 84 y.o. critically ill male seen at the request of Dr. Mortimer Fries for inability to place a Foley catheter.  He was admitted 12/14 with a 1-2-day history of weakness, myalgias and decreased p.o. intake.  He was febrile to 101.6 in the ED and admitted severe sepsis felt most likely secondary to pneumonia.  He developed respiratory failure this morning requiring intubation.  He has a history of prostate cancer status post brachytherapy greater than 10 years ago.  He had a urethral stricture in 2017 and underwent balloon dilation by Dr. Risa Grill.  Attempts at Foley catheter placement by nursing staff were unsuccessful yesterday.  He has a condom catheter and output has been approximately 150 mL.  GFR has been 18-19.  Renal ultrasound performed earlier today was suboptimal due to body habitus but no gross hydronephrosis was noted.  Bladder volume was estimated at 398 cc.  Past Medical History:  Diagnosis Date  . Anemia    hx  . Bilateral lower extremity edema   . Bladder calculi   . BPH (benign prostatic hyperplasia)   . CKD (chronic kidney disease)   . Coronary artery disease    cardiologist-- dr Loney Laurence [facility])  lov note in care everywhere tab in epic  . DDD (degenerative disc disease), lumbar   . Diabetes mellitus without complication ([place])   . Diastolic CHF, chronic ([place])   . Dyspnea    exersion  . Dysrhythmia    atrial fib  . GERD (gastroesophageal reflux disease)   . History of adenomatous polyp of colon    2012  . History of Barrett's esophagus   . History of basal cell carcinoma excision    Sept 2016  --scalp  . History of colon cancer    1999  adenocarcinoma polyp  . History of hiatal hernia   . History of kidney stones   . History of kidney stones   . History of MI (myocardial infarction)    1996  . Hyperlipidemia   . Hypertension   . PAF (paroxysmal atrial  fibrillation) ([place])    dx 2015  . Prostate cancer ([place])   . Urethral stricture   . Urinary incontinence     Past Surgical History:  Procedure Laterality Date  . BLEPHAROPLASTY Bilateral Fall 2015  . CORONARY ANGIOPLASTY  May 1996  dr fath [facility] date verified w/ medical records)   per cardiologist note in epic (dr Loney Laurence)  2 vessel CAD-- occluded RCA and 60% LAD  . CYSTOSCOPY WITH HOLMIUM LASER LITHOTRIPSY N/A 01/08/2015   Procedure: CYSTOSCOPY WITH REMOVAL OF BLADDER STONE;  Surgeon: Rana Snare, MD;  Location: [facility];  Service: Urology;  Laterality: N/A;  . CYSTOSCOPY WITH RETROGRADE PYELOGRAM, URETEROSCOPY AND STENT PLACEMENT  Aug 2015  . CYSTOSCOPY WITH URETHRAL DILATATION N/A 01/08/2015   Procedure: CYSTOSCOPY WITH URETHRAL DILATATION;  Surgeon: Rana Snare, MD;  Location: [facility];  Service: Urology;  Laterality: N/A;  . LUMBAR LAMINECTOMY/DECOMPRESSION MICRODISCECTOMY N/A 02/09/2016   Procedure: Lumbar four  to Sacral one Laminectomy for decompression;  Surgeon: Kevan Ny Ditty, MD;  Location: [place];  Service: Neurosurgery;  Laterality: N/A;  L4 to S1 Laminectomy for decompression  . PARTIAL KNEE ARTHROPLASTY Right 11-08-2012  . TRANSTHORACIC ECHOCARDIOGRAM  10-22-2013   mild concentric LVH,  grade 1 diastolic dysfunction,  ef 50-55%,  mild MR and TR,  trivial PR  Home Medications:  No outpatient medications have been marked as taking for the 01/03/2019 encounter [facility] Encounter).    Allergies:  Allergies  Allergen Reactions  . No Known Allergies     History reviewed. No pertinent family history.  Social History:  reports that he has never smoked. He has never used smokeless tobacco. He reports that he does not drink alcohol or use drugs.  ROS: A complete review of systems was performed.  All systems are negative except for pertinent findings as noted.  Physical Exam:  Vital signs in last 24 hours: Temp:  [97.6 F (36.4  C)-101.4 F (38.6 C)] 100.4 F (38 C) (12/15 0800) Pulse Rate:  [67-112] 111 (12/15 1200) Resp:  [0-27] 6 (12/15 1200) BP: (74-167)/(47-143) 77/53 (12/15 1200) SpO2:  [91 %-100 %] 98 % (12/15 1200) FiO2 (%):  [35 %-100 %] 50 % (12/15 1131) Constitutional: Intubated, sedated GI: Abdomen is soft, nontender, nondistended, no abdominal masses GU: No CVA tenderness bladder not palpable or percussible   Laboratory Data:  Recent Labs    12/20/2018 2129 12/18/18 0848 12/19/18 0422  WBC 4.5 8.3 8.7  HGB 10.3* 10.9* 9.8*  HCT 31.8* 33.1* 31.2*   Recent Labs    12/18/18 1451 12/18/18 1914 12/19/18 0422  NA 134* 133* 137  K 5.2* 4.8 4.9  CL 105 104 105  CO2 19* 18* 21*  GLUCOSE 209* 218* 177*  BUN 44* 52* 55*  CREATININE 2.64* 3.07* 2.98*  CALCIUM 8.5* 8.1* 8.4*   Recent Labs    01/03/2019 2129 12/19/18 1214  INR 1.7* 1.8*   No results for input(s): LABURIN in the last 72 hours. Results for orders placed or performed during the hospital encounter of 12/16/2018  Blood Culture (routine x 2)     Status: None (Preliminary result)   Collection Time: 12/13/2018  9:20 PM   Specimen: BLOOD  Result Value Ref Range Status   Specimen Description BLOOD RIGHT HAND  Final   Special Requests   Final    BOTTLES DRAWN AEROBIC AND ANAEROBIC Blood Culture adequate volume   Culture   Final    NO GROWTH 2 DAYS Performed at [facility], [address] [place], [place] [address]    Report Status PENDING  Incomplete  Blood Culture (routine x 2)     Status: None (Preliminary result)   Collection Time: 12/21/2018  9:29 PM   Specimen: BLOOD  Result Value Ref Range Status   Specimen Description BLOOD RIGHT ANTECUBITAL  Final   Special Requests   Final    BOTTLES DRAWN AEROBIC AND ANAEROBIC Blood Culture adequate volume   Culture   Final    NO GROWTH 2 DAYS Performed at [facility], [address], [place], [place] [address]    Report Status PENDING  Incomplete  Respiratory  Panel by RT PCR (Flu A&B, Covid) -     Status: None   Collection Time: 12/31/2018 11:41 PM  Result Value Ref Range Status   SARS Coronavirus 2 by RT PCR NEGATIVE NEGATIVE Final    Comment: (NOTE) SARS-CoV-2 target nucleic acids are NOT DETECTED. The SARS-CoV-2 RNA is generally detectable in upper respiratoy specimens during the acute phase of infection. The lowest concentration of SARS-CoV-2 viral copies this assay can detect is 131 copies/mL. A negative result does not preclude SARS-Cov-2 infection and should not be used as the sole basis for treatment or other patient management decisions. A negative result may occur with  improper specimen collection/handling, submission of  specimen other than nasopharyngeal swab, presence of viral mutation(s) within the areas targeted by this assay, and inadequate number of viral copies (<131 copies/mL). A negative result must be combined with clinical observations, patient history, and epidemiological information. The expected result is Negative. Fact Sheet for Patients:  PinkCheek.be Fact Sheet for Healthcare Providers:  GravelBags.it This test is not yet ap proved or cleared by the [place] FDA and  has been authorized for detection and/or diagnosis of SARS-CoV-2 by FDA under an Emergency Use Authorization (EUA). This EUA will remain  in effect (meaning this test can be used) for the duration of the COVID-19 declaration under Section 564(b)(1) of the Act, 21 U.S.C. section 360bbb-3(b)(1), unless the authorization is terminated or revoked sooner.    Influenza A by PCR NEGATIVE NEGATIVE Final   Influenza B by PCR NEGATIVE NEGATIVE Final    Comment: (NOTE) The Xpert Xpress SARS-CoV-2/FLU/RSV assay is intended as an aid in  the diagnosis of influenza from Nasopharyngeal swab specimens and  should not be used as a sole basis for treatment. Nasal washings and  aspirates are unacceptable  for Xpert Xpress SARS-CoV-2/FLU/RSV  testing. Fact Sheet for Patients: PinkCheek.be Fact Sheet for Healthcare Providers: GravelBags.it This test is not yet approved or cleared by the [place] FDA and  has been authorized for detection and/or diagnosis of SARS-CoV-2 by  FDA under an Emergency Use Authorization (EUA). This EUA will remain  in effect (meaning this test can be used) for the duration of the  Covid-19 declaration under Section 564(b)(1) of the Act, 21  U.S.C. section 360bbb-3(b)(1), unless the authorization is  terminated or revoked. Performed at [facility], [address] [place], [place] [address]   Urine culture     Status: None   Collection Time: 12/18/18  4:45 AM   Specimen: Urine, Random  Result Value Ref Range Status   Specimen Description   Final    URINE, RANDOM Performed at [facility], [address], [place], [place] [address]    Special Requests   Final    NONE Performed at [facility], [address] [place], [place] [address]    Culture   Final    NO GROWTH Performed at [facility], [place] [address], [place], [place] [address]    Report Status 12/19/2018 FINAL  Final  CULTURE, BLOOD (ROUTINE X 2) w Reflex to ID Panel     Status: None (Preliminary result)   Collection Time: 12/18/18  8:40 AM   Specimen: BLOOD  Result Value Ref Range Status   Specimen Description BLOOD BLOOD RIGHT FOREARM  Final   Special Requests   Final    BOTTLES DRAWN AEROBIC AND ANAEROBIC Blood Culture results may not be optimal due to an inadequate volume of blood received in culture bottles   Culture   Final    NO GROWTH < 24 HOURS Performed at [facility], [address] [place],  [address]    Report Status PENDING  Incomplete  CULTURE, BLOOD (ROUTINE X 2) w Reflex to ID Panel     Status: None (Preliminary result)   Collection Time: 12/18/18  9:02 AM    Specimen: BLOOD  Result Value Ref Range Status   Specimen Description BLOOD BLOOD LEFT ARM  Final   Special Requests   Final    BOTTLES DRAWN AEROBIC AND ANAEROBIC Blood Culture adequate volume   Culture   Final    NO GROWTH < 24 HOURS Performed  at [facility], [address] [place], [place] [address]    Report Status PENDING  Incomplete  MRSA PCR Screening     Status: None   Collection Time: 12/18/18  9:14 AM   Specimen: Nasal Mucosa; Nasopharyngeal  Result Value Ref Range Status   MRSA by PCR NEGATIVE NEGATIVE Final    Comment:        The GeneXpert MRSA Assay (FDA approved for NASAL specimens only), is one component of a comprehensive MRSA colonization surveillance program. It is not intended to diagnose MRSA infection nor to guide or monitor treatment for MRSA infections. Performed at [facility], [address], [place], [place] [address]      Radiologic Imaging: DG Chest 1 View  Result Date: 12/19/2018 CLINICAL DATA:  Shortness of breath. EXAM: CHEST  1 VIEW COMPARISON:  Radiograph 12/29/2018 FINDINGS: Lower lung volumes from prior exam. Cardiomegaly is not significantly changed allowing for differences in technique. Bronchovascular crowding the similar interstitial prominence to prior. No confluent airspace disease. No large pleural effusion. No pneumothorax. Unchanged osseous structures. IMPRESSION: 1. Lower lung volumes from prior exam. Cardiomegaly is grossly stable. 2. Bronchovascular crowding secondary to lower lung volumes with grossly unchanged interstitial prominence. Electronically Signed   By: Keith Rake M.D.   On: 12/19/2018 05:12   US RENAL  Result Date: 12/19/2018 CLINICAL DATA:  Acute renal failure, history bladder calculi, BPH, coronary artery disease, CHF, diabetes mellitus, hypertension, prostate cancer EXAM: RENAL / URINARY TRACT ULTRASOUND COMPLETE COMPARISON:  Abdomen ultrasound 08/22/2017 FINDINGS: Right Kidney: Renal  measurements: 11.0 x 5.4 x 5.8 cm = volume: 180 mL. Degradation of image quality secondary to body habitus and anasarca. Grossly normal cortical thickness and echogenicity. No definite renal mass or hydronephrosis. Left Kidney: Renal measurements: 11.2 x 4.6 x 4.9 cm = volume: 131 mL. Suboptimal visualization due to body habitus and anasarca. No gross evidence of renal mass or hydronephrosis Bladder: Appears normal for degree of bladder distention. Calculated volume 398 mL. Other: N/A IMPRESSION: Suboptimal visualization of the kidneys due to body habitus and anasarca. No definite renal sonographic abnormalities identified. Electronically Signed   By: Lavonia Dana M.D.   On: 12/19/2018 11:18   DG Chest Port 1 View  Result Date: 12/19/2018 CLINICAL DATA:  Encounter for central line placement. EXAM: PORTABLE CHEST 1 VIEW COMPARISON:  Chest x-ray 12/19/2018. FINDINGS: Endotracheal tube, left IJ line, NG tube in stable position. Cardiomegaly. Mild bilateral subsegmental atelectasis and/or infiltrates again noted without interim change. No pleural effusion or pneumothorax. IMPRESSION: 1. Interim placement left IJ line, its tip is over the superior vena cava. Endotracheal tube and NG tube in stable position. 2.  Stable cardiomegaly. 3. Mild bilateral subsegmental atelectasis and or infiltrate again noted without interim change. Electronically Signed   By: Marcello Moores  Register   On: 12/19/2018 09:26   DG Chest Port 1 View  Result Date: 12/19/2018 CLINICAL DATA:  Status post intubation. EXAM: PORTABLE CHEST 1 VIEW COMPARISON:  Same day. FINDINGS: Stable cardiomediastinal silhouette. Endotracheal and nasogastric tubes are in grossly good position. No pneumothorax is noted. No significant pleural effusion is noted. Left upper lobe linear opacities are noted concerning for atelectasis or possibly infiltrates. Bony thorax is unremarkable. IMPRESSION: Endotracheal and nasogastric tubes are in grossly good position. Left  upper lobe linear opacities are noted concerning for atelectasis or possibly infiltrates. Follow-up radiographs are recommended. Electronically Signed   By: Marijo Conception M.D.   On: 12/19/2018 08:23   DG Chest  Port 1 View  Result Date: 12/05/2018 CLINICAL DATA:  Initial evaluation for acute fever, cough, congestion. EXAM: PORTABLE CHEST 1 VIEW COMPARISON:  Prior CT from 06/20/2017 FINDINGS: Mild cardiomegaly. Mediastinal silhouette within normal limits. Lungs hypoinflated. Mild diffuse interstitial prominence, which could reflect interstitial congestion and/or atypical pneumonitis. No consolidative opacity identified. No frank alveolar edema. No pleural effusion. No pneumothorax. No acute osseous finding. IMPRESSION: 1. Mild diffuse interstitial prominence, which could reflect interstitial congestion and/or atypical pneumonitis. No consolidative opacity identified. 2. Mild cardiomegaly without frank edema. Electronically Signed   By: Jeannine Boga M.D.   On: 12/07/2018 21:50   DG Abd Portable 1V  Result Date: 12/19/2018 CLINICAL DATA:  NG tube placement EXAM: PORTABLE ABDOMEN - 1 VIEW COMPARISON:  12/19/2018 FINDINGS: NG tube projects over the mid stomach body. Mild gaseous distention of the colon and small bowel in the left abdomen. No definite obstruction pattern. No abnormal calcifications. Degenerative changes of the spine. IMPRESSION: NG tube within the mid stomach body.  Nonspecific bowel gas pattern. Electronically Signed   By: Jerilynn Mages.  Shick M.D.   On: 12/19/2018 08:23    Procedure note: Indications: Inability to place Foley catheter with history of urethral stricture  Procedure: Cystoscopy with urethral dilation  Anesthesia: Intubated/sedated  Findings: Dense bulbar urethral stricture Dilation unsuccessful  Description: External genitalia were prepped and draped in the usual fashion.  A flexible cystoscope was lubricated and passed per urethra.  A pinpoint stricture was noted in  the bulbar urethra.  A 0.038 sensor wire was placed through the cystoscope and through the stricture.  It advanced without resistance.  The cystoscope was removed and an 8 French urethral dilator was advanced over the wire.  Minimal resistance was met in the region of the bulbar urethra.  Serial dilators up to 12 [place] were placed when urethral bleeding was noted.  The guidewire was removed and no urine was obtained.  It was elected at this point to abort the procedure as the guidewire did not appear to be in the bladder.  The dilator and guidewire were removed.  Mild urethral bleeding was noted.  Impression/Assessment:  84 y.o. male with a dense bulbar urethral stricture.  Attempt at dilation was unsuccessful.  Options were discussed with Dr. Mortimer Fries of continued condom catheter drainage versus SP tube placement by interventional radiology.  SP tube recommended by critical care for maximal drainage and output monitoring.    12/19/2018, 1:51 PM  John Giovanni,  MD

## 2018-12-19 NOTE — Consult Note (Signed)
ANTICOAGULATION CONSULT NOTE - Initial Consult  Pharmacy Consult for Heparin Indication: atrial fibrillation  Allergies  Allergen Reactions  . No Known Allergies     Patient Measurements: Height: 6' 3.98" (193 cm) Weight: 298 lb 11.6 oz (135.5 kg) IBW/kg (Calculated) : 86.76 Heparin Dosing Weight: 116.6 kg  Vital Signs: Temp: 98.9 F (37.2 C) (12/15 1935) Temp Source: Axillary (12/15 1935) BP: 97/60 (12/15 2100) Pulse Rate: 88 (12/15 2100)  Labs: Recent Labs    01/04/2019 2129 12/18/18 0848 12/18/18 1115 12/18/18 1451 12/18/18 1914 12/19/18 0422 12/19/18 0442 12/19/18 0843 12/19/18 1214 12/19/18 1952  HGB 10.3* 10.9*  --   --   --  9.8*  --   --   --   --   HCT 31.8* 33.1*  --   --   --  31.2*  --   --   --   --   PLT 120* 141*  --   --   --  122*  --   --   --   --   APTT 35  --   --   --   --   --   --   --   --  >160*  LABPROT 19.5*  --   --   --   --   --   --   --  21.0*  --   INR 1.7*  --   --   --   --   --   --   --  1.8*  --   CREATININE 2.55* 2.35*  --  2.64* 3.07* 2.98*  --   --   --   --   TROPONINIHS 6 133* 340*  --   --   --  594* 727*  --   --     Estimated Creatinine Clearance: 28.7 mL/min (A) (by C-G formula based on SCr of 2.98 mg/dL (H)).   Medical History: Past Medical History:  Diagnosis Date  . Anemia    hx  . Bilateral lower extremity edema   . Bladder calculi   . BPH (benign prostatic hyperplasia)   . CKD (chronic kidney disease)   . Coronary artery disease    cardiologist-- dr Loney Laurence [facility])  lov note in care everywhere tab in epic  . DDD (degenerative disc disease), lumbar   . Diabetes mellitus without complication ([place])   . Diastolic CHF, chronic ([place])   . Dyspnea    exersion  . Dysrhythmia    atrial fib  . GERD (gastroesophageal reflux disease)   . History of adenomatous polyp of colon    2012  . History of Barrett's esophagus   . History of basal cell carcinoma excision    Sept 2016  --scalp  .  History of colon cancer    1999  adenocarcinoma polyp  . History of hiatal hernia   . History of kidney stones   . History of kidney stones   . History of MI (myocardial infarction)    1996  . Hyperlipidemia   . Hypertension   . PAF (paroxysmal atrial fibrillation) ([place])    dx 2015  . Prostate cancer ([place])   . Urethral stricture   . Urinary incontinence      Assessment: 84 year-old male admitted for sepsis and pneumonia.  He has a medical history significant ofDM with hyperlipidemia,a. Fib (Eliquis), h/o CHF, h/o prostate cancer, h/o CAD.  Patient's last dose of Eliquis was 12/14 @ 2121.  Hemoglobin is 9.8  and platelets are 122; both are trending down.     Goal of Therapy:  Heparin level 0.3-0.7 units/ml Monitor platelets by anticoagulation protocol: Yes   Plan:  No bolus, as patient has recently taken Eliquis. Start 1600 units/hour continuous infusion. Monitor baseline 8-hour heparin level and aPTT.  Will likely follow aPTT as last dose of Eliquis was < 72 hours. Follow CBC with AM labs.  12/15:  APTT @ 1952 = > 160  Will decrease heparin drip rate to 1500 units/hr and recheck aPTT 8 hrs after rate change.   Orene Desanctis, PharmD Pharmacy Resident  12/19/2018 9:52 PM

## 2018-12-19 NOTE — Consult Note (Addendum)
[facility] CONSULT NOTE    Date: 12/19/2018                  Patient Name:  Carlos Obrien  MRN: [id number]  DOB: 03-10-1934  Age / Sex: 84 y.o., male         PCP: Idelle Crouch, MD                 Service Requesting Consult:  Critical care                 Reason for Consult:  Acute kidney injury/chronic kidney disease stage IIIb            History of Present Illness: Patient is a 84 y.o. male with a PMHx of Anemia of chronic kidney disease, bilateral lower extremity edema, nephrolithiasis, BPH, chronic kidney disease stage IIIb baseline creatinine 1.6 EGFR 42, coronary artery disease, degenerative disc disease, diabetes mellitus type 2, chronic diastolic heart failure, GERD, Barrett's esophagus, hyperlipidemia, hypertension, paroxysmal atrial fibrillation, prostate cancer, urethral stricture , who was admitted to [facility] on 12/22/2018 for evaluation of significant shortness of breath.  In the emergency department he was found to be febrile with temperature 101.6 as well as tachycardic.  Code sepsis was initiated and patient was administered vancomycin, cefepime, and Flagyl.  He had worsening shortness of breath and subsequently intubated.  He was negative for COVID-19.  He is now felt to have acute respiratory failure secondary to pneumonia.  We are now asked to see him for evaluation management of acute renal failure.  His creatinine currently is 2.98 with an EGFR of 19.  His baseline creatinine is 1.6 as above.   Medications: Outpatient medications: Medications Prior to Admission  Medication Sig Dispense Refill Last Dose  . apixaban (ELIQUIS) 5 MG TABS tablet Take 1 tablet (5 mg total) by mouth 2 (two) times daily. 60 tablet 0   . aspirin 81 MG EC tablet Take 81 mg by mouth daily.      Marland Kitchen atenolol (TENORMIN) 50 MG tablet Take 50 mg by mouth every morning.      Marland Kitchen atorvastatin (LIPITOR) 20 MG tablet Take 20 mg by mouth every evening.      . Cholecalciferol  (VITAMIN D) 2000 units tablet Take 2,000 Units by mouth daily.     . furosemide (LASIX) 20 MG tablet Take 40 mg by mouth every morning.      . gabapentin (NEURONTIN) 300 MG capsule Take 100 mg by mouth 3 (three) times daily.      Marland Kitchen glipiZIDE (GLUCOTROL) 5 MG tablet Take 5 mg by mouth 2 (two) times daily.      . Insulin Glargine (LANTUS SOLOSTAR) 100 UNIT/ML Solostar Pen Inject 26 Units into the skin at bedtime.      . isosorbide mononitrate (IMDUR) 30 MG 24 hr tablet Take 30 mg by mouth every morning.      . Liraglutide (VICTOZA) 18 MG/3ML SOPN Inject 1.8 mg into the skin every morning.      . magnesium oxide (MAG-OX) 400 MG tablet Take 400 mg by mouth every evening.      . meloxicam (MOBIC) 7.5 MG tablet Take 7.5 mg by mouth daily.     . metFORMIN (GLUCOPHAGE) 500 MG tablet Take 1,000 mg by mouth 2 (two) times daily with a meal.      . methocarbamol (ROBAXIN-750) 750 MG tablet Take 1 tablet (750 mg total) by mouth 3 (three) times daily as needed  for muscle spasms. 90 tablet 1   . nitroGLYCERIN (NITROSTAT) 0.4 MG SL tablet Place 0.4 mg under the tongue every 5 (five) minutes as needed for chest pain.     Marland Kitchen omeprazole (PRILOSEC) 40 MG capsule Take 40 mg by mouth every morning.      Marland Kitchen oxyCODONE-acetaminophen (PERCOCET) 7.5-325 MG tablet Take 1-2 tablets by mouth as needed for pain. 60 tablet 0   . pioglitazone (ACTOS) 30 MG tablet Take 30 mg by mouth every morning.      . ramipril (ALTACE) 2.5 MG capsule Take 2.5 mg by mouth every morning.      . silodosin (RAPAFLO) 8 MG CAPS capsule Take 8 mg by mouth daily with breakfast.     . sulfamethoxazole-trimethoprim (BACTRIM DS) 800-160 MG tablet Take 1 tablet by mouth 2 (two) times daily.     . vitamin B-12 (CYANOCOBALAMIN) 1000 MCG tablet Take 1,000 mcg by mouth daily.       Current medications: Current Facility-Administered Medications  Medication Dose Route Frequency Provider Last Rate Last Admin  . acetaminophen (TYLENOL) tablet 650 mg  650 mg  Oral TID Neena Rhymes, MD   650 mg at 12/19/18 0946  . amiodarone (NEXTERONE PREMIX) 360-4.14 MG/200ML-% (1.8 mg/mL) IV infusion  30 mg/hr Intravenous Continuous Flora Lipps, MD   Stopped at 12/19/18 [phone]  . aspirin chewable tablet 81 mg  81 mg Per Tube Daily Flora Lipps, MD   81 mg at 12/19/18 0946  . atorvastatin (LIPITOR) tablet 20 mg  20 mg Oral QPM Norins, Heinz Knuckles, MD   20 mg at 12/18/18 1650  . azithromycin (ZITHROMAX) tablet 500 mg  500 mg Oral Daily Flora Lipps, MD   500 mg at 12/19/18 0947  . budesonide (PULMICORT) nebulizer solution 0.5 mg  0.5 mg Nebulization BID Flora Lipps, MD   0.5 mg at 12/19/18 0736  . ceFEPIme (MAXIPIME) 2 g in sodium chloride 0.9 % 100 mL IVPB  2 g Intravenous Daily Flora Lipps, MD   Stopped at 12/18/18 1314  . Chlorhexidine Gluconate Cloth 2 % PADS 6 each  6 each Topical Daily Kasa, Kurian, MD      . fentaNYL 2553mg in NS 2575m(1042mml) infusion-PREMIX  0-400 mcg/hr Intravenous Continuous KasFlora LippsD 40 mL/hr at 12/19/18 1001 400 mcg/hr at 12/19/18 1001  . insulin aspart (novoLOG) injection 0-20 Units  0-20 Units Subcutaneous Q4H BlaAwilda BillP   3 Units at 12/19/18 0746  . insulin glargine (LANTUS) injection 26 Units  26 Units Subcutaneous QHS NorNeena RhymesD   26 Units at 12/18/18 2215  . ipratropium-albuterol (DUONEB) 0.5-2.5 (3) MG/3ML nebulizer solution 3 mL  3 mL Nebulization Q4H KasFlora LippsD   3 mL at 12/19/18 [id number] ipratropium-albuterol (DUONEB) 0.5-2.5 (3) MG/3ML nebulizer solution 3 mL  3 mL Nebulization Q6H PRN BlaAwilda BillP      . LORazepam (ATIVAN) injection 2 mg  2 mg Intravenous Q1H PRN KasFlora LippsD      . magnesium oxide (MAG-OX) tablet 400 mg  400 mg Per Tube QPM KasFlora LippsD      . pantoprazole sodium (PROTONIX) 40 mg/20 mL oral suspension 40 mg  40 mg Per Tube Daily KasFlora LippsD   40 mg at 12/19/18 0949  . phenylephrine (NEO-SYNEPHRINE) 10 mg in sodium chloride 0.9 % 250 mL (0.04 mg/mL)  infusion  0-150 mcg/min Intravenous Titrated BlaAwilda BillP 20 mL/hr at 12/19/18 1001 25 mcg/min at  12/19/18 1001  . Racepinephrine HCl 2.25 % nebulizer solution           . vecuronium (NORCURON) injection 10 mg  10 mg Intravenous Q1H PRN Flora Lipps, MD          Allergies: Allergies  Allergen Reactions  . No Known Allergies       Past Medical History: Past Medical History:  Diagnosis Date  . Anemia    hx  . Bilateral lower extremity edema   . Bladder calculi   . BPH (benign prostatic hyperplasia)   . CKD (chronic kidney disease)   . Coronary artery disease    cardiologist-- dr Loney Laurence [facility] clinic)  lov note in care everywhere tab in epic  . DDD (degenerative disc disease), lumbar   . Diabetes mellitus without complication ([place])   . Diastolic CHF, chronic ([place])   . Dyspnea    exersion  . Dysrhythmia    atrial fib  . GERD (gastroesophageal reflux disease)   . History of adenomatous polyp of colon    2012  . History of Barrett's esophagus   . History of basal cell carcinoma excision    Sept 2016  --scalp  . History of colon cancer    1999  adenocarcinoma polyp  . History of hiatal hernia   . History of kidney stones   . History of kidney stones   . History of MI (myocardial infarction)    1996  . Hyperlipidemia   . Hypertension   . PAF (paroxysmal atrial fibrillation) ([place])    dx 2015  . Prostate cancer ([place])   . Urethral stricture   . Urinary incontinence      Past Surgical History: Past Surgical History:  Procedure Laterality Date  . BLEPHAROPLASTY Bilateral Fall 2015  . CORONARY ANGIOPLASTY  May 1996  dr fath [facility] date verified w/ medical records)   per cardiologist note in epic (dr Loney Laurence)  2 vessel CAD-- occluded RCA and 60% LAD  . CYSTOSCOPY WITH HOLMIUM LASER LITHOTRIPSY N/A 01/08/2015   Procedure: CYSTOSCOPY WITH REMOVAL OF BLADDER STONE;  Surgeon: Rana Snare, MD;  Location: [facility];  Service: Urology;   Laterality: N/A;  . CYSTOSCOPY WITH RETROGRADE PYELOGRAM, URETEROSCOPY AND STENT PLACEMENT  Aug 2015  . CYSTOSCOPY WITH URETHRAL DILATATION N/A 01/08/2015   Procedure: CYSTOSCOPY WITH URETHRAL DILATATION;  Surgeon: Rana Snare, MD;  Location: [facility];  Service: Urology;  Laterality: N/A;  . LUMBAR LAMINECTOMY/DECOMPRESSION MICRODISCECTOMY N/A 02/09/2016   Procedure: Lumbar four  to Sacral one Laminectomy for decompression;  Surgeon: Kevan Ny Ditty, MD;  Location: [place];  Service: Neurosurgery;  Laterality: N/A;  L4 to S1 Laminectomy for decompression  . PARTIAL KNEE ARTHROPLASTY Right 11-08-2012  . TRANSTHORACIC ECHOCARDIOGRAM  10-22-2013   mild concentric LVH,  grade 1 diastolic dysfunction,  ef 50-55%,  mild MR and TR,  trivial PR     Family History: History reviewed. No pertinent family history.   Social History: Social History   Socioeconomic History  . Marital status: Married    Spouse name: Not on file  . Number of children: Not on file  . Years of education: Not on file  . Highest education level: Not on file  Occupational History  . Not on file  Tobacco Use  . Smoking status: Never Smoker  . Smokeless tobacco: Never Used  Substance and Sexual Activity  . Alcohol use: No    Alcohol/week: 0.0 standard drinks  . Drug use: No  .  Sexual activity: Not on file  Other Topics Concern  . Not on file  Social History Narrative  . Not on file   Social Determinants of Health   Financial Resource Strain:   . Difficulty of Paying Living Expenses: Not on file  Food Insecurity:   . Worried About Charity fundraiser in the Last Year: Not on file  . Ran Out of Food in the Last Year: Not on file  Transportation Needs:   . Lack of Transportation (Medical): Not on file  . Lack of Transportation (Non-Medical): Not on file  Physical Activity:   . Days of Exercise per Week: Not on file  . Minutes of Exercise per Session: Not on file  Stress:   . Feeling of  Stress : Not on file  Social Connections:   . Frequency of Communication with Friends and Family: Not on file  . Frequency of Social Gatherings with Friends and Family: Not on file  . Attends Religious Services: Not on file  . Active Member of Clubs or Organizations: Not on file  . Attends Archivist Meetings: Not on file  . Marital Status: Not on file  Intimate Partner Violence:   . Fear of Current or Ex-Partner: Not on file  . Emotionally Abused: Not on file  . Physically Abused: Not on file  . Sexually Abused: Not on file     Review of Systems: Patient unable to provide as he is currently intubated and sedated.  Vital Signs: Blood pressure 137/68, pulse (!) 112, temperature 97.6 F (36.4 C), temperature source Oral, resp. rate 13, height 6' 4"  (1.93 m), weight 135.5 kg, SpO2 92 %.  Weight trends: Filed Weights   12/12/2018 2125 12/18/18 0500 12/18/18 1000  Weight: 133.8 kg (!) 137.1 kg 135.5 kg    Physical Exam: General: Critically ill-appearing  Head: Endotracheal tube noted to be in place  Eyes: Anicteric, spontaneous EOMs noted  Nose: Mucous membranes moist, not inflammed, nonerythematous.  Throat: Endotracheal tube noted to be in place  Neck: Supple, trachea midline.  Lungs:  Bilateral rales and rhonchi noted.  Heart: S1 and S2, tachycardic  Abdomen:  Obese, soft, nontender, nondistended  Extremities: 1+ lower extremity edema  Neurologic: Intubated, sedated  Skin: No visible rashes, scars.    Lab results: Basic Metabolic Panel: Recent Labs  Lab 12/18/18 1451 12/18/18 1914 12/19/18 0422  NA 134* 133* 137  K 5.2* 4.8 4.9  CL 105 104 105  CO2 19* 18* 21*  GLUCOSE 209* 218* 177*  BUN 44* 52* 55*  CREATININE 2.64* 3.07* 2.98*  CALCIUM 8.5* 8.1* 8.4*  MG  --   --  1.9  PHOS  --   --  4.7*    Liver Function Tests: Recent Labs  Lab 12/15/2018 2129  AST 47*  ALT 25  ALKPHOS 76  BILITOT 0.9  PROT 6.6  ALBUMIN 3.5   No results for input(s):  LIPASE, AMYLASE in the last 168 hours. No results for input(s): AMMONIA in the last 168 hours.  CBC: Recent Labs  Lab 12/07/2018 2129 12/18/18 0848 12/19/18 0422  WBC 4.5 8.3 8.7  NEUTROABS 3.3  --   --   HGB 10.3* 10.9* 9.8*  HCT 31.8* 33.1* 31.2*  MCV 91.9 92.7 97.2  PLT 120* 141* 122*    Cardiac Enzymes: No results for input(s): CKTOTAL, CKMB, CKMBINDEX, TROPONINI in the last 168 hours.  BNP: Invalid input(s): POCBNP  CBG: Recent Labs  Lab 12/18/18 0915 12/18/18 1220 12/18/18  1616 12/18/18 2117 12/19/18 0739  GLUCAP 143* 183* 170* 201* 138*    Microbiology: Results for orders placed or performed during the hospital encounter of 12/27/2018  Blood Culture (routine x 2)     Status: None (Preliminary result)   Collection Time: 12/22/2018  9:20 PM   Specimen: BLOOD  Result Value Ref Range Status   Specimen Description BLOOD RIGHT HAND  Final   Special Requests   Final    BOTTLES DRAWN AEROBIC AND ANAEROBIC Blood Culture adequate volume   Culture   Final    NO GROWTH 2 DAYS Performed at [facility], [address] [place], [place] [address]    Report Status PENDING  Incomplete  Blood Culture (routine x 2)     Status: None (Preliminary result)   Collection Time: 01/03/2019  9:29 PM   Specimen: BLOOD  Result Value Ref Range Status   Specimen Description BLOOD RIGHT ANTECUBITAL  Final   Special Requests   Final    BOTTLES DRAWN AEROBIC AND ANAEROBIC Blood Culture adequate volume   Culture   Final    NO GROWTH 2 DAYS Performed at [facility], [address], [place], [place] [address]    Report Status PENDING  Incomplete  Respiratory Panel by RT PCR (Flu A&B, Covid) -     Status: None   Collection Time: 01/04/2019 11:41 PM  Result Value Ref Range Status   SARS Coronavirus 2 by RT PCR NEGATIVE NEGATIVE Final    Comment: (NOTE) SARS-CoV-2 target nucleic acids are NOT DETECTED. The SARS-CoV-2 RNA is generally detectable in upper  respiratoy specimens during the acute phase of infection. The lowest concentration of SARS-CoV-2 viral copies this assay can detect is 131 copies/mL. A negative result does not preclude SARS-Cov-2 infection and should not be used as the sole basis for treatment or other patient management decisions. A negative result may occur with  improper specimen collection/handling, submission of specimen other than nasopharyngeal swab, presence of viral mutation(s) within the areas targeted by this assay, and inadequate number of viral copies (<131 copies/mL). A negative result must be combined with clinical observations, patient history, and epidemiological information. The expected result is Negative. Fact Sheet for Patients:  PinkCheek.be Fact Sheet for Healthcare Providers:  GravelBags.it This test is not yet ap proved or cleared by the [place] FDA and  has been authorized for detection and/or diagnosis of SARS-CoV-2 by FDA under an Emergency Use Authorization (EUA). This EUA will remain  in effect (meaning this test can be used) for the duration of the COVID-19 declaration under Section 564(b)(1) of the Act, 21 U.S.C. section 360bbb-3(b)(1), unless the authorization is terminated or revoked sooner.    Influenza A by PCR NEGATIVE NEGATIVE Final   Influenza B by PCR NEGATIVE NEGATIVE Final    Comment: (NOTE) The Xpert Xpress SARS-CoV-2/FLU/RSV assay is intended as an aid in  the diagnosis of influenza from Nasopharyngeal swab specimens and  should not be used as a sole basis for treatment. Nasal washings and  aspirates are unacceptable for Xpert Xpress SARS-CoV-2/FLU/RSV  testing. Fact Sheet for Patients: PinkCheek.be Fact Sheet for Healthcare Providers: GravelBags.it This test is not yet approved or cleared by the [place] FDA and  has been authorized for  detection and/or diagnosis of SARS-CoV-2 by  FDA under an Emergency Use Authorization (EUA). This EUA will remain  in effect (meaning this test can be used) for the duration of the  Covid-19 declaration under Section 564(b)(1) of the  Act, 21  U.S.C. section 360bbb-3(b)(1), unless the authorization is  terminated or revoked. Performed at [facility], [address] [place], [place] [address]   CULTURE, BLOOD (ROUTINE X 2) w Reflex to ID Panel     Status: None (Preliminary result)   Collection Time: 12/18/18  8:40 AM   Specimen: BLOOD  Result Value Ref Range Status   Specimen Description BLOOD BLOOD RIGHT FOREARM  Final   Special Requests   Final    BOTTLES DRAWN AEROBIC AND ANAEROBIC Blood Culture results may not be optimal due to an inadequate volume of blood received in culture bottles   Culture   Final    NO GROWTH < 24 HOURS Performed at [facility], [address], [place], [place] [address]    Report Status PENDING  Incomplete  CULTURE, BLOOD (ROUTINE X 2) w Reflex to ID Panel     Status: None (Preliminary result)   Collection Time: 12/18/18  9:02 AM   Specimen: BLOOD  Result Value Ref Range Status   Specimen Description BLOOD BLOOD LEFT ARM  Final   Special Requests   Final    BOTTLES DRAWN AEROBIC AND ANAEROBIC Blood Culture adequate volume   Culture   Final    NO GROWTH < 24 HOURS Performed at [facility], [address] [place], [place] [address]    Report Status PENDING  Incomplete  MRSA PCR Screening     Status: None   Collection Time: 12/18/18  9:14 AM   Specimen: Nasal Mucosa; Nasopharyngeal  Result Value Ref Range Status   MRSA by PCR NEGATIVE NEGATIVE Final    Comment:        The GeneXpert MRSA Assay (FDA approved for NASAL specimens only), is one component of a comprehensive MRSA colonization surveillance program. It is not intended to diagnose MRSA infection nor to guide or monitor treatment for MRSA  infections. Performed at [facility], [address] [place],  [address]     Coagulation Studies: Recent Labs    12/16/2018 2127/03/09  LABPROT 19.5*  INR 1.7*    Urinalysis: Recent Labs    12/18/18 0445  COLORURINE YELLOW*  LABSPEC 1.017  PHURINE 5.0  GLUCOSEU NEGATIVE  HGBUR MODERATE*  BILIRUBINUR NEGATIVE  KETONESUR NEGATIVE  PROTEINUR NEGATIVE  NITRITE NEGATIVE  LEUKOCYTESUR NEGATIVE      Imaging: DG Chest 1 View  Result Date: 12/19/2018 CLINICAL DATA:  Shortness of breath. EXAM: CHEST  1 VIEW COMPARISON:  Radiograph 12/20/2018 FINDINGS: Lower lung volumes from prior exam. Cardiomegaly is not significantly changed allowing for differences in technique. Bronchovascular crowding the similar interstitial prominence to prior. No confluent airspace disease. No large pleural effusion. No pneumothorax. Unchanged osseous structures. IMPRESSION: 1. Lower lung volumes from prior exam. Cardiomegaly is grossly stable. 2. Bronchovascular crowding secondary to lower lung volumes with grossly unchanged interstitial prominence. Electronically Signed   By: Keith Rake M.D.   On: 12/19/2018 05:12   DG Chest Port 1 View  Result Date: 12/19/2018 CLINICAL DATA:  Encounter for central line placement. EXAM: PORTABLE CHEST 1 VIEW COMPARISON:  Chest x-ray 12/19/2018. FINDINGS: Endotracheal tube, left IJ line, NG tube in stable position. Cardiomegaly. Mild bilateral subsegmental atelectasis and/or infiltrates again noted without interim change. No pleural effusion or pneumothorax. IMPRESSION: 1. Interim placement left IJ line, its tip is over the superior vena cava. Endotracheal tube and NG tube in stable position. 2.  Stable cardiomegaly. 3. Mild bilateral subsegmental atelectasis and or infiltrate again noted  without interim change. Electronically Signed   By: Marcello Moores  Register   On: 12/19/2018 09:26   DG Chest Port 1 View  Result Date: 12/19/2018 CLINICAL DATA:  Status post  intubation. EXAM: PORTABLE CHEST 1 VIEW COMPARISON:  Same day. FINDINGS: Stable cardiomediastinal silhouette. Endotracheal and nasogastric tubes are in grossly good position. No pneumothorax is noted. No significant pleural effusion is noted. Left upper lobe linear opacities are noted concerning for atelectasis or possibly infiltrates. Bony thorax is unremarkable. IMPRESSION: Endotracheal and nasogastric tubes are in grossly good position. Left upper lobe linear opacities are noted concerning for atelectasis or possibly infiltrates. Follow-up radiographs are recommended. Electronically Signed   By: Marijo Conception M.D.   On: 12/19/2018 08:23   DG Chest Port 1 View  Result Date: 12/24/2018 CLINICAL DATA:  Initial evaluation for acute fever, cough, congestion. EXAM: PORTABLE CHEST 1 VIEW COMPARISON:  Prior CT from 06/20/2017 FINDINGS: Mild cardiomegaly. Mediastinal silhouette within normal limits. Lungs hypoinflated. Mild diffuse interstitial prominence, which could reflect interstitial congestion and/or atypical pneumonitis. No consolidative opacity identified. No frank alveolar edema. No pleural effusion. No pneumothorax. No acute osseous finding. IMPRESSION: 1. Mild diffuse interstitial prominence, which could reflect interstitial congestion and/or atypical pneumonitis. No consolidative opacity identified. 2. Mild cardiomegaly without frank edema. Electronically Signed   By: Jeannine Boga M.D.   On: 12/27/2018 21:50   DG Abd Portable 1V  Result Date: 12/19/2018 CLINICAL DATA:  NG tube placement EXAM: PORTABLE ABDOMEN - 1 VIEW COMPARISON:  12/19/2018 FINDINGS: NG tube projects over the mid stomach body. Mild gaseous distention of the colon and small bowel in the left abdomen. No definite obstruction pattern. No abnormal calcifications. Degenerative changes of the spine. IMPRESSION: NG tube within the mid stomach body.  Nonspecific bowel gas pattern. Electronically Signed   By: Jerilynn Mages.  Shick M.D.   On:  12/19/2018 08:23      Assessment & Plan: Pt is a 84 y.o. male with a PMHx of Anemia of chronic kidney disease, bilateral lower extremity edema, nephrolithiasis, BPH, chronic kidney disease stage IIIb baseline creatinine 1.6 EGFR 42, coronary artery disease, degenerative disc disease, diabetes mellitus type 2, chronic diastolic heart failure, GERD, Barrett's esophagus, hyperlipidemia, hypertension, paroxysmal atrial fibrillation, prostate cancer, urethral stricture , who was admitted to [facility] on 12/31/2018 for evaluation of significant shortness of breath.   1.  Acute kidney injury/chronic kidney disease stage IIIb baseline creatinine 1.6 with a EGFR of 42.  Suspect acute kidney injury likely secondary to sepsis.  However urinary retention may be playing some role as bladder scan showed 400 cc of urine within the bladder.  Urology has been consulted for this issue.  If his renal function does not improve with enhanced Foley drainage we will consider continuous renal replacement therapy.  Case was discussed in depth with pulmonary/critical care.  We will proceed with additional serologic work-up including SPEP, UPEP, ANA, ANCA antibodies, GBM antibodies, C3, and C4.  2.  Acute respiratory failure.  Secondary to pneumonia.  Patient maintained on ventilatory support at this time.  3.  Metabolic acidosis.  pH currently 7.28 with PCO2 of 42 and PO2 of 241.  Suspect that sepsis playing a role.  Continue to monitor pH closely.

## 2018-12-19 NOTE — Progress Notes (Signed)
PT Cancellation Note  Patient Details Name: COREYON NICOTRA MRN: [id number] DOB: [birth year]   Cancelled Treatment:    Reason Eval/Treat Not Completed: Other (comment). Pt with increased respiratory difficulty with emergent intubation this AM. Will cancel current PT orders. Please re-order when medically appropriate.   Eiden Bagot 12/19/2018, 9:50 AM  Greggory Stallion, PT, DPT [phone]

## 2018-12-19 NOTE — Consult Note (Signed)
PHARMACY CONSULT NOTE - FOLLOW UP  Pharmacy Consult for Electrolyte Monitoring and Replacement   Recent Labs: Potassium (mmol/L)  Date Value  12/19/2018 4.9  08/27/2011 4.6   Magnesium (mg/dL)  Date Value  12/19/2018 1.9   Calcium (mg/dL)  Date Value  12/19/2018 8.4 (L)   Calcium, Total (mg/dL)  Date Value  08/27/2011 8.7   Albumin (g/dL)  Date Value  12/12/2018 3.5   Phosphorus (mg/dL)  Date Value  12/19/2018 4.7 (H)   Sodium (mmol/L)  Date Value  12/19/2018 137  08/27/2011 70    84 year old male patient admitted with pneumonia.  He has a medical history significant forDM with hyperlipidemia, atrial fibrillation, h/o CHF, h/o prostate cancer, h/o CAD.  He is currently on an amiodarone drip for atrial fibrillation.  Assessment: Mag 1.9  Goal of Therapy:  Electrolytes wnl  Plan:  Give magnesium 1 g IV x 1 dose Electrolytes with AM labs.  Gerald Dexter, PharmD Pharmacy Resident  12/19/2018 5:42 PM

## 2018-12-19 NOTE — Progress Notes (Signed)
Initial Nutrition Assessment  DOCUMENTATION CODES:   Obesity unspecified  INTERVENTION:  Initiate Vital High Protein at 50 mL/hr (1200 mL goal daily volume) + Pro-Stat 60 mL TID per tube. Provides 1800 kcal, 195 grams of protein, 1008 mL H2O daily.  Provide liquid MVI daily per tube.  Provide minimum free water flush of 30 mL Q4hrs to maintain tube patency.  NUTRITION DIAGNOSIS:   Inadequate oral intake related to inability to eat as evidenced by NPO status.  GOAL:   Provide needs based on ASPEN/SCCM guidelines  MONITOR:   Vent status, Labs, Weight trends, TF tolerance, I & O's  REASON FOR ASSESSMENT:   Ventilator    ASSESSMENT:   84 year old male with PMHx of HLD, GERD, BPH, hx hiatal hernia, hx MI, HTN, paroxysmal A-fib, diastolic CHF, Barrett's esophagus, CAD, CKD, DM admitte with sepsis, PNA, respiratory failure requiring intubation on 12/15, AKI.   Patient intubated and sedated. On PRVC mode with FiO2 60% and PEEP 10 cmH2O. Abdomen soft per RN documentation. Last BM was 12/14 (large type 7). Plan is to start tube feeds today. Patient does not meet criteria for malnutrition.  Enteral Access: OGT placed 12/15; terminates in mid stomach body per abdominal x-ray 12/15; cm marking not yet documented  MAP: 62-100 mmHg  Patient is currently intubated on ventilator support Ve: 9 L/min Temp (24hrs), Avg:99.8 F (37.7 C), Min:97.6 F (36.4 C), Max:101.4 F (38.6 C)  Propofol: N/A  Medications reviewed and include: azithromycin, Novolog 0-20 units Q4hrs, Lantus 26 units QHS, magnesium oxide 400 mg QHS per tube, pantoprazole, amiodarone, cefepime, fentanyl gtt, phenylephrine gtt at 25 mcg/min.  Labs reviewed: CBG 138-201, CO2 21, BUN 55, Creatinine 2.98, Phosphorus 4.7.  I/O: 125 mL UOP overnight  NUTRITION - FOCUSED PHYSICAL EXAM:    Most Recent Value  Orbital Region  No depletion  Upper Arm Region  No depletion  Thoracic and Lumbar Region  No depletion  Buccal  Region  Unable to assess  Temple Region  No depletion  Clavicle Bone Region  No depletion  Clavicle and Acromion Bone Region  No depletion  Scapular Bone Region  Unable to assess  Dorsal Hand  No depletion  Patellar Region  No depletion  Anterior Thigh Region  No depletion  Posterior Calf Region  No depletion  Edema (RD Assessment)  Mild  Hair  Reviewed  Eyes  Unable to assess  Mouth  Unable to assess  Skin  Reviewed  Nails  Reviewed     Diet Order:   Diet Order    None     EDUCATION NEEDS:   No education needs have been identified at this time  Skin:  Skin Assessment: Reviewed RN Assessment  Last BM:  12/18/2018 - large type 7  Height:   Ht Readings from Last 1 Encounters:  12/18/18 6\' 4"  (1.93 m)   Weight:   Wt Readings from Last 1 Encounters:  12/18/18 135.5 kg   Ideal Body Weight:  91.8 kg  BMI:  Body mass index is 36.36 kg/m.  Estimated Nutritional Needs:   Kcal:  1491-1897 (11-14 kcal/kg  Protein:  184 grams (2 grams/kg IBW)  Fluid:  2.2 L/day  Jacklynn Barnacle, MS, RD, LDN Office: [phone] Pager: [phone] After Hours/Weekend Pager: [phone]

## 2018-12-19 NOTE — Progress Notes (Signed)
Pharmacy Antibiotic Note  Carlos Obrien is a 84 y.o. male admitted on 12/25/2018 with pneumonia. Patient presented in ED with 1-2 days of feeling sick at home. Patient admitted to ICU with sepsis work up with tachycadia fever, and hypertension. Patient prevously started on Bactrim DS BID on 12/8 by outpatient MD. Pharmacy has been consulted for cefepime dosing. Patient is currently on antibiotic day 3.    Plan: Cefepime 2g IV Q24hr.   Height: 6\' 4"  (193 cm) Weight: 298 lb 11.6 oz (135.5 kg) IBW/kg (Calculated) : 86.8  Temp (24hrs), Avg:99.1 F (37.3 C), Min:97.6 F (36.4 C), Max:100.4 F (38 C)  Recent Labs  Lab 12/15/2018 2129 01/03/2019 2341 12/18/18 0848 12/18/18 0858 12/18/18 1451 12/18/18 1914 12/19/18 0422 12/19/18 0843  WBC 4.5  --  8.3  --   --   --  8.7  --   CREATININE 2.55*  --  2.35*  --  2.64* 3.07* 2.98*  --   LATICACIDVEN 4.6* 3.3*  --  3.9*  --   --   --  4.3*    Estimated Creatinine Clearance: 28.7 mL/min (A) (by C-G formula based on SCr of 2.98 mg/dL (H)).    Allergies  Allergen Reactions  . No Known Allergies     Antimicrobials this admission: Vancomycin 12/13 >> 12/14 Metronidazole 12/13 x 1 Cefepime 12/13 >>  Azithromycin 12/14 >>   Dose adjustments this admission: N/A  Microbiology results: 12/14 BCx: no growth < 24 hours 12/14 UCx: no growth 12/14 MRSA PCR: negative  12/13 Influenza A & B: negative   Thank you for allowing pharmacy to be a part of this patient's care.  Gerald Dexter 12/19/2018 5:27 PM

## 2018-12-19 NOTE — Procedures (Signed)
Endotracheal Intubation: Patient required placement of an artificial airway secondary to Respiratory Failure  Consent: Emergent.   Hand washing performed prior to starting the procedure.   Medications administered for sedation prior to procedure:  ETOMIDATE 20 mg IV,  ROCuronium 10 mg IV, Fentanyl 100 mcg IV.    A time out procedure was called and correct patient, name, & ID confirmed. Needed supplies and equipment were assembled and checked to include ETT, 10 ml syringe, Glidescope, Mac and Miller blades, suction, oxygen and bag mask valve, end tidal CO2 monitor.   Patient was positioned to align the mouth and pharynx to facilitate visualization of the glottis.   Heart rate, SpO2 and blood pressure was continuously monitored during the procedure. Pre-oxygenation was conducted prior to intubation and endotracheal tube was placed through the vocal cords into the trachea.     The artificial airway was placed under direct visualization via glidescope route using a 8.0 ETT on the first attempt.  ETT was secured at 23 cm mark.  Placement was confirmed by auscuitation of lungs with good breath sounds bilaterally and no stomach sounds.  Condensation was noted on endotracheal tube.   Pulse ox 98%.  CO2 detector in place with appropriate color change.   Complications: None .   Operator: Moniqua Engebretsen.   Chest radiograph ordered and pending.   Comments: OGT placed via glidescope.  Corrin Parker, M.D.  [facility]  Medical Director [place] Director [facility] Cardio-Pulmonary Department

## 2018-12-19 NOTE — Progress Notes (Signed)
Lab called stating pts aptt came back at greater than 160. I notified Darlyn Chamber, np and he advised me to notify pharmacy. I notified and they said they would let jason know and he would let me know what to continue for the heaprin drip.

## 2018-12-19 NOTE — Consult Note (Signed)
ANTICOAGULATION CONSULT NOTE - Initial Consult  Pharmacy Consult for Heparin Indication: atrial fibrillation  Allergies  Allergen Reactions  . No Known Allergies     Patient Measurements: Height: 6\' 4"  (193 cm) Weight: 298 lb 11.6 oz (135.5 kg) IBW/kg (Calculated) : 86.8 Heparin Dosing Weight: 116.6 kg  Vital Signs: Temp: 97.6 F (36.4 C) (12/15 0000) Temp Source: Oral (12/15 0000) BP: 137/68 (12/15 0800) Pulse Rate: 112 (12/15 0800)  Labs: Recent Labs    12/11/2018 2129 12/18/18 0848 12/18/18 1115 12/18/18 1451 12/18/18 1914 12/19/18 0422 12/19/18 0442 12/19/18 0843  HGB 10.3* 10.9*  --   --   --  9.8*  --   --   HCT 31.8* 33.1*  --   --   --  31.2*  --   --   PLT 120* 141*  --   --   --  122*  --   --   APTT 35  --   --   --   --   --   --   --   LABPROT 19.5*  --   --   --   --   --   --   --   INR 1.7*  --   --   --   --   --   --   --   CREATININE 2.55* 2.35*  --  2.64* 3.07* 2.98*  --   --   TROPONINIHS 6 133* 340*  --   --   --  594* 727*    Estimated Creatinine Clearance: 28.7 mL/min (A) (by C-G formula based on SCr of 2.98 mg/dL (H)).   Medical History: Past Medical History:  Diagnosis Date  . Anemia    hx  . Bilateral lower extremity edema   . Bladder calculi   . BPH (benign prostatic hyperplasia)   . CKD (chronic kidney disease)   . Coronary artery disease    cardiologist-- dr Loney Laurence [facility])  lov note in care everywhere tab in epic  . DDD (degenerative disc disease), lumbar   . Diabetes mellitus without complication ([place])   . Diastolic CHF, chronic ([place])   . Dyspnea    exersion  . Dysrhythmia    atrial fib  . GERD (gastroesophageal reflux disease)   . History of adenomatous polyp of colon    2012  . History of Barrett's esophagus   . History of basal cell carcinoma excision    Sept 2016  --scalp  . History of colon cancer    1999  adenocarcinoma polyp  . History of hiatal hernia   . History of kidney stones   .  History of kidney stones   . History of MI (myocardial infarction)    1996  . Hyperlipidemia   . Hypertension   . PAF (paroxysmal atrial fibrillation) ([place])    dx 2015  . Prostate cancer ([place])   . Urethral stricture   . Urinary incontinence      Assessment: 84 year-old male admitted for sepsis and pneumonia.  He has a medical history significant ofDM with hyperlipidemia,a. Fib (Eliquis), h/o CHF, h/o prostate cancer, h/o CAD.  Patient's last dose of Eliquis was 12/14 @ 2121.  Hemoglobin is 9.8 and platelets are 122; both are trending down.     Goal of Therapy:  Heparin level 0.3-0.7 units/ml Monitor platelets by anticoagulation protocol: Yes   Plan:  No bolus, as patient has recently taken Eliquis. Start 1600 units/hour continuous infusion. Monitor baseline 8-hour  heparin level and aPTT.  Will likely follow aPTT as last dose of Eliquis was < 72 hours. Follow CBC with AM labs.  Gerald Dexter, PharmD Pharmacy Resident  12/19/2018 12:04 PM

## 2018-12-19 NOTE — Progress Notes (Signed)
Urology was consulted to place Foley catheter Patient has very dense urethral stricture and unable to place Foley catheter IR was consulted to place suprapubic catheter  Vitamin K 10 mg IV x1 to be given prior to procedure for elevated INR 1.8 This should help with postoperative bleeding  Case discussed with interventional radiologist

## 2018-12-20 ENCOUNTER — Inpatient Hospital Stay: Payer: Medicare Other

## 2018-12-20 LAB — BASIC METABOLIC PANEL
Anion gap: 13 (ref 5–15)
BUN: 74 mg/dL — ABNORMAL HIGH (ref 8–23)
CO2: 18 mmol/L — ABNORMAL LOW (ref 22–32)
Calcium: 7.5 mg/dL — ABNORMAL LOW (ref 8.9–10.3)
Chloride: 105 mmol/L (ref 98–111)
Creatinine, Ser: 4.37 mg/dL — ABNORMAL HIGH (ref 0.61–1.24)
GFR calc Af Amer: 14 mL/min — ABNORMAL LOW (ref >60)
GFR calc non Af Amer: 12 mL/min — ABNORMAL LOW (ref >60)
Glucose, Bld: 211 mg/dL — ABNORMAL HIGH (ref 70–99)
Potassium: 5.4 mmol/L — ABNORMAL HIGH (ref 3.5–5.1)
Sodium: 136 mmol/L (ref 135–145)

## 2018-12-20 LAB — RENAL FUNCTION PANEL
Albumin: 3.1 g/dL — ABNORMAL LOW (ref 3.5–5.0)
Anion gap: 8 (ref 5–15)
BUN: 70 mg/dL — ABNORMAL HIGH (ref 8–23)
CO2: 22 mmol/L (ref 22–32)
Calcium: 7.6 mg/dL — ABNORMAL LOW (ref 8.9–10.3)
Chloride: 104 mmol/L (ref 98–111)
Creatinine, Ser: 3.61 mg/dL — ABNORMAL HIGH (ref 0.61–1.24)
GFR calc Af Amer: 17 mL/min — ABNORMAL LOW (ref >60)
GFR calc non Af Amer: 15 mL/min — ABNORMAL LOW (ref >60)
Glucose, Bld: 224 mg/dL — ABNORMAL HIGH (ref 70–99)
Phosphorus: 4.3 mg/dL (ref 2.5–4.6)
Potassium: 5.3 mmol/L — ABNORMAL HIGH (ref 3.5–5.1)
Sodium: 134 mmol/L — ABNORMAL LOW (ref 135–145)

## 2018-12-20 LAB — GLUCOSE, CAPILLARY
Glucose-Capillary: 154 mg/dL — ABNORMAL HIGH (ref 70–99)
Glucose-Capillary: 160 mg/dL — ABNORMAL HIGH (ref 70–99)
Glucose-Capillary: 172 mg/dL — ABNORMAL HIGH (ref 70–99)
Glucose-Capillary: 187 mg/dL — ABNORMAL HIGH (ref 70–99)
Glucose-Capillary: 190 mg/dL — ABNORMAL HIGH (ref 70–99)
Glucose-Capillary: 226 mg/dL — ABNORMAL HIGH (ref 70–99)

## 2018-12-20 LAB — PROTEIN ELECTROPHORESIS, SERUM
A/G Ratio: 1.4 (ref 0.7–1.7)
Albumin ELP: 3.2 g/dL (ref 2.9–4.4)
Alpha-1-Globulin: 0.3 g/dL (ref 0.0–0.4)
Alpha-2-Globulin: 0.5 g/dL (ref 0.4–1.0)
Beta Globulin: 0.8 g/dL (ref 0.7–1.3)
Gamma Globulin: 0.6 g/dL (ref 0.4–1.8)
Globulin, Total: 2.3 g/dL (ref 2.2–3.9)
Total Protein ELP: 5.5 g/dL — ABNORMAL LOW (ref 6.0–8.5)

## 2018-12-20 LAB — CBC
HCT: 31 % — ABNORMAL LOW (ref 39.0–52.0)
Hemoglobin: 9.7 g/dL — ABNORMAL LOW (ref 13.0–17.0)
MCH: 30.3 pg (ref 26.0–34.0)
MCHC: 31.3 g/dL (ref 30.0–36.0)
MCV: 96.9 fL (ref 80.0–100.0)
Platelets: 159 10*3/uL (ref 150–400)
RBC: 3.2 MIL/uL — ABNORMAL LOW (ref 4.22–5.81)
RDW: 21.8 % — ABNORMAL HIGH (ref 11.5–15.5)
WBC: 19.2 10*3/uL — ABNORMAL HIGH (ref 4.0–10.5)
nRBC: 0.2 % (ref 0.0–0.2)

## 2018-12-20 LAB — LACTIC ACID, PLASMA: Lactic Acid, Venous: 1.6 mmol/L (ref 0.5–1.9)

## 2018-12-20 LAB — PHOSPHORUS: Phosphorus: 5 mg/dL — ABNORMAL HIGH (ref 2.5–4.6)

## 2018-12-20 LAB — APTT: aPTT: >160 seconds (ref 24–36)

## 2018-12-20 LAB — ABO/RH: ABO/RH(D): A POS

## 2018-12-20 LAB — PROTIME-INR
INR: 2 — ABNORMAL HIGH (ref 0.8–1.2)
Prothrombin Time: 22.6 seconds — ABNORMAL HIGH (ref 11.4–15.2)

## 2018-12-20 LAB — MPO/PR-3 (ANCA) ANTIBODIES
ANCA Proteinase 3: <3.5 U/mL (ref 0.0–3.5)
Myeloperoxidase Abs: <9 U/mL (ref 0.0–9.0)

## 2018-12-20 LAB — C3 COMPLEMENT: C3 Complement: 113 mg/dL (ref 82–167)

## 2018-12-20 LAB — GLOMERULAR BASEMENT MEMBRANE ANTIBODIES: GBM Ab: 5 units (ref 0–20)

## 2018-12-20 LAB — MAGNESIUM: Magnesium: 2.2 mg/dL (ref 1.7–2.4)

## 2018-12-20 LAB — HEPARIN LEVEL (UNFRACTIONATED): Heparin Unfractionated: >3.6 IU/mL — ABNORMAL HIGH (ref 0.30–0.70)

## 2018-12-20 LAB — PROCALCITONIN: Procalcitonin: 7 ng/mL

## 2018-12-20 LAB — C4 COMPLEMENT: Complement C4, Body Fluid: 21 mg/dL (ref 12–38)

## 2018-12-20 MED ORDER — PRISMASOL BGK 4/2.5 32-4-2.5 MEQ/L REPLACEMENT SOLN
Status: DC
  Administered 2018-12-23 (×2): 1000 via INTRAVENOUS_CENTRAL
  Filled 2018-12-20: qty 5000

## 2018-12-20 MED ORDER — SODIUM CHLORIDE 0.9 % IV SOLN
INTRAVENOUS | Status: DC | PRN
  Administered 2018-12-20: 250 mL via INTRAVENOUS

## 2018-12-20 MED ORDER — PRISMASOL BGK 4/2.5 32-4-2.5 MEQ/L REPLACEMENT SOLN
Status: DC
  Administered 2018-12-23 (×3): 1500 via INTRAVENOUS_CENTRAL
  Filled 2018-12-20: qty 5000

## 2018-12-20 MED ORDER — POLYETHYLENE GLYCOL 3350 17 G PO PACK
17.0000 g | PACK | Freq: Every day | ORAL | Status: DC
  Administered 2018-12-20 – 2018-12-25 (×6): 17 g
  Filled 2018-12-20 (×6): qty 1

## 2018-12-20 MED ORDER — HEPARIN SODIUM (PORCINE) 1000 UNIT/ML DIALYSIS
1000.0000 [IU] | INTRAMUSCULAR | Status: DC | PRN
  Administered 2018-12-22: 6000 [IU] via INTRAVENOUS_CENTRAL
  Administered 2018-12-24 – 2018-12-25 (×2): 2800 [IU] via INTRAVENOUS_CENTRAL
  Filled 2018-12-20: qty 6
  Filled 2018-12-20: qty 3
  Filled 2018-12-20: qty 6
  Filled 2018-12-20: qty 3
  Filled 2018-12-20 (×3): qty 6

## 2018-12-20 MED ORDER — SODIUM CHLORIDE 0.9 % IV SOLN
2.0000 g | Freq: Two times a day (BID) | INTRAVENOUS | Status: AC
  Administered 2018-12-20 – 2018-12-24 (×8): 2 g via INTRAVENOUS
  Filled 2018-12-20 (×9): qty 2

## 2018-12-20 MED ORDER — ORAL CARE MOUTH RINSE
15.0000 mL | OROMUCOSAL | Status: DC
  Administered 2018-12-20 – 2018-12-26 (×58): 15 mL via OROMUCOSAL

## 2018-12-20 MED ORDER — SENNOSIDES-DOCUSATE SODIUM 8.6-50 MG PO TABS
2.0000 | ORAL_TABLET | Freq: Two times a day (BID) | ORAL | Status: DC
  Administered 2018-12-20 – 2018-12-25 (×12): 2
  Filled 2018-12-20 (×12): qty 2

## 2018-12-20 MED ORDER — CHLORHEXIDINE GLUCONATE 0.12% ORAL RINSE (MEDLINE KIT)
15.0000 mL | Freq: Two times a day (BID) | OROMUCOSAL | Status: DC
  Administered 2018-12-20 – 2018-12-26 (×13): 15 mL via OROMUCOSAL

## 2018-12-20 MED ORDER — SODIUM CHLORIDE 0.9% IV SOLUTION: Freq: Once | INTRAVENOUS | Status: DC

## 2018-12-20 MED ORDER — HEPARIN (PORCINE) 25000 UT/250ML-% IV SOLN: 1100.0000 [IU]/h | INTRAVENOUS | Status: DC

## 2018-12-20 MED ORDER — PRISMASOL BGK 4/2.5 32-4-2.5 MEQ/L IV SOLN
INTRAVENOUS | Status: DC
  Filled 2018-12-20: qty 5000

## 2018-12-20 NOTE — Progress Notes (Signed)
Pharmacy Antibiotic Note  Carlos Obrien is a 84 y.o. male admitted on 12/30/2018 with pneumonia. Patient presented in ED with 1-2 days of feeling sick at home. Patient admitted to ICU with sepsis work up with tachycadia fever, and hypertension. Patient prevously started on Bactrim DS BID on 12/8 by outpatient MD. Pharmacy has been consulted for cefepime dosing. Patient is currently on antibiotic day 4.  Patient recently started on CRRT for progressive renal failure.  Plan: Cefepime 2g IV Q12 hours   Height: 6' 3.98" (193 cm) Weight: (!) 311 lb 4.6 oz (141.2 kg) IBW/kg (Calculated) : 86.76  Temp (24hrs), Avg:98.7 F (37.1 C), Min:98.1 F (36.7 C), Max:99.1 F (37.3 C)  Recent Labs  Lab 12/25/2018 2129 12/24/2018 2341 12/18/18 0848 12/18/18 0858 12/18/18 1451 12/18/18 1914 12/19/18 0422 12/19/18 0843 12/20/18 0420  WBC 4.5  --  8.3  --   --   --  8.7  --  19.2*  CREATININE 2.55*  --  2.35*  --  2.64* 3.07* 2.98*  --  4.37*  LATICACIDVEN 4.6* 3.3*  --  3.9*  --   --   --  4.3*  --     Estimated Creatinine Clearance: 20 mL/min (A) (by C-G formula based on SCr of 4.37 mg/dL (H)).    Allergies  Allergen Reactions  . No Known Allergies     Antimicrobials this admission: Vancomycin 12/13 >> 12/14 Metronidazole 12/13 x 1 Cefepime 12/13 >>  Azithromycin 12/14 >>   Dose adjustments this admission: N/A  Microbiology results: 12/14 BCx: no growth < 2 days 12/14 UCx: no growth 12/14 MRSA PCR: negative  12/13 Influenza A & B: negative   Thank you for allowing pharmacy to be a part of this patient's care.  Gerald Dexter 12/20/2018 1:27 PM

## 2018-12-20 NOTE — Consult Note (Signed)
ANTICOAGULATION CONSULT NOTE - Follow up  Pharmacy Consult for Heparin Indication: atrial fibrillation  Allergies  Allergen Reactions  . No Known Allergies     Patient Measurements: Height: 6' 3.98" (193 cm) Weight: (!) 311 lb 4.6 oz (141.2 kg) IBW/kg (Calculated) : 86.76 Heparin Dosing Weight: 116.6 kg  Vital Signs: Temp: 99.3 F (37.4 C) (12/16 1545) Temp Source: Axillary (12/16 1545) BP: 109/70 (12/16 1545) Pulse Rate: 83 (12/16 1515)  Labs: Recent Labs    12/15/2018 2129 12/18/18 0848 12/18/18 0848 12/18/18 1115 12/18/18 1914 12/19/18 0422 12/19/18 0442 12/19/18 0843 12/19/18 1214 12/19/18 1952 12/20/18 0420 12/20/18 0450  HGB 10.3* 10.9*  --   --   --  9.8*  --   --   --   --  9.7*  --   HCT 31.8* 33.1*  --   --   --  31.2*  --   --   --   --  31.0*  --   PLT 120* 141*  --   --   --  122*  --   --   --   --  159  --   APTT 35  --   --   --   --   --   --   --   --  >160* >160*  --   LABPROT 19.5*  --   --   --   --   --   --   --  21.0*  --   --  22.6*  INR 1.7*  --   --   --   --   --   --   --  1.8*  --   --  2.0*  HEPARINUNFRC  --   --   --   --   --   --   --   --   --   --  >3.60*  --   CREATININE 2.55* 2.35*   < >  --  3.07* 2.98*  --   --   --   --  4.37*  --   TROPONINIHS 6 133*  --  340*  --   --  594* 727*  --   --   --   --    < > = values in this interval not displayed.    Estimated Creatinine Clearance: 20 mL/min (A) (by C-G formula based on SCr of 4.37 mg/dL (H)).   Medical History: Past Medical History:  Diagnosis Date  . Anemia    hx  . Bilateral lower extremity edema   . Bladder calculi   . BPH (benign prostatic hyperplasia)   . CKD (chronic kidney disease)   . Coronary artery disease    cardiologist-- dr Loney Laurence [facility] clinic)  lov note in care everywhere tab in epic  . DDD (degenerative disc disease), lumbar   . Diabetes mellitus without complication ([place])   . Diastolic CHF, chronic ([place])   . Dyspnea    exersion  .  Dysrhythmia    atrial fib  . GERD (gastroesophageal reflux disease)   . History of adenomatous polyp of colon    2012  . History of Barrett's esophagus   . History of basal cell carcinoma excision    Sept 2016  --scalp  . History of colon cancer    1999  adenocarcinoma polyp  . History of hiatal hernia   . History of kidney stones   . History of kidney stones   . History of  MI (myocardial infarction)    1996  . Hyperlipidemia   . Hypertension   . PAF (paroxysmal atrial fibrillation) ([place])    dx 2015  . Prostate cancer ([place])   . Urethral stricture   . Urinary incontinence      Assessment: 84 year-old male admitted for sepsis and pneumonia.  He has a medical history significant ofDM with hyperlipidemia,a. Fib (Eliquis), h/o CHF, h/o prostate cancer, h/o CAD.  Patient's last dose of Eliquis was 12/14 @ 2121.  Hemoglobin is 9.7 and platelets are 159; both are stable.     Goal of Therapy:  Heparin level 0.3-0.7 units/ml Monitor platelets by anticoagulation protocol: Yes   Plan:  APTT >160, anti-Xa >3.6.  Both levels are supratherapeutic. Heparin currently on hold, as patient experienced penile bleed this morning.  FFP was given and patient underwent IR suprapubic catheter placement.   Pharmacy will follow up with provider during AM rounds to determine plan for heparin restart.  Gerald Dexter, PharmD Pharmacy Resident  12/20/2018 4:06 PM

## 2018-12-20 NOTE — Progress Notes (Signed)
[facility]  ROUNDING NOTE   Subjective:  Patient remains critically ill at the moment. Patient continues to have significant renal dysfunction. Urine output was only 150 cc over the preceding 24 hours. Suprapubic catheter is to be placed at the bedside today.   Objective:  Vital signs in last 24 hours:  Temp:  [98.1 F (36.7 C)-100.1 F (37.8 C)] 98.6 F (37 C) (12/16 0800) Pulse Rate:  [72-112] 86 (12/16 0900) Resp:  [0-17] 16 (12/16 0900) BP: (77-133)/(51-81) 109/73 (12/16 0900) SpO2:  [96 %-100 %] 98 % (12/16 0900) FiO2 (%):  [40 %-60 %] 40 % (12/16 0814) Weight:  [141.2 kg] 141.2 kg (12/16 0401)  Weight change: 5.7 kg Filed Weights   12/18/18 0500 12/18/18 1000 12/20/18 0401  Weight: (!) 137.1 kg 135.5 kg (!) 141.2 kg    Intake/Output: I/O last 3 completed shifts: In: 6581.8 [I.V.:5731.8; IV [id number]:347] Out: 275 [Urine:275]   Intake/Output this shift:  Total I/O In: 1017.1 [I.V.:155.5; NG/GT:861.7] Out: -   Physical Exam: General: Critically ill-appearing  Head: Endotracheal tube in place  Eyes: Anicteric  Neck: Supple, trachea midline  Lungs:  Bilateral rhonchi, vent assisted  Heart: S1S2 irregular  Abdomen:  Soft, nontender, bowel sounds present  Extremities: 1+ peripheral edema.  Neurologic: Intubated, sedated  Skin: No lesions  Access: Left IJ temporary dialysis catheter    Basic Metabolic Panel: Recent Labs  Lab 12/18/18 0848 12/18/18 1451 12/18/18 1914 12/19/18 0422 12/20/18 0420  NA 135 134* 133* 137 136  K 5.3* 5.2* 4.8 4.9 5.4*  CL 103 105 104 105 105  CO2 18* 19* 18* 21* 18*  GLUCOSE 169* 209* 218* 177* 211*  BUN 39* 44* 52* 55* 74*  CREATININE 2.35* 2.64* 3.07* 2.98* 4.37*  CALCIUM 8.7* 8.5* 8.1* 8.4* 7.5*  MG  --   --   --  1.9 2.2  PHOS  --   --   --  4.7* 5.0*    Liver Function Tests: Recent Labs  Lab 12/15/2018 2129  AST 47*  ALT 25  ALKPHOS 76  BILITOT 0.9  PROT 6.6  ALBUMIN 3.5   No results for  input(s): LIPASE, AMYLASE in the last 168 hours. No results for input(s): AMMONIA in the last 168 hours.  CBC: Recent Labs  Lab 01/03/2019 2129 12/18/18 0848 12/19/18 0422 12/20/18 0420  WBC 4.5 8.3 8.7 19.2*  NEUTROABS 3.3  --   --   --   HGB 10.3* 10.9* 9.8* 9.7*  HCT 31.8* 33.1* 31.2* 31.0*  MCV 91.9 92.7 97.2 96.9  PLT 120* 141* 122* 159    Cardiac Enzymes: No results for input(s): CKTOTAL, CKMB, CKMBINDEX, TROPONINI in the last 168 hours.  BNP: Invalid input(s): POCBNP  CBG: Recent Labs  Lab 12/19/18 1619 12/19/18 2050 12/19/18 2356 12/20/18 0405 12/20/18 0743  GLUCAP 262* 257* 237* 70* 154*    Microbiology: Results for orders placed or performed during the hospital encounter of 12/13/2018  Blood Culture (routine x 2)     Status: None (Preliminary result)   Collection Time: 01/03/2019  9:20 PM   Specimen: BLOOD  Result Value Ref Range Status   Specimen Description BLOOD RIGHT HAND  Final   Special Requests   Final    BOTTLES DRAWN AEROBIC AND ANAEROBIC Blood Culture adequate volume   Culture   Final    NO GROWTH 3 DAYS Performed at [facility], [address] [place], [place] [address]    Report Status PENDING  Incomplete  Blood Culture (routine x 2)     Status: None (Preliminary result)   Collection Time: 12/19/2018  9:29 PM   Specimen: BLOOD  Result Value Ref Range Status   Specimen Description BLOOD RIGHT ANTECUBITAL  Final   Special Requests   Final    BOTTLES DRAWN AEROBIC AND ANAEROBIC Blood Culture adequate volume   Culture   Final    NO GROWTH 3 DAYS Performed at [facility], [address] [place], [place] [address]    Report Status PENDING  Incomplete  Respiratory Panel by RT PCR (Flu A&B, Covid) -     Status: None   Collection Time: 12/06/2018 11:41 PM  Result Value Ref Range Status   SARS Coronavirus 2 by RT PCR NEGATIVE NEGATIVE Final    Comment: (NOTE) SARS-CoV-2 target nucleic acids are NOT DETECTED. The SARS-CoV-2  RNA is generally detectable in upper respiratoy specimens during the acute phase of infection. The lowest concentration of SARS-CoV-2 viral copies this assay can detect is 131 copies/mL. A negative result does not preclude SARS-Cov-2 infection and should not be used as the sole basis for treatment or other patient management decisions. A negative result may occur with  improper specimen collection/handling, submission of specimen other than nasopharyngeal swab, presence of viral mutation(s) within the areas targeted by this assay, and inadequate number of viral copies (<131 copies/mL). A negative result must be combined with clinical observations, patient history, and epidemiological information. The expected result is Negative. Fact Sheet for Patients:  PinkCheek.be Fact Sheet for Healthcare Providers:  GravelBags.it This test is not yet ap proved or cleared by the [place] FDA and  has been authorized for detection and/or diagnosis of SARS-CoV-2 by FDA under an Emergency Use Authorization (EUA). This EUA will remain  in effect (meaning this test can be used) for the duration of the COVID-19 declaration under Section 564(b)(1) of the Act, 21 U.S.C. section 360bbb-3(b)(1), unless the authorization is terminated or revoked sooner.    Influenza A by PCR NEGATIVE NEGATIVE Final   Influenza B by PCR NEGATIVE NEGATIVE Final    Comment: (NOTE) The Xpert Xpress SARS-CoV-2/FLU/RSV assay is intended as an aid in  the diagnosis of influenza from Nasopharyngeal swab specimens and  should not be used as a sole basis for treatment. Nasal washings and  aspirates are unacceptable for Xpert Xpress SARS-CoV-2/FLU/RSV  testing. Fact Sheet for Patients: PinkCheek.be Fact Sheet for Healthcare Providers: GravelBags.it This test is not yet approved or cleared by the [place] FDA  and  has been authorized for detection and/or diagnosis of SARS-CoV-2 by  FDA under an Emergency Use Authorization (EUA). This EUA will remain  in effect (meaning this test can be used) for the duration of the  Covid-19 declaration under Section 564(b)(1) of the Act, 21  U.S.C. section 360bbb-3(b)(1), unless the authorization is  terminated or revoked. Performed at [facility], [address], [place], [place] [address]   Urine culture     Status: None   Collection Time: 12/18/18  4:45 AM   Specimen: Urine, Random  Result Value Ref Range Status   Specimen Description   Final    URINE, RANDOM Performed at [facility], [address], [place], [place] [address]    Special Requests   Final    NONE Performed at [facility], [address] [place], [place] [address]    Culture   Final    NO GROWTH Performed at [facility], [address] [place], [place] [address]    Report Status 12/19/2018 FINAL  Final  CULTURE, BLOOD (ROUTINE X 2) w Reflex to ID Panel     Status: None (Preliminary result)   Collection Time: 12/18/18  8:40 AM   Specimen: BLOOD  Result Value Ref Range Status   Specimen Description BLOOD BLOOD RIGHT FOREARM  Final   Special Requests   Final    BOTTLES DRAWN AEROBIC AND ANAEROBIC Blood Culture results may not be optimal due to an inadequate volume of blood received in culture bottles   Culture   Final    NO GROWTH 2 DAYS Performed at [facility], [address] [place], [place] [address]    Report Status PENDING  Incomplete  CULTURE, BLOOD (ROUTINE X 2) w Reflex to ID Panel     Status: None (Preliminary result)   Collection Time: 12/18/18  9:02 AM   Specimen: BLOOD  Result Value Ref Range Status   Specimen Description BLOOD BLOOD LEFT ARM  Final   Special Requests   Final    BOTTLES DRAWN AEROBIC AND ANAEROBIC Blood Culture adequate volume   Culture   Final    NO GROWTH 2 DAYS Performed at [facility], [address] [place], [place] [address]    Report Status PENDING  Incomplete  MRSA PCR Screening     Status: None   Collection Time: 12/18/18  9:14 AM   Specimen: Nasal Mucosa; Nasopharyngeal  Result Value Ref Range Status   MRSA by PCR NEGATIVE NEGATIVE Final    Comment:        The GeneXpert MRSA Assay (FDA approved for NASAL specimens only), is one component of a comprehensive MRSA colonization surveillance program. It is not intended to diagnose MRSA infection nor to guide or monitor treatment for MRSA infections. Performed at [facility], [address] [place], [place] [address]     Coagulation Studies: Recent Labs    12/10/2018 26-Mar-2127 12/19/18 1214 12/20/18 0450  LABPROT 19.5* 21.0* 22.6*  INR 1.7* 1.8* 2.0*    Urinalysis: Recent Labs    12/18/18 0445  COLORURINE YELLOW*  LABSPEC 1.017  PHURINE 5.0  GLUCOSEU NEGATIVE  HGBUR MODERATE*  BILIRUBINUR NEGATIVE  KETONESUR NEGATIVE  PROTEINUR NEGATIVE  NITRITE NEGATIVE  LEUKOCYTESUR NEGATIVE      Imaging: DG Chest 1 View  Result Date: 12/19/2018 CLINICAL DATA:  Shortness of breath. EXAM: CHEST  1 VIEW COMPARISON:  Radiograph 12/12/2018 FINDINGS: Lower lung volumes from prior exam. Cardiomegaly is not significantly changed allowing for differences in technique. Bronchovascular crowding the similar interstitial prominence to prior. No confluent airspace disease. No large pleural effusion. No pneumothorax. Unchanged osseous structures. IMPRESSION: 1. Lower lung volumes from prior exam. Cardiomegaly is grossly stable. 2. Bronchovascular crowding secondary to lower lung volumes with grossly unchanged interstitial prominence. Electronically Signed   By: Keith Rake M.D.   On: 12/19/2018 05:12   US RENAL  Result Date: 12/19/2018 CLINICAL DATA:  Acute renal failure, history bladder calculi, BPH, coronary artery disease, CHF, diabetes mellitus, hypertension, prostate cancer EXAM: RENAL /  URINARY TRACT ULTRASOUND COMPLETE COMPARISON:  Abdomen ultrasound 08/22/2017 FINDINGS: Right Kidney: Renal measurements: 11.0 x 5.4 x 5.8 cm = volume: 180 mL. Degradation of image quality secondary to body habitus and anasarca. Grossly normal cortical thickness and echogenicity. No definite renal mass or hydronephrosis. Left Kidney: Renal measurements: 11.2 x 4.6 x 4.9 cm = volume: 131 mL. Suboptimal visualization due to body habitus and anasarca. No  gross evidence of renal mass or hydronephrosis Bladder: Appears normal for degree of bladder distention. Calculated volume 398 mL. Other: N/A IMPRESSION: Suboptimal visualization of the kidneys due to body habitus and anasarca. No definite renal sonographic abnormalities identified. Electronically Signed   By: Lavonia Dana M.D.   On: 12/19/2018 11:18   DG Chest Port 1 View  Result Date: 12/19/2018 CLINICAL DATA:  Encounter for central line placement. EXAM: PORTABLE CHEST 1 VIEW COMPARISON:  Chest x-ray 12/19/2018. FINDINGS: Endotracheal tube, left IJ line, NG tube in stable position. Cardiomegaly. Mild bilateral subsegmental atelectasis and/or infiltrates again noted without interim change. No pleural effusion or pneumothorax. IMPRESSION: 1. Interim placement left IJ line, its tip is over the superior vena cava. Endotracheal tube and NG tube in stable position. 2.  Stable cardiomegaly. 3. Mild bilateral subsegmental atelectasis and or infiltrate again noted without interim change. Electronically Signed   By: Marcello Moores  Register   On: 12/19/2018 09:26   DG Chest Port 1 View  Result Date: 12/19/2018 CLINICAL DATA:  Status post intubation. EXAM: PORTABLE CHEST 1 VIEW COMPARISON:  Same day. FINDINGS: Stable cardiomediastinal silhouette. Endotracheal and nasogastric tubes are in grossly good position. No pneumothorax is noted. No significant pleural effusion is noted. Left upper lobe linear opacities are noted concerning for atelectasis or possibly infiltrates. Bony  thorax is unremarkable. IMPRESSION: Endotracheal and nasogastric tubes are in grossly good position. Left upper lobe linear opacities are noted concerning for atelectasis or possibly infiltrates. Follow-up radiographs are recommended. Electronically Signed   By: Marijo Conception M.D.   On: 12/19/2018 08:23   DG Abd Portable 1V  Result Date: 12/19/2018 CLINICAL DATA:  NG tube placement EXAM: PORTABLE ABDOMEN - 1 VIEW COMPARISON:  12/19/2018 FINDINGS: NG tube projects over the mid stomach body. Mild gaseous distention of the colon and small bowel in the left abdomen. No definite obstruction pattern. No abnormal calcifications. Degenerative changes of the spine. IMPRESSION: NG tube within the mid stomach body.  Nonspecific bowel gas pattern. Electronically Signed   By: Jerilynn Mages.  Shick M.D.   On: 12/19/2018 08:23     Medications:   .  prismasol BGK 4/2.5    .  prismasol BGK 4/2.5    . amiodarone 30 mg/hr (12/20/18 0900)  . ceFEPime (MAXIPIME) IV Stopped (12/19/18 1148)  . feeding supplement (VITAL HIGH PROTEIN) Stopped (12/20/18 0844)  . fentaNYL infusion INTRAVENOUS 300 mcg/hr (12/20/18 0900)  . norepinephrine (LEVOPHED) Adult infusion 10 mcg/min (12/20/18 0900)  . phenylephrine (NEO-SYNEPHRINE) Adult infusion Stopped (12/20/18 0753)  . prismasol BGK 4/2.5     . sodium chloride   Intravenous Once  . aspirin  81 mg Per Tube Daily  . atorvastatin  20 mg Oral QPM  . azithromycin  500 mg Oral Daily  . budesonide (PULMICORT) nebulizer solution  0.5 mg Nebulization BID  . Chlorhexidine Gluconate Cloth  6 each Topical Daily  . feeding supplement (PRO-STAT SUGAR FREE 64)  60 mL Per Tube TID  . insulin aspart  0-20 Units Subcutaneous Q4H  . insulin glargine  26 Units Subcutaneous QHS  . ipratropium-albuterol  3 mL Nebulization Q4H  . multivitamin  15 mL Per Tube Daily  . pantoprazole sodium  40 mg Per Tube Daily   heparin, ipratropium-albuterol, LORazepam, vecuronium  Assessment/ Plan:  84 y.o.  male with a PMHx of Anemia of chronic kidney disease, bilateral lower extremity edema, nephrolithiasis, BPH, chronic kidney disease stage IIIb baseline creatinine 1.6 EGFR 42, coronary artery disease, degenerative disc disease, diabetes  mellitus type 2, chronic diastolic heart failure, GERD, Barrett's esophagus, hyperlipidemia, hypertension, paroxysmal atrial fibrillation, prostate cancer, urethral stricture , who was admitted to [facility] on 12/09/2018 for evaluation of significant shortness of breath.   1.  Acute kidney injury/chronic kidney disease stage IIIb baseline creatinine 1.6 with a EGFR of 42.  Patient continues to have severe renal dysfunction.  Creatinine 4.37 with an EGFR of 12 and urine output only 150 cc over the preceding 24 hours.  He also has underlying lactic and metabolic acidosis.  Therefore we will proceed with CRRT.  Orders have been prepared.  We will plan to use 2.5 L/h of dialysate, 1.5 L/h of prefilter replacement, and 1 L/h of close filter replacement.  2.  Hyperkalemia.  Serum potassium slightly high at 5.4.  4K bath to be used for now.  Follow serum potassium.  3.  Metabolic acidosis.  Serum bicarbonate low at 18.  CRRT should help to treat this.  4.  Acute respiratory failure.  Ventilatory support as per pulmonary/critical care team.   LOS: 2 Debi Cousin 12/16/20209:36 AM

## 2018-12-20 NOTE — Consult Note (Signed)
Chief Complaint: Patient was seen in consultation today for suprapubic catheter placement  Referring Physician(s): Kasa, MD   Patient Status: [facility] - In-pt  History of Present Illness: Carlos Obrien is a 84 y.o. male with multiple medical problems including diabetes, hyperlipidemia, atrial fibrillation, CHF, coronary artery disease and prostate cancer.  Patient is currently in the intensive care unit on a ventilator and being treated for pneumonia and sepsis.  In addition, the patient has acute renal failure and urology was unable to place a Foley catheter yesterday.  Patient has a history of prostate cancer and incontinence.  Yesterday, the bladder was draining with a condom catheter but there has been bleeding from the penis and the condom catheter is no longer present.  Renal ultrasound yesterday did not demonstrate hydronephrosis but there was mild to moderate distention of the urinary bladder.  Suprapubic catheter has been requested.  Past Medical History:  Diagnosis Date  . Anemia    hx  . Bilateral lower extremity edema   . Bladder calculi   . BPH (benign prostatic hyperplasia)   . CKD (chronic kidney disease)   . Coronary artery disease    cardiologist-- dr Loney Laurence [facility])  lov note in care everywhere tab in epic  . DDD (degenerative disc disease), lumbar   . Diabetes mellitus without complication ([place])   . Diastolic CHF, chronic ([place])   . Dyspnea    exersion  . Dysrhythmia    atrial fib  . GERD (gastroesophageal reflux disease)   . History of adenomatous polyp of colon    2012  . History of Barrett's esophagus   . History of basal cell carcinoma excision    Sept 2016  --scalp  . History of colon cancer    1999  adenocarcinoma polyp  . History of hiatal hernia   . History of kidney stones   . History of kidney stones   . History of MI (myocardial infarction)    1996  . Hyperlipidemia   . Hypertension   . PAF (paroxysmal atrial fibrillation) ([place])     dx 2015  . Prostate cancer ([place])   . Urethral stricture   . Urinary incontinence     Past Surgical History:  Procedure Laterality Date  . BLEPHAROPLASTY Bilateral Fall 2015  . CORONARY ANGIOPLASTY  May 1996  dr fath [facility] date verified w/ medical records)   per cardiologist note in epic (dr Loney Laurence)  2 vessel CAD-- occluded RCA and 60% LAD  . CYSTOSCOPY WITH HOLMIUM LASER LITHOTRIPSY N/A 01/08/2015   Procedure: CYSTOSCOPY WITH REMOVAL OF BLADDER STONE;  Surgeon: Rana Snare, MD;  Location: [facility];  Service: Urology;  Laterality: N/A;  . CYSTOSCOPY WITH RETROGRADE PYELOGRAM, URETEROSCOPY AND STENT PLACEMENT  Aug 2015  . CYSTOSCOPY WITH URETHRAL DILATATION N/A 01/08/2015   Procedure: CYSTOSCOPY WITH URETHRAL DILATATION;  Surgeon: Rana Snare, MD;  Location: [facility];  Service: Urology;  Laterality: N/A;  . LUMBAR LAMINECTOMY/DECOMPRESSION MICRODISCECTOMY N/A 02/09/2016   Procedure: Lumbar four  to Sacral one Laminectomy for decompression;  Surgeon: Kevan Ny Ditty, MD;  Location: [place];  Service: Neurosurgery;  Laterality: N/A;  L4 to S1 Laminectomy for decompression  . PARTIAL KNEE ARTHROPLASTY Right 11-08-2012  . TRANSTHORACIC ECHOCARDIOGRAM  10-22-2013   mild concentric LVH,  grade 1 diastolic dysfunction,  ef 50-55%,  mild MR and TR,  trivial PR    Allergies: No known allergies  Medications: Prior to Admission medications   Medication Sig Start  Date End Date Taking? Authorizing Provider  apixaban (ELIQUIS) 5 MG TABS tablet Take 1 tablet (5 mg total) by mouth 2 (two) times daily. 02/13/16   Costella, Vista Mink, PA-C  aspirin 81 MG EC tablet Take 81 mg by mouth daily.     [provider]  atenolol (TENORMIN) 50 MG tablet Take 50 mg by mouth every morning.     [provider]  atorvastatin (LIPITOR) 20 MG tablet Take 20 mg by mouth every evening.     [provider]  Cholecalciferol (VITAMIN D) 2000 units tablet Take  2,000 Units by mouth daily.    [provider]  furosemide (LASIX) 20 MG tablet Take 40 mg by mouth every morning.     [provider]  gabapentin (NEURONTIN) 300 MG capsule Take 100 mg by mouth 3 (three) times daily.     [provider]  glipiZIDE (GLUCOTROL) 5 MG tablet Take 5 mg by mouth 2 (two) times daily.     [provider]  Insulin Glargine (LANTUS SOLOSTAR) 100 UNIT/ML Solostar Pen Inject 26 Units into the skin at bedtime.  03/31/15   [provider]  isosorbide mononitrate (IMDUR) 30 MG 24 hr tablet Take 30 mg by mouth every morning.     [provider]  Liraglutide (VICTOZA) 18 MG/3ML SOPN Inject 1.8 mg into the skin every morning.     [provider]  magnesium oxide (MAG-OX) 400 MG tablet Take 400 mg by mouth every evening.     [provider]  meloxicam (MOBIC) 7.5 MG tablet Take 7.5 mg by mouth daily. 10/04/18   [provider]  metFORMIN (GLUCOPHAGE) 500 MG tablet Take 1,000 mg by mouth 2 (two) times daily with a meal.     [provider]  methocarbamol (ROBAXIN-750) 750 MG tablet Take 1 tablet (750 mg total) by mouth 3 (three) times daily as needed for muscle spasms. 02/10/16   Costella, Vista Mink, PA-C  nitroGLYCERIN (NITROSTAT) 0.4 MG SL tablet Place 0.4 mg under the tongue every 5 (five) minutes as needed for chest pain.    [provider]  omeprazole (PRILOSEC) 40 MG capsule Take 40 mg by mouth every morning.     [provider]  oxyCODONE-acetaminophen (PERCOCET) 7.5-325 MG tablet Take 1-2 tablets by mouth as needed for pain. 02/10/16   Costella, Vista Mink, PA-C  pioglitazone (ACTOS) 30 MG tablet Take 30 mg by mouth every morning.     [provider]  ramipril (ALTACE) 2.5 MG capsule Take 2.5 mg by mouth every morning.     [provider]  silodosin (RAPAFLO) 8 MG CAPS capsule Take 8 mg by mouth daily with breakfast.    [provider]  vitamin B-12  (CYANOCOBALAMIN) 1000 MCG tablet Take 1,000 mcg by mouth daily.    [provider]     History reviewed. No pertinent family history.  Social History   Socioeconomic History  . Marital status: Married    Spouse name: Not on file  . Number of children: Not on file  . Years of education: Not on file  . Highest education level: Not on file  Occupational History  . Not on file  Tobacco Use  . Smoking status: Never Smoker  . Smokeless tobacco: Never Used  Substance and Sexual Activity  . Alcohol use: No    Alcohol/week: 0.0 standard drinks  . Drug use: No  . Sexual activity: Not on file  Other Topics Concern  .  Not on file  Social History Narrative  . Not on file   Social Determinants of Health   Financial Resource Strain:   . Difficulty of Paying Living Expenses: Not on file  Food Insecurity:   . Worried About Charity fundraiser in the Last Year: Not on file  . Ran Out of Food in the Last Year: Not on file  Transportation Needs:   . Lack of Transportation (Medical): Not on file  . Lack of Transportation (Non-Medical): Not on file  Physical Activity:   . Days of Exercise per Week: Not on file  . Minutes of Exercise per Session: Not on file  Stress:   . Feeling of Stress : Not on file  Social Connections:   . Frequency of Communication with Friends and Family: Not on file  . Frequency of Social Gatherings with Friends and Family: Not on file  . Attends Religious Services: Not on file  . Active Member of Clubs or Organizations: Not on file  . Attends Archivist Meetings: Not on file  . Marital Status: Not on file     Review of Systems  Vital Signs: BP 109/73   Pulse 86   Temp 98.6 F (37 C) (Axillary)   Resp 16   Ht 6' 3.98" (1.93 m)   Wt (!) 141.2 kg   SpO2 98%   BMI 37.91 kg/m   Physical Exam Constitutional:      Comments: Sedated and on ventilator  Cardiovascular:     Rate and Rhythm: Normal rate and regular rhythm.  Pulmonary:       Effort: Pulmonary effort is normal.     Breath sounds: Normal breath sounds.  Abdominal:     General: There is distension.     Imaging: DG Chest 1 View  Result Date: 12/19/2018 CLINICAL DATA:  Shortness of breath. EXAM: CHEST  1 VIEW COMPARISON:  Radiograph 12/30/2018 FINDINGS: Lower lung volumes from prior exam. Cardiomegaly is not significantly changed allowing for differences in technique. Bronchovascular crowding the similar interstitial prominence to prior. No confluent airspace disease. No large pleural effusion. No pneumothorax. Unchanged osseous structures. IMPRESSION: 1. Lower lung volumes from prior exam. Cardiomegaly is grossly stable. 2. Bronchovascular crowding secondary to lower lung volumes with grossly unchanged interstitial prominence. Electronically Signed   By: Keith Rake M.D.   On: 12/19/2018 05:12   US RENAL  Result Date: 12/19/2018 CLINICAL DATA:  Acute renal failure, history bladder calculi, BPH, coronary artery disease, CHF, diabetes mellitus, hypertension, prostate cancer EXAM: RENAL / URINARY TRACT ULTRASOUND COMPLETE COMPARISON:  Abdomen ultrasound 08/22/2017 FINDINGS: Right Kidney: Renal measurements: 11.0 x 5.4 x 5.8 cm = volume: 180 mL. Degradation of image quality secondary to body habitus and anasarca. Grossly normal cortical thickness and echogenicity. No definite renal mass or hydronephrosis. Left Kidney: Renal measurements: 11.2 x 4.6 x 4.9 cm = volume: 131 mL. Suboptimal visualization due to body habitus and anasarca. No gross evidence of renal mass or hydronephrosis Bladder: Appears normal for degree of bladder distention. Calculated volume 398 mL. Other: N/A IMPRESSION: Suboptimal visualization of the kidneys due to body habitus and anasarca. No definite renal sonographic abnormalities identified. Electronically Signed   By: Lavonia Dana M.D.   On: 12/19/2018 11:18   DG Chest Port 1 View  Result Date: 12/19/2018 CLINICAL DATA:  Encounter for  central line placement. EXAM: PORTABLE CHEST 1 VIEW COMPARISON:  Chest x-ray 12/19/2018. FINDINGS: Endotracheal tube, left IJ line, NG tube in stable position. Cardiomegaly. Mild  bilateral subsegmental atelectasis and/or infiltrates again noted without interim change. No pleural effusion or pneumothorax. IMPRESSION: 1. Interim placement left IJ line, its tip is over the superior vena cava. Endotracheal tube and NG tube in stable position. 2.  Stable cardiomegaly. 3. Mild bilateral subsegmental atelectasis and or infiltrate again noted without interim change. Electronically Signed   By: Marcello Moores  Register   On: 12/19/2018 09:26   DG Chest Port 1 View  Result Date: 12/19/2018 CLINICAL DATA:  Status post intubation. EXAM: PORTABLE CHEST 1 VIEW COMPARISON:  Same day. FINDINGS: Stable cardiomediastinal silhouette. Endotracheal and nasogastric tubes are in grossly good position. No pneumothorax is noted. No significant pleural effusion is noted. Left upper lobe linear opacities are noted concerning for atelectasis or possibly infiltrates. Bony thorax is unremarkable. IMPRESSION: Endotracheal and nasogastric tubes are in grossly good position. Left upper lobe linear opacities are noted concerning for atelectasis or possibly infiltrates. Follow-up radiographs are recommended. Electronically Signed   By: Marijo Conception M.D.   On: 12/19/2018 08:23   DG Chest Port 1 View  Result Date: 12/11/2018 CLINICAL DATA:  Initial evaluation for acute fever, cough, congestion. EXAM: PORTABLE CHEST 1 VIEW COMPARISON:  Prior CT from 06/20/2017 FINDINGS: Mild cardiomegaly. Mediastinal silhouette within normal limits. Lungs hypoinflated. Mild diffuse interstitial prominence, which could reflect interstitial congestion and/or atypical pneumonitis. No consolidative opacity identified. No frank alveolar edema. No pleural effusion. No pneumothorax. No acute osseous finding. IMPRESSION: 1. Mild diffuse interstitial prominence, which could  reflect interstitial congestion and/or atypical pneumonitis. No consolidative opacity identified. 2. Mild cardiomegaly without frank edema. Electronically Signed   By: Jeannine Boga M.D.   On: 12/15/2018 21:50   DG Abd Portable 1V  Result Date: 12/19/2018 CLINICAL DATA:  NG tube placement EXAM: PORTABLE ABDOMEN - 1 VIEW COMPARISON:  12/19/2018 FINDINGS: NG tube projects over the mid stomach body. Mild gaseous distention of the colon and small bowel in the left abdomen. No definite obstruction pattern. No abnormal calcifications. Degenerative changes of the spine. IMPRESSION: NG tube within the mid stomach body.  Nonspecific bowel gas pattern. Electronically Signed   By: Jerilynn Mages.  Shick M.D.   On: 12/19/2018 08:23    Labs:  CBC: Recent Labs    12/31/2018 2129 12/18/18 0848 12/19/18 0422 12/20/18 0420  WBC 4.5 8.3 8.7 19.2*  HGB 10.3* 10.9* 9.8* 9.7*  HCT 31.8* 33.1* 31.2* 31.0*  PLT 120* 141* 122* 159    COAGS: Recent Labs    12/08/2018 2129 12/19/18 1214 12/19/18 1952 12/20/18 0420 12/20/18 0450  INR 1.7* 1.8*  --   --  2.0*  APTT 35  --  >160* >160*  --     BMP: Recent Labs    12/18/18 1451 12/18/18 1914 12/19/18 0422 12/20/18 0420  NA 134* 133* 137 136  K 5.2* 4.8 4.9 5.4*  CL 105 104 105 105  CO2 19* 18* 21* 18*  GLUCOSE 209* 218* 177* 211*  BUN 44* 52* 55* 74*  CALCIUM 8.5* 8.1* 8.4* 7.5*  CREATININE 2.64* 3.07* 2.98* 4.37*  GFRNONAA 22* 18* 19* 12*  GFRAA 25* 21* 22* 14*    LIVER FUNCTION TESTS: Recent Labs    12/29/2018 2129  BILITOT 0.9  AST 47*  ALT 25  ALKPHOS 76  PROT 6.6  ALBUMIN 3.5    TUMOR MARKERS: No results for input(s): AFPTM, CEA, CA199, CHROMGRNA in the last 8760 hours.  Assessment and Plan:   84 year old critically ill patient in the intensive care unit with pneumonia, sepsis  and acute renal failure. Unable to place Foley catheter due to urethral stricture.  Condom catheter does not appear to be adequately decompressing the urinary  bladder and patient needs a suprapubic catheter.  I performed ultrasound at the bedside this morning and confirmed that the patient has a moderate amount of fluid within the urinary bladder and should be amendable to ultrasound-guided catheter placement.  Patient has been on anticoagulation for atrial fibrillation and reportedly was taken off his Eliquis approximately 2 days ago.  IV heparin has been stopped.  Patient received vitamin K yesterday but INR remains elevated at 2.0.  Discussed INR level with Dr. Braxton Feathers and FFP has been ordered.  I discussed suprapubic catheter placement with the patient's wife yesterday and informed consent was obtained.  Plan for ultrasound-guided placement of the suprapubic catheter at the bedside.  Thank you for this interesting consult.  I greatly enjoyed meeting Carlos Obrien and look forward to participating in their care.  A copy of this report was sent to the requesting provider on this date.  Electronically Signed: Burman Riis, MD 12/20/2018, 9:21 AM   I spent a total of 20 Minutes    in face to face in clinical consultation, greater than 50% of which was counseling/coordinating care for image guided suprapubic catheter placement.

## 2018-12-20 NOTE — Progress Notes (Addendum)
Shift summary:  - 1 unit FFP initiated at 1022 hrs, completed at 1030 hrs - Suprapubic cath placed by IR at bedside.  - CRRT initiated at 1253 hrs.

## 2018-12-20 NOTE — Plan of Care (Signed)

## 2018-12-20 NOTE — Progress Notes (Signed)
During pts bath when laying pt back, penis started to ooze blood. Notified NP Darlyn Chamber he came and assessed pt. And added more labs on to labs that had just been drawn. We cleaned pt and applied towel to groin area.

## 2018-12-20 NOTE — Consult Note (Addendum)
ANTICOAGULATION CONSULT NOTE - Initial Consult  Pharmacy Consult for Heparin Indication: atrial fibrillation  Allergies  Allergen Reactions  . No Known Allergies     Patient Measurements: Height: 6' 3.98" (193 cm) Weight: (!) 311 lb 4.6 oz (141.2 kg) IBW/kg (Calculated) : 86.76 Heparin Dosing Weight: 116.6 kg  Vital Signs: Temp: 98.8 F (37.1 C) (12/16 0346) Temp Source: Oral (12/16 0346) BP: 111/76 (12/16 0600) Pulse Rate: 83 (12/16 0600)  Labs: Recent Labs    12/22/2018 2129 12/18/18 0848 12/18/18 0848 12/18/18 1115 12/18/18 1914 12/19/18 0422 12/19/18 0442 12/19/18 0843 12/19/18 1214 12/19/18 1952 12/20/18 0420 12/20/18 0450  HGB 10.3* 10.9*  --   --   --  9.8*  --   --   --   --  9.7*  --   HCT 31.8* 33.1*  --   --   --  31.2*  --   --   --   --  31.0*  --   PLT 120* 141*  --   --   --  122*  --   --   --   --  159  --   APTT 35  --   --   --   --   --   --   --   --  >160* >160*  --   LABPROT 19.5*  --   --   --   --   --   --   --  21.0*  --   --  22.6*  INR 1.7*  --   --   --   --   --   --   --  1.8*  --   --  2.0*  HEPARINUNFRC  --   --   --   --   --   --   --   --   --   --  >3.60*  --   CREATININE 2.55* 2.35*   < >  --  3.07* 2.98*  --   --   --   --  4.37*  --   TROPONINIHS 6 133*  --  340*  --   --  594* 727*  --   --   --   --    < > = values in this interval not displayed.    Estimated Creatinine Clearance: 20 mL/min (A) (by C-G formula based on SCr of 4.37 mg/dL (H)).   Medical History: Past Medical History:  Diagnosis Date  . Anemia    hx  . Bilateral lower extremity edema   . Bladder calculi   . BPH (benign prostatic hyperplasia)   . CKD (chronic kidney disease)   . Coronary artery disease    cardiologist-- dr Loney Laurence [facility] clinic)  lov note in care everywhere tab in epic  . DDD (degenerative disc disease), lumbar   . Diabetes mellitus without complication ([place])   . Diastolic CHF, chronic ([place])   . Dyspnea    exersion  .  Dysrhythmia    atrial fib  . GERD (gastroesophageal reflux disease)   . History of adenomatous polyp of colon    2012  . History of Barrett's esophagus   . History of basal cell carcinoma excision    Sept 2016  --scalp  . History of colon cancer    1999  adenocarcinoma polyp  . History of hiatal hernia   . History of kidney stones   . History of kidney stones   . History of  MI (myocardial infarction)    1996  . Hyperlipidemia   . Hypertension   . PAF (paroxysmal atrial fibrillation) ([place])    dx 2015  . Prostate cancer ([place])   . Urethral stricture   . Urinary incontinence      Assessment: 84 year-old male admitted for sepsis and pneumonia.  He has a medical history significant ofDM with hyperlipidemia,a. Fib (Eliquis), h/o CHF, h/o prostate cancer, h/o CAD.  Patient's last dose of Eliquis was 12/14 @ 2121.  Hemoglobin is 9.8 and platelets are 122; both are trending down.    12/15 Heparin infusion started @ 1600 units/hr 12/15 @ 1952 aPTT: >160- Infusion decreased to 1500 units/hr  Goal of Therapy:  Heparin level 0.3-0.7 units/ml Monitor platelets by anticoagulation protocol: Yes   Plan:  12/16 @ 0420 aPTT: >160, HL: >3.60 Will hold heparin infusion for 1 hour.  Restart heparin infusion @ 1100 units/hr Recheck aPTT level 6 hours after rate change   Will continue to  follow aPTT until HL and aPTT correlate. Follow CBC with AM labs per protocol.   Pernell Dupre, PharmD, BCPS Clinical Pharmacist 12/20/2018 7:24 AM

## 2018-12-20 NOTE — Consult Note (Signed)
PHARMACY CONSULT NOTE - FOLLOW UP  Pharmacy Consult for Electrolyte Monitoring and Replacement   Recent Labs: Potassium (mmol/L)  Date Value  12/20/2018 5.4 (H)  08/27/2011 4.6   Magnesium (mg/dL)  Date Value  12/20/2018 2.2   Calcium (mg/dL)  Date Value  12/20/2018 7.5 (L)   Calcium, Total (mg/dL)  Date Value  08/27/2011 8.7   Albumin (g/dL)  Date Value  12/22/2018 3.5   Phosphorus (mg/dL)  Date Value  12/20/2018 5.0 (H)   Sodium (mmol/L)  Date Value  12/20/2018 136  08/27/2011 6    84 year old male patient admitted with pneumonia and hypoxic respiratory failure, and remains intubated in the ICU.  He has a medical history significant forDM with hyperlipidemia, atrial fibrillation, h/o CHF, h/o prostate cancer, h/o CAD.  Pt was started on amiodarone drip for atrial fibrillation with RVR with likely underlying diastolic dysfunction.  CRRT will begin in the setting of progressive renal failure.        Electrolytes Goal:  Electrolytes wnl Electrolytes with AM labs.  Glucose BG trending down from the 200s.  FBG 154 this AM. Patient received methylpredinosolone x 2 doses, stopped 12/15. Current regimen: --SSI 0-20 q4 hour --Lantus 26 units QHS Last 24h SSI use:  44 units Diet:  Vital and Prostat  Constipation Last BM recorded on 12/14 Current regimen:  Scheduled Miralax, Senokot-S.  Gerald Dexter, PharmD Pharmacy Resident  12/20/2018 1:03 PM

## 2018-12-20 NOTE — Progress Notes (Addendum)
CRITICAL CARE NOTE   SYNOPSIS 84 y.o.malewith medical history significant ofDM with hyperlipidemia,a. Fib, h/o CHF, h/o prostate cancer, h/o CAD. He reports that for 1-2 days he has been feeling sick with weakness, diffuse myalgias and decreased PO intake. His wife is at home and has been diagnosed with pneumonia. He reports he has been taking his temperature at home and reports it has been normal. He has been treated for UTI 2/2 urinary frequency. Because of progressive weakness he presents to [facility] for evalaution.   ED Course:In the ED he is febrile to 101.6 F, tachycardic to 120-130, mildly hypertensice. Code sepsis initiated: blood cultures drawn, abx with Vanco, cefipieme and Flagyl. IV fluid bolus administered. Labs were remarkable for Lactic acid 4.6, Cr 2.55, normal WBC 4.5 with a normal diff, normal procalcitonin. CXR with mild interstial prominence w/o pul edema or frank infiltrate. Covid rapid Ag test negative, PCR covid testing NEG   CC  follow up respiratory failure  SUBJECTIVE Patient remains critically ill Prognosis is guarded Progressive renal failure On vasopressors  BP 106/74   Pulse 82   Temp 98.8 F (37.1 C) (Oral)   Resp 16   Ht 6' 3.98" (1.93 m)   Wt (!) 141.2 kg   SpO2 99%   BMI 37.91 kg/m    I/O last 3 completed shifts: In: 6581.8 [I.V.:5731.8; IV Piggyback:850] Out: 275 [Urine:275] No intake/output data recorded.  SpO2: 99 % O2 Flow Rate (L/min): 35 L/min FiO2 (%): 40 %   SIGNIFICANT EVENTS 12/14 Patient was transferred to ICU for increased WOB and fevers and sepsis HR 170, started on amiodarone 12/15 Intubated, sedated, VASC cath placed, mulitorgan failure,  Unable to place foley catheter by Urology 12/16 plan for IR suprapubic catheter placement, remains on vent, severe shock, progressive renal failure   REVIEW OF SYSTEMS  PATIENT IS UNABLE TO PROVIDE COMPLETE REVIEW OF SYSTEMS DUE TO SEVERE CRITICAL ILLNESS   PHYSICAL  EXAMINATION:  GENERAL:critically ill appearing, +resp distress HEAD: Normocephalic, atraumatic.  EYES: Pupils equal, round, reactive to light.  No scleral icterus.  MOUTH: Moist mucosal membrane. NECK: Supple.  PULMONARY: +rhonchi, +wheezing CARDIOVASCULAR: S1 and S2. Regular rate and rhythm. No murmurs, rubs, or gallops.  GASTROINTESTINAL: Soft, nontender, -distended. No masses. Positive bowel sounds. No hepatosplenomegaly.  MUSCULOSKELETAL: No swelling, clubbing, or edema.  NEUROLOGIC: obtunded, GCS<8 SKIN:intact,warm,dry  MEDICATIONS: I have reviewed all medications and confirmed regimen as documented   CULTURE RESULTS   Recent Results (from the past 240 hour(s))  Blood Culture (routine x 2)     Status: None (Preliminary result)   Collection Time: 12/10/2018  9:20 PM   Specimen: BLOOD  Result Value Ref Range Status   Specimen Description BLOOD RIGHT HAND  Final   Special Requests   Final    BOTTLES DRAWN AEROBIC AND ANAEROBIC Blood Culture adequate volume   Culture   Final    NO GROWTH 3 DAYS Performed at [facility], [address], [place], [place] [address]    Report Status PENDING  Incomplete  Blood Culture (routine x 2)     Status: None (Preliminary result)   Collection Time: 12/23/2018  9:29 PM   Specimen: BLOOD  Result Value Ref Range Status   Specimen Description BLOOD RIGHT ANTECUBITAL  Final   Special Requests   Final    BOTTLES DRAWN AEROBIC AND ANAEROBIC Blood Culture adequate volume   Culture   Final    NO GROWTH 3 DAYS Performed at [facility], [address] [place], [place] [address]    Report Status PENDING  Incomplete  Respiratory Panel by RT PCR (Flu A&B, Covid) -     Status: None   Collection Time: 12/20/2018 11:41 PM  Result Value Ref Range Status   SARS Coronavirus 2 by RT PCR NEGATIVE NEGATIVE Final    Comment: (NOTE) SARS-CoV-2 target nucleic acids are NOT DETECTED. The SARS-CoV-2 RNA is generally detectable in upper  respiratoy specimens during the acute phase of infection. The lowest concentration of SARS-CoV-2 viral copies this assay can detect is 131 copies/mL. A negative result does not preclude SARS-Cov-2 infection and should not be used as the sole basis for treatment or other patient management decisions. A negative result may occur with  improper specimen collection/handling, submission of specimen other than nasopharyngeal swab, presence of viral mutation(s) within the areas targeted by this assay, and inadequate number of viral copies (<131 copies/mL). A negative result must be combined with clinical observations, patient history, and epidemiological information. The expected result is Negative. Fact Sheet for Patients:  PinkCheek.be Fact Sheet for Healthcare Providers:  GravelBags.it This test is not yet ap proved or cleared by the [place] FDA and  has been authorized for detection and/or diagnosis of SARS-CoV-2 by FDA under an Emergency Use Authorization (EUA). This EUA will remain  in effect (meaning this test can be used) for the duration of the COVID-19 declaration under Section 564(b)(1) of the Act, 21 U.S.C. section 360bbb-3(b)(1), unless the authorization is terminated or revoked sooner.    Influenza A by PCR NEGATIVE NEGATIVE Final   Influenza B by PCR NEGATIVE NEGATIVE Final    Comment: (NOTE) The Xpert Xpress SARS-CoV-2/FLU/RSV assay is intended as an aid in  the diagnosis of influenza from Nasopharyngeal swab specimens and  should not be used as a sole basis for treatment. Nasal washings and  aspirates are unacceptable for Xpert Xpress SARS-CoV-2/FLU/RSV  testing. Fact Sheet for Patients: PinkCheek.be Fact Sheet for Healthcare Providers: GravelBags.it This test is not yet approved or cleared by the [place] FDA and  has been authorized for  detection and/or diagnosis of SARS-CoV-2 by  FDA under an Emergency Use Authorization (EUA). This EUA will remain  in effect (meaning this test can be used) for the duration of the  Covid-19 declaration under Section 564(b)(1) of the Act, 21  U.S.C. section 360bbb-3(b)(1), unless the authorization is  terminated or revoked. Performed at [facility], [address], [place], [place] [address]   Urine culture     Status: None   Collection Time: 12/18/18  4:45 AM   Specimen: Urine, Random  Result Value Ref Range Status   Specimen Description   Final    URINE, RANDOM Performed at [facility], [address], [place], [place] [address]    Special Requests   Final    NONE Performed at [facility], [address], [place], [place] [address]    Culture   Final    NO GROWTH Performed at [facility], [place] [address] [place], [place] [address]    Report Status 12/19/2018 FINAL  Final  CULTURE, BLOOD (ROUTINE X 2) w Reflex to ID Panel     Status: None (Preliminary result)   Collection Time: 12/18/18  8:40 AM   Specimen: BLOOD  Result Value Ref Range Status   Specimen Description BLOOD BLOOD RIGHT FOREARM  Final   Special Requests   Final    BOTTLES DRAWN AEROBIC AND ANAEROBIC Blood Culture  results may not be optimal due to an inadequate volume of blood received in culture bottles   Culture   Final    NO GROWTH 2 DAYS Performed at [facility], [address] [place], [place] [address]    Report Status PENDING  Incomplete  CULTURE, BLOOD (ROUTINE X 2) w Reflex to ID Panel     Status: None (Preliminary result)   Collection Time: 12/18/18  9:02 AM   Specimen: BLOOD  Result Value Ref Range Status   Specimen Description BLOOD BLOOD LEFT ARM  Final   Special Requests   Final    BOTTLES DRAWN AEROBIC AND ANAEROBIC Blood Culture adequate volume   Culture   Final    NO GROWTH 2 DAYS Performed at [facility], [address],  [place], [place] [address]    Report Status PENDING  Incomplete  MRSA PCR Screening     Status: None   Collection Time: 12/18/18  9:14 AM   Specimen: Nasal Mucosa; Nasopharyngeal  Result Value Ref Range Status   MRSA by PCR NEGATIVE NEGATIVE Final    Comment:        The GeneXpert MRSA Assay (FDA approved for NASAL specimens only), is one component of a comprehensive MRSA colonization surveillance program. It is not intended to diagnose MRSA infection nor to guide or monitor treatment for MRSA infections. Performed at [facility], [address] [place], [place] [address]           IMAGING    US RENAL  Result Date: 12/19/2018 CLINICAL DATA:  Acute renal failure, history bladder calculi, BPH, coronary artery disease, CHF, diabetes mellitus, hypertension, prostate cancer EXAM: RENAL / URINARY TRACT ULTRASOUND COMPLETE COMPARISON:  Abdomen ultrasound 08/22/2017 FINDINGS: Right Kidney: Renal measurements: 11.0 x 5.4 x 5.8 cm = volume: 180 mL. Degradation of image quality secondary to body habitus and anasarca. Grossly normal cortical thickness and echogenicity. No definite renal mass or hydronephrosis. Left Kidney: Renal measurements: 11.2 x 4.6 x 4.9 cm = volume: 131 mL. Suboptimal visualization due to body habitus and anasarca. No gross evidence of renal mass or hydronephrosis Bladder: Appears normal for degree of bladder distention. Calculated volume 398 mL. Other: N/A IMPRESSION: Suboptimal visualization of the kidneys due to body habitus and anasarca. No definite renal sonographic abnormalities identified. Electronically Signed   By: Lavonia Dana M.D.   On: 12/19/2018 11:18   DG Chest Port 1 View  Result Date: 12/19/2018 CLINICAL DATA:  Encounter for central line placement. EXAM: PORTABLE CHEST 1 VIEW COMPARISON:  Chest x-ray 12/19/2018. FINDINGS: Endotracheal tube, left IJ line, NG tube in stable position. Cardiomegaly. Mild bilateral subsegmental atelectasis and/or  infiltrates again noted without interim change. No pleural effusion or pneumothorax. IMPRESSION: 1. Interim placement left IJ line, its tip is over the superior vena cava. Endotracheal tube and NG tube in stable position. 2.  Stable cardiomegaly. 3. Mild bilateral subsegmental atelectasis and or infiltrate again noted without interim change. Electronically Signed   By: Marcello Moores  Register   On: 12/19/2018 09:26   DG Chest Port 1 View  Result Date: 12/19/2018 CLINICAL DATA:  Status post intubation. EXAM: PORTABLE CHEST 1 VIEW COMPARISON:  Same day. FINDINGS: Stable cardiomediastinal silhouette. Endotracheal and nasogastric tubes are in grossly good position. No pneumothorax is noted. No significant pleural effusion is noted. Left upper lobe linear opacities are noted concerning for atelectasis or possibly infiltrates. Bony thorax is unremarkable. IMPRESSION: Endotracheal and nasogastric tubes are in grossly good position. Left upper  lobe linear opacities are noted concerning for atelectasis or possibly infiltrates. Follow-up radiographs are recommended. Electronically Signed   By: Marijo Conception M.D.   On: 12/19/2018 08:23   DG Abd Portable 1V  Result Date: 12/19/2018 CLINICAL DATA:  NG tube placement EXAM: PORTABLE ABDOMEN - 1 VIEW COMPARISON:  12/19/2018 FINDINGS: NG tube projects over the mid stomach body. Mild gaseous distention of the colon and small bowel in the left abdomen. No definite obstruction pattern. No abnormal calcifications. Degenerative changes of the spine. IMPRESSION: NG tube within the mid stomach body.  Nonspecific bowel gas pattern. Electronically Signed   By: Jerilynn Mages.  Shick M.D.   On: 12/19/2018 08:23       Indwelling Urinary Catheter continued, requirement due to   Reason to continue Indwelling Urinary Catheter strict Intake/Output monitoring for hemodynamic instability   Central Line/ continued, requirement due to  Reason to continue [place] of central venous  pressure or other hemodynamic parameters and poor IV access   Ventilator continued, requirement due to severe respiratory failure   Ventilator Sedation RASS 0 to -2      ASSESSMENT AND PLAN SYNOPSIS  SEVERE SEPSIS WITH PNEUMONIA WITH SEVERE LACTIC ACIDOSIS WITH PROGRESSIVE ADN SEVERE HYPOXIC RESP FAILURE  WITH PROGRESSIVE RENAL FAILURE WITH PROBABLE UNDERLYING DIASTOLIC CARDIAC DYSFUNCTION WITH AFIB WITH RVR    Severe ACUTE Hypoxic and Hypercapnic Respiratory Failure -continue Full MV support -continue Bronchodilator Therapy -Wean Fio2 and PEEP as tolerated   ACUTE DIASTOLIC CARDIAC FAILURE-  VENT SUPPORT AFIB WITH RVR ON AMIODARONE    ACUTE KIDNEY INJURY/Renal Failure -follow chem 7 -follow UO -continue Foley Catheter-assess need -Avoid nephrotoxic agents -Recheck creatinine  TO BE STARTED ON CRRT PLAN FOR SUPRAPUBIC CATHETER BY IR   NEUROLOGY - intubated and sedated - minimal sedation to achieve a RASS goal: -1   SHOCK-SEPSIS/HYPOVOLUMIC/CARDIOGENIC -use vasopressors to keep MAP>65 -follow ABG and LA -follow up cultures -consider stress dose steroids   CARDIAC ICU monitoring  ID -continue IV abx as prescibed -follow up cultures STREP PYOGENES BACTEREMIA ID CONSULTAION  GI GI PROPHYLAXIS as indicated  NUTRITIONAL STATUS DIET-->TF's as tolerated Constipation protocol as indicated  ENDO - will use ICU hypoglycemic\Hyperglycemia protocol if indicated   ELECTROLYTES -follow labs as needed -replace as needed -pharmacy consultation and following   DVT/GI PRX ordered TRANSFUSIONS AS NEEDED MONITOR FSBS ASSESS the need for LABS as needed   Critical Care Time devoted to patient care services described in this note is 35 minutes.   Overall, patient is critically ill, prognosis is guarded.  Patient with Multiorgan failure and at high risk for cardiac arrest and death.    Corrin Parker, M.D.  [facility]   Medical Director [place] Director [facility] Cardio-Pulmonary Department

## 2018-12-21 LAB — RENAL FUNCTION PANEL
Albumin: 2.7 g/dL — ABNORMAL LOW (ref 3.5–5.0)
Albumin: 2.6 g/dL — ABNORMAL LOW (ref 3.5–5.0)
Anion gap: 9 (ref 5–15)
Anion gap: 9 (ref 5–15)
BUN: 43 mg/dL — ABNORMAL HIGH (ref 8–23)
BUN: 40 mg/dL — ABNORMAL HIGH (ref 8–23)
CO2: 22 mmol/L (ref 22–32)
CO2: 24 mmol/L (ref 22–32)
Calcium: 7.7 mg/dL — ABNORMAL LOW (ref 8.9–10.3)
Calcium: 7.9 mg/dL — ABNORMAL LOW (ref 8.9–10.3)
Chloride: 103 mmol/L (ref 98–111)
Chloride: 103 mmol/L (ref 98–111)
Creatinine, Ser: 2.51 mg/dL — ABNORMAL HIGH (ref 0.61–1.24)
Creatinine, Ser: 2.4 mg/dL — ABNORMAL HIGH (ref 0.61–1.24)
GFR calc Af Amer: 27 mL/min — ABNORMAL LOW (ref >60)
GFR calc Af Amer: 28 mL/min — ABNORMAL LOW (ref >60)
GFR calc non Af Amer: 23 mL/min — ABNORMAL LOW (ref >60)
GFR calc non Af Amer: 24 mL/min — ABNORMAL LOW (ref >60)
Glucose, Bld: 261 mg/dL — ABNORMAL HIGH (ref 70–99)
Glucose, Bld: 259 mg/dL — ABNORMAL HIGH (ref 70–99)
Phosphorus: 3.3 mg/dL (ref 2.5–4.6)
Phosphorus: 2.8 mg/dL (ref 2.5–4.6)
Potassium: 5.3 mmol/L — ABNORMAL HIGH (ref 3.5–5.1)
Potassium: 5 mmol/L (ref 3.5–5.1)
Sodium: 134 mmol/L — ABNORMAL LOW (ref 135–145)
Sodium: 136 mmol/L (ref 135–145)

## 2018-12-21 LAB — CBC WITH DIFFERENTIAL/PLATELET
Abs Immature Granulocytes: 1.01 10*3/uL — ABNORMAL HIGH (ref 0.00–0.07)
Basophils Absolute: 0 10*3/uL (ref 0.0–0.1)
Basophils Relative: 0 %
Eosinophils Absolute: 0.3 10*3/uL (ref 0.0–0.5)
Eosinophils Relative: 3 %
HCT: 28.3 % — ABNORMAL LOW (ref 39.0–52.0)
Hemoglobin: 8.7 g/dL — ABNORMAL LOW (ref 13.0–17.0)
Immature Granulocytes: 10 %
Lymphocytes Relative: 10 %
Lymphs Abs: 1 10*3/uL (ref 0.7–4.0)
MCH: 30.3 pg (ref 26.0–34.0)
MCHC: 30.7 g/dL (ref 30.0–36.0)
MCV: 98.6 fL (ref 80.0–100.0)
Monocytes Absolute: 1 10*3/uL (ref 0.1–1.0)
Monocytes Relative: 10 %
Neutro Abs: 6.8 10*3/uL (ref 1.7–7.7)
Neutrophils Relative %: 67 %
Platelets: 118 10*3/uL — ABNORMAL LOW (ref 150–400)
RBC: 2.87 MIL/uL — ABNORMAL LOW (ref 4.22–5.81)
RDW: 21.6 % — ABNORMAL HIGH (ref 11.5–15.5)
Smear Review: NORMAL
WBC: 10.1 10*3/uL (ref 4.0–10.5)
nRBC: 0.5 % — ABNORMAL HIGH (ref 0.0–0.2)

## 2018-12-21 LAB — ENA+DNA/DS+ANTICH+CENTRO+JO...
Anti JO-1: <0.2 AI (ref 0.0–0.9)
Centromere Ab Screen: <0.2 AI (ref 0.0–0.9)
Chromatin Ab SerPl-aCnc: <0.2 AI (ref 0.0–0.9)
ENA SM Ab Ser-aCnc: <0.2 AI (ref 0.0–0.9)
Ribonucleic Protein: <0.2 AI (ref 0.0–0.9)
SSA (Ro) (ENA) Antibody, IgG: <0.2 AI (ref 0.0–0.9)
SSB (La) (ENA) Antibody, IgG: <0.2 AI (ref 0.0–0.9)
Scleroderma (Scl-70) (ENA) Antibody, IgG: <0.2 AI (ref 0.0–0.9)
ds DNA Ab: 28 IU/mL — ABNORMAL HIGH (ref 0–9)

## 2018-12-21 LAB — PREPARE FRESH FROZEN PLASMA: Unit division: 0

## 2018-12-21 LAB — BPAM FFP
Blood Product Expiration Date: 202012212359
ISSUE DATE / TIME: 202012161012
Unit Type and Rh: 6200

## 2018-12-21 LAB — PROTEIN ELECTRO, RANDOM URINE
Albumin ELP, Urine: 46.1 %
Alpha-1-Globulin, U: 3.5 %
Alpha-2-Globulin, U: 5 %
Beta Globulin, U: 17.4 %
Gamma Globulin, U: 28 %
Total Protein, Urine: 43.8 mg/dL

## 2018-12-21 LAB — GLUCOSE, CAPILLARY
Glucose-Capillary: 235 mg/dL — ABNORMAL HIGH (ref 70–99)
Glucose-Capillary: 261 mg/dL — ABNORMAL HIGH (ref 70–99)
Glucose-Capillary: 230 mg/dL — ABNORMAL HIGH (ref 70–99)
Glucose-Capillary: 252 mg/dL — ABNORMAL HIGH (ref 70–99)
Glucose-Capillary: 245 mg/dL — ABNORMAL HIGH (ref 70–99)

## 2018-12-21 LAB — ANA W/REFLEX IF POSITIVE: Anti Nuclear Antibody (ANA): POSITIVE — AB

## 2018-12-21 LAB — MAGNESIUM: Magnesium: 2.3 mg/dL (ref 1.7–2.4)

## 2018-12-21 MED ORDER — DEXTROSE 50 % IV SOLN: 12.5000 g | INTRAVENOUS | Status: DC | PRN

## 2018-12-21 MED ORDER — INSULIN ASPART 100 UNIT/ML ~~LOC~~ SOLN
3.0000 [IU] | SUBCUTANEOUS | Status: DC
  Administered 2018-12-21 – 2018-12-22 (×7): 3 [IU] via SUBCUTANEOUS
  Filled 2018-12-21 (×5): qty 1

## 2018-12-21 NOTE — Progress Notes (Addendum)
CRITICAL CARE NOTE SYNOPSIS 84 y.o.malewith medical history significant ofDM with hyperlipidemia,a. Fib, h/o CHF, h/o prostate cancer, h/o CAD. He reports that for 1-2 days he has been feeling sick with weakness, diffuse myalgias and decreased PO intake. His wife is at home and has been diagnosed with pneumonia. He reports he has been taking his temperature at home and reports it has been normal. He has been treated for UTI 2/2 urinary frequency. Because of progressive weakness he presents to [facility] for evalaution.   ED Course:In the ED he is febrile to 101.6 F, tachycardic to 120-130, mildly hypertensice. Code sepsis initiated: blood cultures drawn, abx with Vanco, cefipieme and Flagyl. IV fluid bolus administered. Labs were remarkable for Lactic acid 4.6, Cr 2.55, normal WBC 4.5 with a normal diff, normal procalcitonin. CXR with mild interstial prominence w/o pul edema or frank infiltrate. Covid rapid Ag test negative, PCR covid testing NEG   CC  follow up respiratory failure  SUBJECTIVE Patient remains critically ill Prognosis is guarded Remains on vent, on CRRT   BP (!) 101/53   Pulse 93   Temp 99.4 F (37.4 C)   Resp 16   Ht 6' 3.98" (1.93 m)   Wt (!) 141.2 kg   SpO2 100%   BMI 37.91 kg/m    I/O last 3 completed shifts: In: 5295.9 [I.V.:3330; Other:460; NG/GT:1305.8; IV Piggyback:200.1] Out: 2714 [Urine:630; LDJTT:0177; Blood:13] Total I/O In: -  Out: 100 [Other:100]  SpO2: 100 % O2 Flow Rate (L/min): 35 L/min FiO2 (%): (S) 30 %   SIGNIFICANT EVENTS 12/14 Patient was transferred to ICU for increased WOB and fevers and sepsis HR 170, started on amiodarone 12/15 Intubated, sedated, VASC cath placed, mulitorgan failure, Unable to place foley catheter by Urology 12/16 plan for IR suprapubic catheter placement, remains on vent, severe shock, progressive renal failure  REVIEW OF SYSTEMS  PATIENT IS UNABLE TO PROVIDE COMPLETE REVIEW OF SYSTEMS DUE TO SEVERE  CRITICAL ILLNESS   PHYSICAL EXAMINATION:  GENERAL:critically ill appearing, +resp distress HEAD: Normocephalic, atraumatic.  EYES: Pupils equal, round, reactive to light.  No scleral icterus.  MOUTH: Moist mucosal membrane. NECK: Supple.  PULMONARY: +rhonchi, +wheezing CARDIOVASCULAR: S1 and S2. Regular rate and rhythm. No murmurs, rubs, or gallops.  GASTROINTESTINAL: Soft, nontender, -distended. No masses. Positive bowel sounds. No hepatosplenomegaly.  MUSCULOSKELETAL: No swelling, clubbing, or edema.  NEUROLOGIC: obtunded, GCS<8 SKIN:intact,warm,dry  MEDICATIONS: I have reviewed all medications and confirmed regimen as documented   CULTURE RESULTS   Recent Results (from the past 240 hour(s))  Blood Culture (routine x 2)     Status: None (Preliminary result)   Collection Time: 12/30/2018  9:20 PM   Specimen: BLOOD  Result Value Ref Range Status   Specimen Description BLOOD RIGHT HAND  Final   Special Requests   Final    BOTTLES DRAWN AEROBIC AND ANAEROBIC Blood Culture adequate volume   Culture   Final    NO GROWTH 3 DAYS Performed at [facility], [address], [place], [place] [address]    Report Status PENDING  Incomplete  Blood Culture (routine x 2)     Status: None (Preliminary result)   Collection Time: 12/14/2018  9:29 PM   Specimen: BLOOD  Result Value Ref Range Status   Specimen Description BLOOD RIGHT ANTECUBITAL  Final   Special Requests   Final    BOTTLES DRAWN AEROBIC AND ANAEROBIC Blood Culture adequate volume   Culture   Final    NO GROWTH 3 DAYS Performed  at [facility], [address], [place], [place] [address]    Report Status PENDING  Incomplete  Respiratory Panel by RT PCR (Flu A&B, Covid) -     Status: None   Collection Time: 12/14/2018 11:41 PM  Result Value Ref Range Status   SARS Coronavirus 2 by RT PCR NEGATIVE NEGATIVE Final    Comment: (NOTE) SARS-CoV-2 target nucleic acids are NOT DETECTED. The SARS-CoV-2 RNA is  generally detectable in upper respiratoy specimens during the acute phase of infection. The lowest concentration of SARS-CoV-2 viral copies this assay can detect is 131 copies/mL. A negative result does not preclude SARS-Cov-2 infection and should not be used as the sole basis for treatment or other patient management decisions. A negative result may occur with  improper specimen collection/handling, submission of specimen other than nasopharyngeal swab, presence of viral mutation(s) within the areas targeted by this assay, and inadequate number of viral copies (<131 copies/mL). A negative result must be combined with clinical observations, patient history, and epidemiological information. The expected result is Negative. Fact Sheet for Patients:  PinkCheek.be Fact Sheet for Healthcare Providers:  GravelBags.it This test is not yet ap proved or cleared by the [place] FDA and  has been authorized for detection and/or diagnosis of SARS-CoV-2 by FDA under an Emergency Use Authorization (EUA). This EUA will remain  in effect (meaning this test can be used) for the duration of the COVID-19 declaration under Section 564(b)(1) of the Act, 21 U.S.C. section 360bbb-3(b)(1), unless the authorization is terminated or revoked sooner.    Influenza A by PCR NEGATIVE NEGATIVE Final   Influenza B by PCR NEGATIVE NEGATIVE Final    Comment: (NOTE) The Xpert Xpress SARS-CoV-2/FLU/RSV assay is intended as an aid in  the diagnosis of influenza from Nasopharyngeal swab specimens and  should not be used as a sole basis for treatment. Nasal washings and  aspirates are unacceptable for Xpert Xpress SARS-CoV-2/FLU/RSV  testing. Fact Sheet for Patients: PinkCheek.be Fact Sheet for Healthcare Providers: GravelBags.it This test is not yet approved or cleared by the [place] FDA and   has been authorized for detection and/or diagnosis of SARS-CoV-2 by  FDA under an Emergency Use Authorization (EUA). This EUA will remain  in effect (meaning this test can be used) for the duration of the  Covid-19 declaration under Section 564(b)(1) of the Act, 21  U.S.C. section 360bbb-3(b)(1), unless the authorization is  terminated or revoked. Performed at [facility], [address] [place], [place] [address]   Urine culture     Status: None   Collection Time: 12/18/18  4:45 AM   Specimen: Urine, Random  Result Value Ref Range Status   Specimen Description   Final    URINE, RANDOM Performed at [facility], [address] [place], [place] [address]    Special Requests   Final    NONE Performed at [facility], [address], [place], [place] [address]    Culture   Final    NO GROWTH Performed at [facility], [place] [address] [place], [place] [address]    Report Status 12/19/2018 FINAL  Final  CULTURE, BLOOD (ROUTINE X 2) w Reflex to ID Panel     Status: None (Preliminary result)   Collection Time: 12/18/18  8:40 AM   Specimen: BLOOD  Result Value Ref Range Status   Specimen Description BLOOD BLOOD RIGHT FOREARM  Final   Special Requests   Final    BOTTLES  DRAWN AEROBIC AND ANAEROBIC Blood Culture results may not be optimal due to an inadequate volume of blood received in culture bottles   Culture   Final    NO GROWTH 2 DAYS Performed at [facility], [address] [place], [place] [address]    Report Status PENDING  Incomplete  CULTURE, BLOOD (ROUTINE X 2) w Reflex to ID Panel     Status: None (Preliminary result)   Collection Time: 12/18/18  9:02 AM   Specimen: BLOOD  Result Value Ref Range Status   Specimen Description BLOOD BLOOD LEFT ARM  Final   Special Requests   Final    BOTTLES DRAWN AEROBIC AND ANAEROBIC Blood Culture adequate volume   Culture   Final    NO GROWTH 2 DAYS Performed at [facility], [address] [place], [place] [address]    Report Status PENDING  Incomplete  MRSA PCR Screening     Status: None   Collection Time: 12/18/18  9:14 AM   Specimen: Nasal Mucosa; Nasopharyngeal  Result Value Ref Range Status   MRSA by PCR NEGATIVE NEGATIVE Final    Comment:        The GeneXpert MRSA Assay (FDA approved for NASAL specimens only), is one component of a comprehensive MRSA colonization surveillance program. It is not intended to diagnose MRSA infection nor to guide or monitor treatment for MRSA infections. Performed at [facility], [address] [place],  [address]           IMAGING    [place] Abscess Drain  Result Date: 12/20/2018 INDICATION: 84 year old with acute respiratory failure, sepsis and acute kidney injury. Unable to place Foley catheter due to urethral stricture. Patient needs a suprapubic catheter. EXAM: PLACEMENT OF SUPRAPUBIC CATHETER WITH ULTRASOUND GUIDANCE MEDICATIONS: Inpatient and receiving IV antibiotics. ANESTHESIA/SEDATION: Patient was already intubated and sedated. CONTRAST:  None FLUOROSCOPY TIME:  None COMPLICATIONS: None immediate. PROCEDURE: The anterior lower abdomen and pelvis was evaluated with ultrasound. A distended urinary bladder was identified. The skin was shaved. The skin was prepped with chlorhexidine and sterile field was created. Maximal barrier sterile technique was utilized including caps, mask, sterile gowns, sterile gloves, sterile drape, hand hygiene and skin antiseptic. Skin was anesthetized with 1% lidocaine. Using ultrasound guidance, an 18 gauge trocar needle was directed into the urinary bladder. Stiff Amplatz wire was advanced into the bladder. The tract was dilated to accommodate a 12 [place] drain. Approximately 500 mL of urine was drained into a gravity bag. Catheter was sutured to skin and secured to skin with a StatLock. FINDINGS: Distended urinary bladder. 12 French drain placed within the  bladder. Bladder was decompressed by the end of the procedure. Greater than 500 mL of fluid was immediately removed from the urinary bladder. IMPRESSION: Successful ultrasound-guided suprapubic catheter placement. Electronically Signed   By: Markus Daft M.D.   On: 12/20/2018 16:24   US PELVIS LIMITED (TRANSABDOMINAL ONLY)  Result Date: 12/20/2018 CLINICAL DATA:  84 year old with acute renal failure and unable to place a Foley catheter. Evaluate bladder for suprapubic catheter placement. EXAM: LIMITED ULTRASOUND OF PELVIS TECHNIQUE: Limited transabdominal ultrasound examination of the pelvis was performed. COMPARISON:  Renal ultrasound 12/19/2018 FINDINGS: Fluid in the urinary bladder. Calculated bladder volume is 435 mL. No unexpected structures located between the bladder and the anterior abdominal wall. IMPRESSION: Fluid in the urinary bladder.  Calculated bladder volume is 435 mL. Electronically Signed   By: Markus Daft M.D.   On: 12/20/2018 13:34  [place] Intraoperative  Result Date: 12/20/2018 CLINICAL DATA:  Ultrasound was provided for use by the ordering physician, and a technical charge was applied by the performing facility.  No radiologist interpretation/professional services rendered.       Indwelling Urinary Catheter continued, requirement due to   Reason to continue Indwelling Urinary Catheter strict Intake/Output monitoring for hemodynamic instability   Central Line/ continued, requirement due to  Reason to continue [place] of central venous pressure or other hemodynamic parameters and poor IV access   Ventilator continued, requirement due to severe respiratory failure   Ventilator Sedation RASS 0 to -2      ASSESSMENT AND PLAN SYNOPSIS  SEVERE SEPSIS WITH PNEUMONIA WITH SEVERE LACTIC ACIDOSIS WITH PROGRESSIVE ADN SEVERE HYPOXIC RESP FAILURE  WITH PROGRESSIVE RENAL FAILURE WITH PROBABLE UNDERLYING DIASTOLIC CARDIAC DYSFUNCTION WITH AFIB WITH RVR    Severe  ACUTE Hypoxic and Hypercapnic Respiratory Failure -continue Full MV support -continue Bronchodilator Therapy -Wean Fio2 and PEEP as tolerated -will perform SAT/SBT when respiratory parameters are met  ACUTE DIASTOLIC CARDIAC FAILURE-     ACUTE KIDNEY INJURY/Renal Failure-very low UOP -follow chem 7 -follow UO -continue Foley Catheter-assess need -Avoid nephrotoxic agents -Recheck creatinine  Needs CRRT  NEUROLOGY - intubated and sedated - minimal sedation to achieve a RASS goal: -1 Wake up assessment pending   SHOCK-SEPSIS/HYPOVOLUMIC/CARDIOGENIC -use vasopressors to keep MAP>65 as needed -follow ABG and LA -follow up cultures -emperic ABX -consider stress dose steroids  CARDIAC ICU monitoring  ID -continue IV abx as prescibed -follow up cultures  GI GI PROPHYLAXIS as indicated  NUTRITIONAL STATUS DIET-->TF's as tolerated Constipation protocol as indicated  ENDO - will use ICU hypoglycemic\Hyperglycemia protocol if indicated   ELECTROLYTES -follow labs as needed -replace as needed -pharmacy consultation and following   DVT/GI PRX ordered TRANSFUSIONS AS NEEDED MONITOR FSBS ASSESS the need for LABS as needed   Critical Care Time devoted to patient care services described in this note is 35 minutes.   Overall, patient is critically ill, prognosis is guarded.  Patient with Multiorgan failure and at high risk for cardiac arrest and death.    Corrin Parker, M.D.  [facility]  Medical Director [place] Director [facility] Cardio-Pulmonary Department

## 2018-12-21 NOTE — Progress Notes (Signed)
Inpatient Diabetes Program Recommendations  AACE/ADA: New Consensus Statement on Inpatient Glycemic Control (2015)  Target Ranges:  Prepandial:   less than 140 mg/dL      Peak postprandial:   less than 180 mg/dL (1-2 hours)      Critically ill patients:  140 - 180 mg/dL   Lab Results  Component Value Date   GLUCAP 261 (H) 12/21/2018   HGBA1C 6.3 (H) 12/24/2018    Review of Glycemic Control  Results for Carlos Obrien, Carlos Obrien (MRN [id number]) as of 12/21/2018 10:50  Ref. Range 12/20/2018 15:38 12/20/2018 19:24 12/20/2018 23:48 12/21/2018 04:05 12/21/2018 07:11  Glucose-Capillary Latest Ref Range: 70 - 99 mg/dL 187 (H) 190 (H) 160 (H) 235 (H) 261 (H)    Diabetes history:  DM2  Outpatient Diabetes medications: Lantus 26 units QHS; Glipizide 5 mg QAM; Metformin 1000mg  BID; Actos 30 mg QD Current orders for Inpatient glycemic control: Novolog 0-20 Q4 hrs; Lantus 26 units daily  Inpatient Diabetes Program Recommendations:    -Please consider adding tube feed coverage Novolog 3 units Q4Hrs.  Hold if tube feeds are held or discontinued.  Thank you, Geoffry Paradise, RN, BSN Diabetes Coordinator Inpatient Diabetes Program [phone] (team pager from 8a-5p)

## 2018-12-21 NOTE — Progress Notes (Signed)
[facility]  ROUNDING NOTE   Subjective:  Patient tolerating CRRT well so far. Ultrafiltration increased this morning to make the patient net -1 L over 24 hours. Discussed with nursing in depth.   Objective:  Vital signs in last 24 hours:  Temp:  [98.4 F (36.9 C)-99.4 F (37.4 C)] 99.4 F (37.4 C) (12/17 0700) Pulse Rate:  [74-113] 87 (12/17 0900) Resp:  [10-21] 16 (12/17 0900) BP: (86-142)/(48-79) 99/52 (12/17 0900) SpO2:  [89 %-100 %] 96 % (12/17 0900) FiO2 (%):  [30 %-40 %] 30 % (12/17 0811)  Weight change:  Filed Weights   12/18/18 0500 12/18/18 1000 12/20/18 0401  Weight: (!) 137.1 kg 135.5 kg (!) 141.2 kg    Intake/Output: I/O last 3 completed shifts: In: 5295.9 [I.V.:3330; Other:460; NG/GT:1305.8; IV Piggyback:200.1] Out: 2714 [Urine:630; LFYBO:1751; Blood:13]   Intake/Output this shift:  Total I/O In: 545 [I.V.:445; IV Piggyback:100] Out: 278 [Other:278]  Physical Exam: General: Critically ill-appearing  Head: Endotracheal tube in place  Eyes: Anicteric  Neck: Supple, trachea midline  Lungs:  Bilateral rhonchi, vent assisted  Heart: S1S2 irregular  Abdomen:  Soft, nontender, bowel sounds present  Extremities: 1+ peripheral edema.  Neurologic: Intubated, sedated  Skin: No lesions  Access: Left IJ temporary dialysis catheter    Basic Metabolic Panel: Recent Labs  Lab 12/18/18 1914 12/19/18 0422 12/20/18 0420 12/20/18 1553 12/21/18 0429  NA 133* 137 136 134* 134*  K 4.8 4.9 5.4* 5.3* 5.3*  CL 104 105 105 104 103  CO2 18* 21* 18* 22 22  GLUCOSE 218* 177* 211* 224* 261*  BUN 52* 55* 74* 70* 43*  CREATININE 3.07* 2.98* 4.37* 3.61* 2.51*  CALCIUM 8.1* 8.4* 7.5* 7.6* 7.7*  MG  --  1.9 2.2  --  2.3  PHOS  --  4.7* 5.0* 4.3 3.3    Liver Function Tests: Recent Labs  Lab 12/31/2018 2129 12/20/18 1553 12/21/18 0429  AST 47*  --   --   ALT 25  --   --   ALKPHOS 76  --   --   BILITOT 0.9  --   --   PROT 6.6  --   --   ALBUMIN 3.5  3.1* 2.7*   No results for input(s): LIPASE, AMYLASE in the last 168 hours. No results for input(s): AMMONIA in the last 168 hours.  CBC: Recent Labs  Lab 01/01/2019 2129 12/18/18 0848 12/19/18 0422 12/20/18 0420 12/21/18 0429  WBC 4.5 8.3 8.7 19.2* 10.1  NEUTROABS 3.3  --   --   --  6.8  HGB 10.3* 10.9* 9.8* 9.7* 8.7*  HCT 31.8* 33.1* 31.2* 31.0* 28.3*  MCV 91.9 92.7 97.2 96.9 98.6  PLT 120* 141* 122* 159 118*    Cardiac Enzymes: No results for input(s): CKTOTAL, CKMB, CKMBINDEX, TROPONINI in the last 168 hours.  BNP: Invalid input(s): POCBNP  CBG: Recent Labs  Lab 12/20/18 1538 12/20/18 1924 12/20/18 2348 12/21/18 0405 12/21/18 0711  GLUCAP 187* 190* 160* 235* 8*    Microbiology: Results for orders placed or performed during the hospital encounter of 12/23/2018  Blood Culture (routine x 2)     Status: None (Preliminary result)   Collection Time: 12/22/2018  9:20 PM   Specimen: BLOOD  Result Value Ref Range Status   Specimen Description BLOOD RIGHT HAND  Final   Special Requests   Final    BOTTLES DRAWN AEROBIC AND ANAEROBIC Blood Culture adequate volume   Culture   Final    NO  GROWTH 4 DAYS Performed at [facility], [address] [place], [place] [address]    Report Status PENDING  Incomplete  Blood Culture (routine x 2)     Status: None (Preliminary result)   Collection Time: 12/22/2018  9:29 PM   Specimen: BLOOD  Result Value Ref Range Status   Specimen Description BLOOD RIGHT ANTECUBITAL  Final   Special Requests   Final    BOTTLES DRAWN AEROBIC AND ANAEROBIC Blood Culture adequate volume   Culture   Final    NO GROWTH 4 DAYS Performed at [facility], [address], [place], [place] [address]    Report Status PENDING  Incomplete  Respiratory Panel by RT PCR (Flu A&B, Covid) -     Status: None   Collection Time: 12/10/2018 11:41 PM  Result Value Ref Range Status   SARS Coronavirus 2 by RT PCR NEGATIVE NEGATIVE Final    Comment:  (NOTE) SARS-CoV-2 target nucleic acids are NOT DETECTED. The SARS-CoV-2 RNA is generally detectable in upper respiratoy specimens during the acute phase of infection. The lowest concentration of SARS-CoV-2 viral copies this assay can detect is 131 copies/mL. A negative result does not preclude SARS-Cov-2 infection and should not be used as the sole basis for treatment or other patient management decisions. A negative result may occur with  improper specimen collection/handling, submission of specimen other than nasopharyngeal swab, presence of viral mutation(s) within the areas targeted by this assay, and inadequate number of viral copies (<131 copies/mL). A negative result must be combined with clinical observations, patient history, and epidemiological information. The expected result is Negative. Fact Sheet for Patients:  PinkCheek.be Fact Sheet for Healthcare Providers:  GravelBags.it This test is not yet ap proved or cleared by the [place] FDA and  has been authorized for detection and/or diagnosis of SARS-CoV-2 by FDA under an Emergency Use Authorization (EUA). This EUA will remain  in effect (meaning this test can be used) for the duration of the COVID-19 declaration under Section 564(b)(1) of the Act, 21 U.S.C. section 360bbb-3(b)(1), unless the authorization is terminated or revoked sooner.    Influenza A by PCR NEGATIVE NEGATIVE Final   Influenza B by PCR NEGATIVE NEGATIVE Final    Comment: (NOTE) The Xpert Xpress SARS-CoV-2/FLU/RSV assay is intended as an aid in  the diagnosis of influenza from Nasopharyngeal swab specimens and  should not be used as a sole basis for treatment. Nasal washings and  aspirates are unacceptable for Xpert Xpress SARS-CoV-2/FLU/RSV  testing. Fact Sheet for Patients: PinkCheek.be Fact Sheet for Healthcare  Providers: GravelBags.it This test is not yet approved or cleared by the [place] FDA and  has been authorized for detection and/or diagnosis of SARS-CoV-2 by  FDA under an Emergency Use Authorization (EUA). This EUA will remain  in effect (meaning this test can be used) for the duration of the  Covid-19 declaration under Section 564(b)(1) of the Act, 21  U.S.C. section 360bbb-3(b)(1), unless the authorization is  terminated or revoked. Performed at [facility], [address], [place], [place] [address]   Urine culture     Status: None   Collection Time: 12/18/18  4:45 AM   Specimen: Urine, Random  Result Value Ref Range Status   Specimen Description   Final    URINE, RANDOM Performed at [facility] [address] [place],  [address]    Special Requests   Final    NONE Performed at [facility], [place]  [address], [place], [place] [address]    Culture   Final    NO GROWTH Performed at [facility], [place] [address], [place], [place] [address]    Report Status 12/19/2018 FINAL  Final  CULTURE, BLOOD (ROUTINE X 2) w Reflex to ID Panel     Status: None (Preliminary result)   Collection Time: 12/18/18  8:40 AM   Specimen: BLOOD  Result Value Ref Range Status   Specimen Description BLOOD BLOOD RIGHT FOREARM  Final   Special Requests   Final    BOTTLES DRAWN AEROBIC AND ANAEROBIC Blood Culture results may not be optimal due to an inadequate volume of blood received in culture bottles   Culture   Final    NO GROWTH 3 DAYS Performed at [facility], [address], [place], [place] [address]    Report Status PENDING  Incomplete  CULTURE, BLOOD (ROUTINE X 2) w Reflex to ID Panel     Status: None (Preliminary result)   Collection Time: 12/18/18  9:02 AM   Specimen: BLOOD  Result Value Ref Range Status   Specimen Description BLOOD BLOOD LEFT ARM  Final   Special Requests   Final    BOTTLES DRAWN  AEROBIC AND ANAEROBIC Blood Culture adequate volume   Culture   Final    NO GROWTH 3 DAYS Performed at [facility], [address], [place], [place] [address]    Report Status PENDING  Incomplete  MRSA PCR Screening     Status: None   Collection Time: 12/18/18  9:14 AM   Specimen: Nasal Mucosa; Nasopharyngeal  Result Value Ref Range Status   MRSA by PCR NEGATIVE NEGATIVE Final    Comment:        The GeneXpert MRSA Assay (FDA approved for NASAL specimens only), is one component of a comprehensive MRSA colonization surveillance program. It is not intended to diagnose MRSA infection nor to guide or monitor treatment for MRSA infections. Performed at [facility], [address] [place], [place] [address]     Coagulation Studies: Recent Labs    12/19/18 1214 12/20/18 0450  LABPROT 21.0* 22.6*  INR 1.8* 2.0*    Urinalysis: No results for input(s): COLORURINE, LABSPEC, PHURINE, GLUCOSEU, HGBUR, BILIRUBINUR, KETONESUR, PROTEINUR, UROBILINOGEN, NITRITE, LEUKOCYTESUR in the last 72 hours.  Invalid input(s): APPERANCEUR    Imaging: [place] Abscess Drain  Result Date: 12/20/2018 INDICATION: 84 year old with acute respiratory failure, sepsis and acute kidney injury. Unable to place Foley catheter due to urethral stricture. Patient needs a suprapubic catheter. EXAM: PLACEMENT OF SUPRAPUBIC CATHETER WITH ULTRASOUND GUIDANCE MEDICATIONS: Inpatient and receiving IV antibiotics. ANESTHESIA/SEDATION: Patient was already intubated and sedated. CONTRAST:  None FLUOROSCOPY TIME:  None COMPLICATIONS: None immediate. PROCEDURE: The anterior lower abdomen and pelvis was evaluated with ultrasound. A distended urinary bladder was identified. The skin was shaved. The skin was prepped with chlorhexidine and sterile field was created. Maximal barrier sterile technique was utilized including caps, mask, sterile gowns, sterile gloves, sterile drape, hand hygiene and skin antiseptic.  Skin was anesthetized with 1% lidocaine. Using ultrasound guidance, an 18 gauge trocar needle was directed into the urinary bladder. Stiff Amplatz wire was advanced into the bladder. The tract was dilated to accommodate a 12 [place] drain. Approximately 500 mL of urine was drained into a gravity bag. Catheter was sutured to skin and secured to skin with a StatLock. FINDINGS: Distended urinary bladder. 12 French drain placed within the bladder. Bladder was decompressed by the end of  the procedure. Greater than 500 mL of fluid was immediately removed from the urinary bladder. IMPRESSION: Successful ultrasound-guided suprapubic catheter placement. Electronically Signed   By: Markus Daft M.D.   On: 12/20/2018 16:24   US PELVIS LIMITED (TRANSABDOMINAL ONLY)  Result Date: 12/20/2018 CLINICAL DATA:  84 year old with acute renal failure and unable to place a Foley catheter. Evaluate bladder for suprapubic catheter placement. EXAM: LIMITED ULTRASOUND OF PELVIS TECHNIQUE: Limited transabdominal ultrasound examination of the pelvis was performed. COMPARISON:  Renal ultrasound 12/19/2018 FINDINGS: Fluid in the urinary bladder. Calculated bladder volume is 435 mL. No unexpected structures located between the bladder and the anterior abdominal wall. IMPRESSION: Fluid in the urinary bladder.  Calculated bladder volume is 435 mL. Electronically Signed   By: Markus Daft M.D.   On: 12/20/2018 13:34   [place] Intraoperative  Result Date: 12/20/2018 CLINICAL DATA:  Ultrasound was provided for use by the ordering physician, and a technical charge was applied by the performing facility.  No radiologist interpretation/professional services rendered.   US RENAL  Result Date: 12/19/2018 CLINICAL DATA:  Acute renal failure, history bladder calculi, BPH, coronary artery disease, CHF, diabetes mellitus, hypertension, prostate cancer EXAM: RENAL / URINARY TRACT ULTRASOUND COMPLETE COMPARISON:  Abdomen ultrasound 08/22/2017 FINDINGS:  Right Kidney: Renal measurements: 11.0 x 5.4 x 5.8 cm = volume: 180 mL. Degradation of image quality secondary to body habitus and anasarca. Grossly normal cortical thickness and echogenicity. No definite renal mass or hydronephrosis. Left Kidney: Renal measurements: 11.2 x 4.6 x 4.9 cm = volume: 131 mL. Suboptimal visualization due to body habitus and anasarca. No gross evidence of renal mass or hydronephrosis Bladder: Appears normal for degree of bladder distention. Calculated volume 398 mL. Other: N/A IMPRESSION: Suboptimal visualization of the kidneys due to body habitus and anasarca. No definite renal sonographic abnormalities identified. Electronically Signed   By: Lavonia Dana M.D.   On: 12/19/2018 11:18     Medications:   .  prismasol BGK 4/2.5 1,500 mL/hr at 12/21/18 0915  .  prismasol BGK 4/2.5 1,000 mL/hr at 12/21/18 0915  . sodium chloride Stopped (12/20/18 1031)  . amiodarone 30 mg/hr (12/21/18 1018)  . ceFEPime (MAXIPIME) IV Stopped (12/21/18 0932)  . feeding supplement (VITAL HIGH PROTEIN) 1,000 mL (12/20/18 1637)  . fentaNYL infusion INTRAVENOUS 250 mcg/hr (12/21/18 1018)  . norepinephrine (LEVOPHED) Adult infusion 5 mcg/min (12/21/18 1018)  . prismasol BGK 4/2.5 2,500 mL/hr at 12/21/18 0745   . sodium chloride   Intravenous Once  . aspirin  81 mg Per Tube Daily  . atorvastatin  20 mg Oral QPM  . azithromycin  500 mg Oral Daily  . budesonide (PULMICORT) nebulizer solution  0.5 mg Nebulization BID  . chlorhexidine gluconate (MEDLINE KIT)  15 mL Mouth Rinse BID  . Chlorhexidine Gluconate Cloth  6 each Topical Daily  . feeding supplement (PRO-STAT SUGAR FREE 64)  60 mL Per Tube TID  . insulin aspart  0-20 Units Subcutaneous Q4H  . insulin glargine  26 Units Subcutaneous QHS  . ipratropium-albuterol  3 mL Nebulization Q4H  . mouth rinse  15 mL Mouth Rinse 10 times per day  . multivitamin  15 mL Per Tube Daily  . pantoprazole sodium  40 mg Per Tube Daily  . polyethylene glycol   17 g Per Tube Daily  . senna-docusate  2 tablet Per Tube BID   sodium chloride, heparin, ipratropium-albuterol, LORazepam, vecuronium  Assessment/ Plan:  84 y.o. male with a PMHx of Anemia of chronic kidney disease,  bilateral lower extremity edema, nephrolithiasis, BPH, chronic kidney disease stage IIIb baseline creatinine 1.6 EGFR 42, coronary artery disease, degenerative disc disease, diabetes mellitus type 2, chronic diastolic heart failure, GERD, Barrett's esophagus, hyperlipidemia, hypertension, paroxysmal atrial fibrillation, prostate cancer, urethral stricture , who was admitted to [facility] on 12/05/2018 for evaluation of significant shortness of breath.   1.  Acute kidney injury/chronic kidney disease stage IIIb baseline creatinine 1.6 with a EGFR of 42.  Acute kidney injury secondary to septic shock.  Urine output was 630 cc over the preceding 24 hours.  Renal parameters have improved with CRRT.  Continue CRRT at this time.  We will make the patient net -1 L over the next 24 hours.  Discussed in depth with nursing.  2.  Hyperkalemia.  Potassium down slightly to 5.3.  Maintain the patient on 4K bath for now.  3.  Metabolic acidosis.  Serum bicarbonate currently 22 and improved.  4.  Acute respiratory failure.  We will add ultrafiltration to aid weaning from the ventilator.  LOS: 3 Kristeena Meineke 12/17/202010:25 AM

## 2018-12-21 NOTE — Progress Notes (Signed)
Ch was pg to be present w/ the pt's wife who was expressing significant emotional distress as the staff helped the pt to receive his CRRT. The pt is a 84 y.o. male that was transferred to the ICU on 12/18/18 due to the changes of his status related to labored breathing and being febrile. The ch allowed space for the pt's wife to express her grief related to the decline in the pt's health and how they only have each other. The couple do not have children and the only family member that is local is a cousin of the pt that serves as the HPOA for both the pt and spouse. The pt wife shared how they have been together since high school and it would be a major loss for her if the pt were to pass. The ch understood the pt's wife concerns and gave her space to share her concerns about the future. The pt had tried to treat as UTI at home on his own but became increasingly weak, had night sweats, and a high fever, yet did not feel it warranted a hospsital visit as reported by the spouse. The ch communicate with the staff to see how well the pt was progressing. The pt has several health challenges and thinks the pt could benefit from a palliative care consult or [place] that would consider education regarding his code status. Pt's wife was appreciative of the support that she received form the care team.    12/21/18 1600  Clinical Encounter Type  Visited With Health care provider;Patient not available;Family  Visit Type Follow-up;Psychological support;Spiritual support;Social support;Critical Care  Referral From Nurse  Consult/Referral To Chaplain  Spiritual Encounters  Spiritual Needs Emotional;Grief support  Stress Factors  Patient Stress Factors Family relationships;Exhausted;Health changes;Loss of control;Major life changes  Family Stress Factors Major life changes;Loss of control;Exhausted;Family relationships

## 2018-12-21 NOTE — Progress Notes (Signed)
Pharmacy Antibiotic Note  Carlos Obrien is a 84 y.o. male admitted on 12/16/2018 with pneumonia. Patient presented in ED with 1-2 days of feeling sick at home. Patient admitted to ICU with sepsis work up with tachycadia fever, and hypertension. Patient prevously started on Bactrim DS BID on 12/8 by outpatient MD. Pharmacy has been consulted for cefepime dosing. Patient is currently on antibiotic day 4.  Patient recently started on CRRT for progressive renal failure.  Plan: Continue Cefepime 2g IV Q12 hours while patient remains on CRRT.  Height: 6' 3.98" (193 cm) Weight: (!) 311 lb 4.6 oz (141.2 kg) IBW/kg (Calculated) : 86.76  Temp (24hrs), Avg:99.1 F (37.3 C), Min:98.4 F (36.9 C), Max:99.4 F (37.4 C)  Recent Labs  Lab 12/28/2018 2129 12/18/2018 2341 12/18/18 0848 12/18/18 0858 12/18/18 1914 12/19/18 0422 12/19/18 0843 12/20/18 0420 12/20/18 1553 12/20/18 1555 12/21/18 0429  WBC 4.5  --  8.3  --   --  8.7  --  19.2*  --   --  10.1  CREATININE 2.55*  --  2.35*  --  3.07* 2.98*  --  4.37* 3.61*  --  2.51*  LATICACIDVEN 4.6* 3.3*  --  3.9*  --   --  4.3*  --   --  1.6  --     Estimated Creatinine Clearance: 34.9 mL/min (A) (by C-G formula based on SCr of 2.51 mg/dL (H)).    Allergies  Allergen Reactions  . No Known Allergies     Antimicrobials this admission: Vancomycin 12/13 >> 12/14 Metronidazole 12/13 x 1 Cefepime 12/13 >>  Azithromycin 12/14 >>   Dose adjustments this admission: N/A  Microbiology results: 12/14 BCx: no growth 12/14 UCx: no growth 12/14 MRSA PCR: negative  12/13 Influenza A & B: negative   Thank you for allowing pharmacy to be a part of this patient's care.  Gerald Dexter 12/21/2018 1:40 PM

## 2018-12-21 NOTE — Consult Note (Addendum)
ANTICOAGULATION CONSULT NOTE - Follow up  Pharmacy Consult for Heparin Indication: atrial fibrillation  Allergies  Allergen Reactions  . No Known Allergies     Patient Measurements: Height: 6' 3.98" (193 cm) Weight: (!) 311 lb 4.6 oz (141.2 kg) IBW/kg (Calculated) : 86.76 Heparin Dosing Weight: 116.6 kg  Vital Signs: Temp: 99.4 F (37.4 C) (12/17 0700) Temp Source: Axillary (12/17 0400) BP: 107/55 (12/17 1000) Pulse Rate: 96 (12/17 1000)  Labs: Recent Labs    12/19/18 0422 12/19/18 0442 12/19/18 0843 12/19/18 1214 12/19/18 1952 12/20/18 0420 12/20/18 0450 12/20/18 1553 12/21/18 0429  HGB 9.8*  --   --   --   --  9.7*  --   --  8.7*  HCT 31.2*  --   --   --   --  31.0*  --   --  28.3*  PLT 122*  --   --   --   --  159  --   --  118*  APTT  --   --   --   --  >160* >160*  --   --   --   LABPROT  --   --   --  21.0*  --   --  22.6*  --   --   INR  --   --   --  1.8*  --   --  2.0*  --   --   HEPARINUNFRC  --   --   --   --   --  >3.60*  --   --   --   CREATININE 2.98*  --   --   --   --  4.37*  --  3.61* 2.51*  TROPONINIHS  --  594* 727*  --   --   --   --   --   --     Estimated Creatinine Clearance: 34.9 mL/min (A) (by C-G formula based on SCr of 2.51 mg/dL (H)).   Medical History: Past Medical History:  Diagnosis Date  . Anemia    hx  . Bilateral lower extremity edema   . Bladder calculi   . BPH (benign prostatic hyperplasia)   . CKD (chronic kidney disease)   . Coronary artery disease    cardiologist-- dr Loney Laurence [facility])  lov note in care everywhere tab in epic  . DDD (degenerative disc disease), lumbar   . Diabetes mellitus without complication ([place])   . Diastolic CHF, chronic ([place])   . Dyspnea    exersion  . Dysrhythmia    atrial fib  . GERD (gastroesophageal reflux disease)   . History of adenomatous polyp of colon    2012  . History of Barrett's esophagus   . History of basal cell carcinoma excision    Sept 2016  --scalp  .  History of colon cancer    1999  adenocarcinoma polyp  . History of hiatal hernia   . History of kidney stones   . History of kidney stones   . History of MI (myocardial infarction)    1996  . Hyperlipidemia   . Hypertension   . PAF (paroxysmal atrial fibrillation) ([place])    dx 2015  . Prostate cancer ([place])   . Urethral stricture   . Urinary incontinence      Assessment: 84 year-old male admitted for sepsis and pneumonia.  He has a medical history significant ofDM with hyperlipidemia,a. Fib (E[place]is), h/o CHF, h/o prostate cancer, h/o CAD.  Patient's last dose  of Eliquis was 12/14 @ 2121.  Hemoglobin is 9.7 and platelets are 159; both are stable.     Goal of Therapy:  Heparin level 0.3-0.7 units/ml Monitor platelets by anticoagulation protocol: Yes   Plan:  Heparin remains on hold.  Spoke with MD in rounds this AM.  MD to hold, as patient developed twitch possibly requiring head CT.   Pharmacy will follow up with provider during AM rounds to determine plan for heparin restart.  Gerald Dexter, PharmD Pharmacy Resident  12/21/2018 1:36 PM

## 2018-12-21 NOTE — Consult Note (Addendum)
PHARMACY CONSULT NOTE - FOLLOW UP  Pharmacy Consult for Electrolyte Monitoring and Replacement   Recent Labs: Potassium (mmol/L)  Date Value  12/21/2018 5.3 (H)  08/27/2011 4.6   Magnesium (mg/dL)  Date Value  12/21/2018 2.3   Calcium (mg/dL)  Date Value  12/21/2018 7.7 (L)   Calcium, Total (mg/dL)  Date Value  08/27/2011 8.7   Albumin (g/dL)  Date Value  12/21/2018 2.7 (L)   Phosphorus (mg/dL)  Date Value  12/21/2018 3.3   Sodium (mmol/L)  Date Value  12/21/2018 134 (L)  08/27/2011 22    84 year old male patient admitted with pneumonia and hypoxic respiratory failure, and remains intubated in the ICU.  He has a medical history significant forDM with hyperlipidemia, atrial fibrillation, h/o CHF, h/o prostate cancer, h/o CAD.  Pt was started on amiodarone drip for atrial fibrillation with RVR with likely underlying diastolic dysfunction.  CRRT was started in the setting of progressive renal failure.        Electrolytes CRRT resulting in beneficial downtrend for potassium. Corrected Ca 8.3 Goal of Therapy:  --K  4.0 - 5.1   --Mag 2.0 - 2.4 --Phos 2.5 - 3.0 No supplementation needed at this time. Electrolytes with AM labs.  Glucose BG trending down from the 200s.  FBG 154 this AM. Patient received methylpredinosolone x 2 doses, stopped 12/15. Current regimen: --SSI 0-20 q4 hour --Lantus 26 units QHS --Per Diabetes Coordinator:  Will start 3 units novolog with tube feeds. Last 24h SSI use:  23 units Diet:  Vital and Prostat Feeding through OG tube starting 12/15.  Constipation Last BM recorded on 12/14 Current regimen:  Scheduled Miralax, Senokot-S.  Gerald Dexter, PharmD Pharmacy Resident  12/21/2018 1:19 PM

## 2018-12-21 NOTE — Progress Notes (Signed)
CRRT dialysis catheter clotted at 11:53. Stopped and restarted CRRT machine at 14:06. Wife called back once machine restarted.

## 2018-12-22 ENCOUNTER — Inpatient Hospital Stay (HOSPITAL_COMMUNITY)
Admit: 2018-12-22 | Discharge: 2018-12-22 | Disposition: A | Discharge status: Home / Self Care | Payer: Medicare Other | Attending: Pulmonary Disease | Admitting: Pulmonary Disease

## 2018-12-22 ENCOUNTER — Encounter: Payer: Self-pay | Admitting: Internal Medicine

## 2018-12-22 DIAGNOSIS — I361 Nonrheumatic tricuspid (valve) insufficiency: Secondary | ICD-10-CM

## 2018-12-22 DIAGNOSIS — E785 Hyperlipidemia, unspecified: Secondary | ICD-10-CM

## 2018-12-22 DIAGNOSIS — Z515 Encounter for palliative care: Secondary | ICD-10-CM

## 2018-12-22 DIAGNOSIS — Z7189 Other specified counseling: Secondary | ICD-10-CM

## 2018-12-22 DIAGNOSIS — E1169 Type 2 diabetes mellitus with other specified complication: Secondary | ICD-10-CM

## 2018-12-22 DIAGNOSIS — N186 End stage renal disease: Secondary | ICD-10-CM

## 2018-12-22 LAB — RENAL FUNCTION PANEL
Albumin: 2.4 g/dL — ABNORMAL LOW (ref 3.5–5.0)
Albumin: 2.5 g/dL — ABNORMAL LOW (ref 3.5–5.0)
Anion gap: 9 (ref 5–15)
Anion gap: 7 (ref 5–15)
BUN: 34 mg/dL — ABNORMAL HIGH (ref 8–23)
BUN: 28 mg/dL — ABNORMAL HIGH (ref 8–23)
CO2: 24 mmol/L (ref 22–32)
CO2: 25 mmol/L (ref 22–32)
Calcium: 7.5 mg/dL — ABNORMAL LOW (ref 8.9–10.3)
Calcium: 7.8 mg/dL — ABNORMAL LOW (ref 8.9–10.3)
Chloride: 104 mmol/L (ref 98–111)
Chloride: 103 mmol/L (ref 98–111)
Creatinine, Ser: 1.85 mg/dL — ABNORMAL HIGH (ref 0.61–1.24)
Creatinine, Ser: 1.38 mg/dL — ABNORMAL HIGH (ref 0.61–1.24)
GFR calc Af Amer: 38 mL/min — ABNORMAL LOW (ref >60)
GFR calc Af Amer: 55 mL/min — ABNORMAL LOW (ref >60)
GFR calc non Af Amer: 33 mL/min — ABNORMAL LOW (ref >60)
GFR calc non Af Amer: 47 mL/min — ABNORMAL LOW (ref >60)
Glucose, Bld: 276 mg/dL — ABNORMAL HIGH (ref 70–99)
Glucose, Bld: 270 mg/dL — ABNORMAL HIGH (ref 70–99)
Phosphorus: 2.2 mg/dL — ABNORMAL LOW (ref 2.5–4.6)
Phosphorus: 1.8 mg/dL — ABNORMAL LOW (ref 2.5–4.6)
Potassium: 4.6 mmol/L (ref 3.5–5.1)
Potassium: 4.7 mmol/L (ref 3.5–5.1)
Sodium: 137 mmol/L (ref 135–145)
Sodium: 135 mmol/L (ref 135–145)

## 2018-12-22 LAB — CULTURE, BLOOD (ROUTINE X 2)
Culture: NO GROWTH
Culture: NO GROWTH
Special Requests: ADEQUATE
Special Requests: ADEQUATE

## 2018-12-22 LAB — GLUCOSE, CAPILLARY
Glucose-Capillary: 201 mg/dL — ABNORMAL HIGH (ref 70–99)
Glucose-Capillary: 222 mg/dL — ABNORMAL HIGH (ref 70–99)
Glucose-Capillary: 237 mg/dL — ABNORMAL HIGH (ref 70–99)
Glucose-Capillary: 239 mg/dL — ABNORMAL HIGH (ref 70–99)
Glucose-Capillary: 257 mg/dL — ABNORMAL HIGH (ref 70–99)
Glucose-Capillary: 259 mg/dL — ABNORMAL HIGH (ref 70–99)
Glucose-Capillary: 261 mg/dL — ABNORMAL HIGH (ref 70–99)

## 2018-12-22 LAB — ECHOCARDIOGRAM COMPLETE
Height: 75.984 in
Weight: 4980.63 oz

## 2018-12-22 LAB — MAGNESIUM: Magnesium: 2.3 mg/dL (ref 1.7–2.4)

## 2018-12-22 LAB — PHOSPHORUS: Phosphorus: 2.2 mg/dL — ABNORMAL LOW (ref 2.5–4.6)

## 2018-12-22 MED ORDER — HEPARIN SODIUM (PORCINE) 5000 UNIT/ML IJ SOLN
5000.0000 [IU] | Freq: Three times a day (TID) | INTRAMUSCULAR | Status: DC
  Administered 2018-12-22 – 2018-12-26 (×12): 5000 [IU] via SUBCUTANEOUS
  Filled 2018-12-22 (×12): qty 1

## 2018-12-22 MED ORDER — DEXMEDETOMIDINE HCL IN NACL 400 MCG/100ML IV SOLN
0.4000 ug/kg/h | INTRAVENOUS | Status: DC
  Administered 2018-12-22: 0.4 ug/kg/h via INTRAVENOUS
  Administered 2018-12-22: 0.6 ug/kg/h via INTRAVENOUS
  Administered 2018-12-22: 0.8 ug/kg/h via INTRAVENOUS
  Administered 2018-12-23: 1 ug/kg/h via INTRAVENOUS
  Administered 2018-12-23 (×2): 0.8 ug/kg/h via INTRAVENOUS
  Administered 2018-12-23: 23:00:00 1 ug/kg/h via INTRAVENOUS
  Administered 2018-12-23 (×3): 0.8 ug/kg/h via INTRAVENOUS
  Administered 2018-12-24 – 2018-12-25 (×10): 1 ug/kg/h via INTRAVENOUS
  Administered 2018-12-25: 0.8 ug/kg/h via INTRAVENOUS
  Filled 2018-12-22: qty 100
  Filled 2018-12-22: qty 200
  Filled 2018-12-22 (×18): qty 100

## 2018-12-22 MED ORDER — INSULIN ASPART 100 UNIT/ML ~~LOC~~ SOLN
6.0000 [IU] | SUBCUTANEOUS | Status: DC
  Administered 2018-12-22 – 2018-12-24 (×12): 6 [IU] via SUBCUTANEOUS
  Filled 2018-12-22 (×7): qty 1

## 2018-12-22 MED ORDER — BISACODYL 10 MG RE SUPP
10.0000 mg | Freq: Once | RECTAL | Status: AC
  Administered 2018-12-22: 10 mg via RECTAL
  Filled 2018-12-22: qty 1

## 2018-12-22 MED ORDER — POTASSIUM & SODIUM PHOSPHATES 280-160-250 MG PO PACK
2.0000 | PACK | Freq: Once | ORAL | Status: AC
  Administered 2018-12-22: 2 via ORAL
  Filled 2018-12-22: qty 2

## 2018-12-22 MED ORDER — ALBUMIN HUMAN 25 % IV SOLN
12.5000 g | Freq: Once | INTRAVENOUS | Status: AC
  Administered 2018-12-22: 12.5 g via INTRAVENOUS
  Filled 2018-12-22: qty 50

## 2018-12-22 NOTE — Progress Notes (Signed)
Pt CRRT filter clotted at 0200, blood was successfully returned and CRRT was restarted at 0300 only to re-clot within 10 min. Blood was not able to be returned with second filter. CRRT restarted at 4715 with no complications. Will continue to monitor.

## 2018-12-22 NOTE — Progress Notes (Signed)
Inpatient Diabetes Program Recommendations  AACE/ADA: New Consensus Statement on Inpatient Glycemic Control (2015)  Target Ranges:  Prepandial:   less than 140 mg/dL      Peak postprandial:   less than 180 mg/dL (1-2 hours)      Critically ill patients:  140 - 180 mg/dL   Lab Results  Component Value Date   GLUCAP 237 (H) 12/22/2018   HGBA1C 6.3 (H) 12/12/2018    Review of Glycemic Control  Results for MANOLITO, JUREWICZ (MRN [id number]) as of 12/22/2018 09:42  Ref. Range 12/21/2018 19:16 12/22/2018 00:09 12/22/2018 03:41 12/22/2018 07:27  Glucose-Capillary Latest Ref Range: 70 - 99 mg/dL 245 (H)  NOVOLOG 10units 222 (H)  NOVOLOF 10units 201 (H)  NOVOLOG  10units 237 (H)  NOVOLOG 10units    Diabetes history: DM2 Outpatient Diabetes medications: Lantus 26 units QHS; Glipizide 5 mg QAM; Metformin 1000mg  BID; Actos 30 mg QD Current orders for Inpatient glycemic control: Novolog 0-20 Q4 hrs; Lantus 26 units daily; Novolog tube feed coverage 3 units Q4  Inpatient Diabetes Program Recommendations:     -Please consider increasing tube feed coverage to 6 units Q4H.  Hold if tube feds held or discontinued.  Thank you, Geoffry Paradise, RN, BSN Diabetes Coordinator Inpatient Diabetes Program [phone] (team pager from 8a-5p)

## 2018-12-22 NOTE — Progress Notes (Signed)
Pharmacy Antibiotic Note  Carlos Obrien is a 84 y.o. male admitted on 12/30/2018 with pneumonia. Patient presented in ED with 1-2 days of feeling sick at home. Patient admitted to ICU with sepsis work up with tachycadia fever, and hypertension. Patient prevously started on Bactrim DS BID on 12/8 by outpatient MD. Pharmacy has been consulted for cefepime dosing. Patient is currently on antibiotic day 6.  Patient recently started on CRRT for progressive renal failure.  Plan: Continue Cefepime 2g IV Q12 hours while patient remains on CRRT.  Height: 6' 3.98" (193 cm) Weight: (!) 311 lb 4.6 oz (141.2 kg) IBW/kg (Calculated) : 86.76  Temp (24hrs), Avg:99.5 F (37.5 C), Min:98.3 F (36.8 C), Max:100.5 F (38.1 C)  Recent Labs  Lab 12/28/2018 2129 12/06/2018 2341 12/18/18 0848 12/18/18 0858 12/19/18 0422 12/19/18 0843 12/20/18 0420 12/20/18 1553 12/20/18 1555 12/21/18 0429 12/21/18 1557 12/22/18 0504  WBC 4.5  --  8.3  --  8.7  --  19.2*  --   --  10.1  --   --   CREATININE 2.55*  --  2.35*  --  2.98*  --  4.37* 3.61*  --  2.51* 2.40* 1.85*  LATICACIDVEN 4.6* 3.3*  --  3.9*  --  4.3*  --   --  1.6  --   --   --     Estimated Creatinine Clearance: 47.3 mL/min (A) (by C-G formula based on SCr of 1.85 mg/dL (H)).    Allergies  Allergen Reactions  . No Known Allergies     Antimicrobials this admission: Vancomycin 12/13 >> 12/14 Metronidazole 12/13 x 1 Cefepime 12/13 >>  Azithromycin 12/14 >>   Dose adjustments this admission: N/A  Microbiology results: 12/13 BCx: no growth 12/14 UCx: no growth 12/14 MRSA PCR: negative  12/13 Influenza A & B: negative   Thank you for allowing pharmacy to be a part of this patient's care.  Gerald Dexter 12/22/2018 7:58 AM

## 2018-12-22 NOTE — Progress Notes (Signed)
[facility]  ROUNDING NOTE   Subjective:  Patient seen and evaluated at bedside. Continues to be tolerating CRRT well at the moment. Urine output was only 135 cc over the preceding 24 hours. Patient was net -1.2 L yesterday.   Objective:  Vital signs in last 24 hours:  Temp:  [98.3 F (36.8 C)-100.5 F (38.1 C)] 99.4 F (37.4 C) (12/18 1150) Pulse Rate:  [52-110] 86 (12/18 1330) Resp:  [12-30] 14 (12/18 1330) BP: (73-123)/(44-69) 78/48 (12/18 1330) SpO2:  [92 %-99 %] 99 % (12/18 1330) FiO2 (%):  [30 %] 30 % (12/18 1145)  Weight change:  Filed Weights   12/18/18 0500 12/18/18 1000 12/20/18 0401  Weight: (!) 137.1 kg 135.5 kg (!) 141.2 kg    Intake/Output: I/O last 3 completed shifts: In: 1867.7 [I.V.:1397.7; NG/GT:170; IV Piggyback:300] Out: 4665 [Urine:215; LDJTT:0177]   Intake/Output this shift:  Total I/O In: 801 [I.V.:311.9; NG/GT:350; IV Piggyback:139.1] Out: 1264 [Urine:30; Other:1234]  Physical Exam: General: Critically ill-appearing  Head: Endotracheal tube in place  Eyes: Anicteric  Neck: Supple, trachea midline  Lungs:  Bilateral rhonchi, vent assisted  Heart: S1S2 irregular  Abdomen:  Soft, nontender, bowel sounds present  Extremities: 1+ peripheral edema.  Neurologic: Intubated, sedated  Skin: No lesions  Access: Left IJ temporary dialysis catheter    Basic Metabolic Panel: Recent Labs  Lab 12/19/18 0422 12/20/18 0420 12/20/18 1553 12/21/18 0429 12/21/18 1557 12/22/18 0504  NA 137 136 134* 134* 136 137  K 4.9 5.4* 5.3* 5.3* 5.0 4.6  CL 105 105 104 103 103 104  CO2 21* 18* _0 GLUCOSE 177* 211* 224* 261* 259* 276*  BUN 55* 74* 70* 43* 40* 34*  CREATININE 2.98* 4.37* 3.61* 2.51* 2.40* 1.85*  CALCIUM 8.4* 7.5* 7.6* 7.7* 7.9* 7.5*  MG 1.9 2.2  --  2.3  --  2.3  PHOS 4.7* 5.0* 4.3 3.3 2.8 2.2*  2.2*    Liver Function Tests: Recent Labs  Lab 12/23/2018 2129 12/20/18 1553 12/21/18 0429 12/21/18 1557  12/22/18 0504  AST 47*  --   --   --   --   ALT 25  --   --   --   --   ALKPHOS 76  --   --   --   --   BILITOT 0.9  --   --   --   --   PROT 6.6  --   --   --   --   ALBUMIN 3.5 3.1* 2.7* 2.6* 2.4*   No results for input(s): LIPASE, AMYLASE in the last 168 hours. No results for input(s): AMMONIA in the last 168 hours.  CBC: Recent Labs  Lab 12/15/2018 2129 12/18/18 0848 12/19/18 0422 12/20/18 0420 12/21/18 0429  WBC 4.5 8.3 8.7 19.2* 10.1  NEUTROABS 3.3  --   --   --  6.8  HGB 10.3* 10.9* 9.8* 9.7* 8.7*  HCT 31.8* 33.1* 31.2* 31.0* 28.3*  MCV 91.9 92.7 97.2 96.9 98.6  PLT 120* 141* 122* 159 118*    Cardiac Enzymes: No results for input(s): CKTOTAL, CKMB, CKMBINDEX, TROPONINI in the last 168 hours.  BNP: Invalid input(s): POCBNP  CBG: Recent Labs  Lab 12/21/18 1916 12/22/18 0009 12/22/18 0341 12/22/18 0727 12/22/18 1120  GLUCAP 245* 222* 201* 45* 67*    Microbiology: Results for orders placed or performed during the hospital encounter of 01/01/2019  Blood Culture (routine x 2)     Status: None   Collection Time:  12/19/2018  9:20 PM   Specimen: BLOOD  Result Value Ref Range Status   Specimen Description BLOOD RIGHT HAND  Final   Special Requests   Final    BOTTLES DRAWN AEROBIC AND ANAEROBIC Blood Culture adequate volume   Culture   Final    NO GROWTH 5 DAYS Performed at [facility], [address], [place], [place] [address]    Report Status 12/22/2018 FINAL  Final  Blood Culture (routine x 2)     Status: None   Collection Time: 12/22/2018  9:29 PM   Specimen: BLOOD  Result Value Ref Range Status   Specimen Description BLOOD RIGHT ANTECUBITAL  Final   Special Requests   Final    BOTTLES DRAWN AEROBIC AND ANAEROBIC Blood Culture adequate volume   Culture   Final    NO GROWTH 5 DAYS Performed at [facility], [address] [place], [place] [address]    Report Status 12/22/2018 FINAL  Final  Respiratory Panel by RT PCR (Flu A&B,  Covid) -     Status: None   Collection Time: 12/16/2018 11:41 PM  Result Value Ref Range Status   SARS Coronavirus 2 by RT PCR NEGATIVE NEGATIVE Final    Comment: (NOTE) SARS-CoV-2 target nucleic acids are NOT DETECTED. The SARS-CoV-2 RNA is generally detectable in upper respiratoy specimens during the acute phase of infection. The lowest concentration of SARS-CoV-2 viral copies this assay can detect is 131 copies/mL. A negative result does not preclude SARS-Cov-2 infection and should not be used as the sole basis for treatment or other patient management decisions. A negative result may occur with  improper specimen collection/handling, submission of specimen other than nasopharyngeal swab, presence of viral mutation(s) within the areas targeted by this assay, and inadequate number of viral copies (<131 copies/mL). A negative result must be combined with clinical observations, patient history, and epidemiological information. The expected result is Negative. Fact Sheet for Patients:  PinkCheek.be Fact Sheet for Healthcare Providers:  GravelBags.it This test is not yet ap proved or cleared by the [place] FDA and  has been authorized for detection and/or diagnosis of SARS-CoV-2 by FDA under an Emergency Use Authorization (EUA). This EUA will remain  in effect (meaning this test can be used) for the duration of the COVID-19 declaration under Section 564(b)(1) of the Act, 21 U.S.C. section 360bbb-3(b)(1), unless the authorization is terminated or revoked sooner.    Influenza A by PCR NEGATIVE NEGATIVE Final   Influenza B by PCR NEGATIVE NEGATIVE Final    Comment: (NOTE) The Xpert Xpress SARS-CoV-2/FLU/RSV assay is intended as an aid in  the diagnosis of influenza from Nasopharyngeal swab specimens and  should not be used as a sole basis for treatment. Nasal washings and  aspirates are unacceptable for Xpert Xpress  SARS-CoV-2/FLU/RSV  testing. Fact Sheet for Patients: PinkCheek.be Fact Sheet for Healthcare Providers: GravelBags.it This test is not yet approved or cleared by the [place] FDA and  has been authorized for detection and/or diagnosis of SARS-CoV-2 by  FDA under an Emergency Use Authorization (EUA). This EUA will remain  in effect (meaning this test can be used) for the duration of the  Covid-19 declaration under Section 564(b)(1) of the Act, 21  U.S.C. section 360bbb-3(b)(1), unless the authorization is  terminated or revoked. Performed at [facility], [address] [place],  [address]   Urine culture     Status: None   Collection Time: 12/18/18  4:45 AM  Specimen: Urine, Random  Result Value Ref Range Status   Specimen Description   Final    URINE, RANDOM Performed at [facility], [address], [place], [place] [address]    Special Requests   Final    NONE Performed at [facility], [address], [place], [place] [address]    Culture   Final    NO GROWTH Performed at [facility], [place] [address], [place], [place] [address]    Report Status 12/19/2018 FINAL  Final  CULTURE, BLOOD (ROUTINE X 2) w Reflex to ID Panel     Status: None (Preliminary result)   Collection Time: 12/18/18  8:40 AM   Specimen: BLOOD  Result Value Ref Range Status   Specimen Description BLOOD BLOOD RIGHT FOREARM  Final   Special Requests   Final    BOTTLES DRAWN AEROBIC AND ANAEROBIC Blood Culture results may not be optimal due to an inadequate volume of blood received in culture bottles   Culture   Final    NO GROWTH 4 DAYS Performed at [facility], [address] [place], [place] [address]    Report Status PENDING  Incomplete  CULTURE, BLOOD (ROUTINE X 2) w Reflex to ID Panel     Status: None (Preliminary result)   Collection Time: 12/18/18  9:02 AM   Specimen: BLOOD   Result Value Ref Range Status   Specimen Description BLOOD BLOOD LEFT ARM  Final   Special Requests   Final    BOTTLES DRAWN AEROBIC AND ANAEROBIC Blood Culture adequate volume   Culture   Final    NO GROWTH 4 DAYS Performed at [facility], [address], [place], [place] [address]    Report Status PENDING  Incomplete  MRSA PCR Screening     Status: None   Collection Time: 12/18/18  9:14 AM   Specimen: Nasal Mucosa; Nasopharyngeal  Result Value Ref Range Status   MRSA by PCR NEGATIVE NEGATIVE Final    Comment:        The GeneXpert MRSA Assay (FDA approved for NASAL specimens only), is one component of a comprehensive MRSA colonization surveillance program. It is not intended to diagnose MRSA infection nor to guide or monitor treatment for MRSA infections. Performed at [facility], [address] [place], [place] [address]     Coagulation Studies: Recent Labs    12/20/18 0450  LABPROT 22.6*  INR 2.0*    Urinalysis: No results for input(s): COLORURINE, LABSPEC, PHURINE, GLUCOSEU, HGBUR, BILIRUBINUR, KETONESUR, PROTEINUR, UROBILINOGEN, NITRITE, LEUKOCYTESUR in the last 72 hours.  Invalid input(s): APPERANCEUR    Imaging: [place] Abscess Drain  Result Date: 12/20/2018 INDICATION: 84 year old with acute respiratory failure, sepsis and acute kidney injury. Unable to place Foley catheter due to urethral stricture. Patient needs a suprapubic catheter. EXAM: PLACEMENT OF SUPRAPUBIC CATHETER WITH ULTRASOUND GUIDANCE MEDICATIONS: Inpatient and receiving IV antibiotics. ANESTHESIA/SEDATION: Patient was already intubated and sedated. CONTRAST:  None FLUOROSCOPY TIME:  None COMPLICATIONS: None immediate. PROCEDURE: The anterior lower abdomen and pelvis was evaluated with ultrasound. A distended urinary bladder was identified. The skin was shaved. The skin was prepped with chlorhexidine and sterile field was created. Maximal barrier sterile technique was utilized  including caps, mask, sterile gowns, sterile gloves, sterile drape, hand hygiene and skin antiseptic. Skin was anesthetized with 1% lidocaine. Using ultrasound guidance, an 18 gauge trocar needle was directed into the urinary bladder. Stiff Amplatz wire was advanced into the bladder. The tract was  dilated to accommodate a 12 [place] drain. Approximately 500 mL of urine was drained into a gravity bag. Catheter was sutured to skin and secured to skin with a StatLock. FINDINGS: Distended urinary bladder. 12 French drain placed within the bladder. Bladder was decompressed by the end of the procedure. Greater than 500 mL of fluid was immediately removed from the urinary bladder. IMPRESSION: Successful ultrasound-guided suprapubic catheter placement. Electronically Signed   By: Markus Daft M.D.   On: 12/20/2018 16:24   ECHOCARDIOGRAM COMPLETE  Result Date: 12/22/2018   ECHOCARDIOGRAM REPORT   Patient Name:   JOSETH WEIGEL Date of Exam: 12/22/2018 Medical Rec #:  [id number]     Height: Accession #:    [id number]    Weight: Date of Birth:  09-03-1934     BSA: Patient Age:    84 years      BP:           95/48 mmHg Patient Gender: M             HR:           89 bpm. Exam Location:  [facility] Procedure: 2D Echo, Cardiac Doppler and Color Doppler Indications:     Bacteremia 790.7  History:         Patient has no prior history of Echocardiogram examinations.                  Risk Factors:Diabetes. PAF.  Sonographer:     Sherrie Sport RDCS (AE) Referring Phys:  [id number] CARMEN Veda Canning Diagnosing Phys: Ida Rogue MD  Sonographer Comments: Technically difficult study due to poor echo windows and echo performed with patient supine and on artificial respirator. Pt was getting dialysis during echo- echo performed from the right side of pt bed. IMPRESSIONS  1. Left ventricular ejection fraction, by visual estimation, is 60 to 65%. The left ventricle has normal function. There is no left ventricular hypertrophy.  2. Left ventricular  diastolic parameters are indeterminate.  3. The left ventricle has no regional wall motion abnormalities.  4. Global right ventricle has normal systolic function.The right ventricular size is normal. No increase in right ventricular wall thickness.  5. Left atrial size was mildly dilated.  6. Mildly elevated pulmonary artery systolic pressure.  7. No valve vegetation noted. FINDINGS  Left Ventricle: Left ventricular ejection fraction, by visual estimation, is 60 to 65%. The left ventricle has normal function. The left ventricle has no regional wall motion abnormalities. There is no left ventricular hypertrophy. Left ventricular diastolic parameters are indeterminate. Normal left atrial pressure. Right Ventricle: The right ventricular size is normal. No increase in right ventricular wall thickness. Global RV systolic function is has normal systolic function. The tricuspid regurgitant velocity is 2.81 m/s, and with an assumed right atrial pressure  of 5 mmHg, the estimated right ventricular systolic pressure is mildly elevated at 36.6 mmHg. Left Atrium: Left atrial size was mildly dilated. Right Atrium: Right atrial size was normal in size Pericardium: There is no evidence of pericardial effusion. Mitral Valve: The mitral valve is normal in structure. No evidence of mitral valve regurgitation. No evidence of mitral valve stenosis by observation. Tricuspid Valve: The tricuspid valve is normal in structure. Tricuspid valve regurgitation is mild. Aortic Valve: The aortic valve is normal in structure. Aortic valve regurgitation is not visualized. The aortic valve is structurally normal, with no evidence of sclerosis or stenosis. Pulmonic Valve: The pulmonic valve was normal in structure. Pulmonic valve regurgitation is not visualized.  Pulmonic regurgitation is not visualized. Aorta: The aortic root, ascending aorta and aortic arch are all structurally normal, with no evidence of dilitation or obstruction. Venous: The  inferior vena cava is normal in size with greater than 50% respiratory variability, suggesting right atrial pressure of 3 mmHg. IAS/Shunts: No atrial level shunt detected by color flow Doppler. There is no evidence of a patent foramen ovale. No ventricular septal defect is seen or detected. There is no evidence of an atrial septal defect.  TRICUSPID VALVE TR Peak grad:   31.6 mmHg TR Vmax:        281.00 cm/s  Ida Rogue MD Electronically signed by Ida Rogue MD Signature Date/Time: 12/22/2018/12:24:36 PM    Final      Medications:   .  prismasol BGK 4/2.5 1,500 mL/hr at 12/22/18 0426  .  prismasol BGK 4/2.5 1,000 mL/hr at 12/22/18 0425  . sodium chloride Stopped (12/20/18 1031)  . amiodarone 30 mg/hr (12/22/18 1400)  . ceFEPime (MAXIPIME) IV 2 g (12/22/18 0911)  . dexmedetomidine (PRECEDEX) IV infusion 0.4 mcg/kg/hr (12/22/18 1400)  . feeding supplement (VITAL HIGH PROTEIN) 1,000 mL (12/22/18 1209)  . fentaNYL infusion INTRAVENOUS Stopped (12/22/18 1306)  . norepinephrine (LEVOPHED) Adult infusion 1 mcg/min (12/22/18 0500)  . prismasol BGK 4/2.5 2,500 mL/hr at 12/22/18 0426   . sodium chloride   Intravenous Once  . aspirin  81 mg Per Tube Daily  . atorvastatin  20 mg Oral QPM  . bisacodyl  10 mg Rectal Once  . budesonide (PULMICORT) nebulizer solution  0.5 mg Nebulization BID  . chlorhexidine gluconate (MEDLINE KIT)  15 mL Mouth Rinse BID  . Chlorhexidine Gluconate Cloth  6 each Topical Daily  . feeding supplement (PRO-STAT SUGAR FREE 64)  60 mL Per Tube TID  . heparin injection (subcutaneous)  5,000 Units Subcutaneous Q8H  . insulin aspart  0-20 Units Subcutaneous Q4H  . insulin aspart  6 Units Subcutaneous Q4H  . insulin glargine  26 Units Subcutaneous QHS  . ipratropium-albuterol  3 mL Nebulization Q4H  . mouth rinse  15 mL Mouth Rinse 10 times per day  . multivitamin  15 mL Per Tube Daily  . pantoprazole sodium  40 mg Per Tube Daily  . polyethylene glycol  17 g Per Tube  Daily  . senna-docusate  2 tablet Per Tube BID   sodium chloride, heparin, LORazepam  Assessment/ Plan:  84 y.o. male with a PMHx of Anemia of chronic kidney disease, bilateral lower extremity edema, nephrolithiasis, BPH, chronic kidney disease stage IIIb baseline creatinine 1.6 EGFR 42, coronary artery disease, degenerative disc disease, diabetes mellitus type 2, chronic diastolic heart failure, GERD, Barrett's esophagus, hyperlipidemia, hypertension, paroxysmal atrial fibrillation, prostate cancer, urethral stricture , who was admitted to [facility] on 12/22/2018 for evaluation of significant shortness of breath.   1.  Acute kidney injury/chronic kidney disease stage IIIb baseline creatinine 1.6 with a EGFR of 42.  Acute kidney injury secondary to septic shock.   -Urine output has dropped to 135 cc over the preceding 24 hours.  Patient tolerating CRRT well.  He was net -1.2 L yesterday.  Continue CRRT at this time with a target of 1 L net negative by tomorrow.  2.  Hyperkalemia.   Potassium currently 4.6 and acceptable.  Continue to monitor.  3.  Metabolic acidosis.  Significantly improved with CRRT.  Serum bicarbonate currently 24.  4.  Acute respiratory failure.  Patient maintained on ventilatory support at this time.  Vent management as per  pulmonary/critical care.  LOS: 4 Blanch Stang 12/18/20202:07 PM

## 2018-12-22 NOTE — Progress Notes (Addendum)
Follow up - Critical Care Medicine Note  Patient Details:    Carlos Obrien is an 84 y.o. male 84 year old with a history of persistent atrial fibrillation, diabetes mellitus, congestive heart failure and prostate cancer presented on 12/19/2018 with fever and respiratory failure.  Currently intubated and mechanically ventilated.  He has had acute renal failure requiring CRRT and bladder outlet obstruction requiring suprapubic catheter.  Lines, Airways, Drains: Airway 8 mm (Active)  Secured at (cm) 25 cm 12/22/18 0751  Measured From Lips 12/22/18 0751  Secured Location Right 12/22/18 0751  Secured By [employer] 12/22/18 0751  Tube Holder Repositioned Yes 12/22/18 0751  Cuff Pressure (cm H2O) 28 cm H2O 12/22/18 0751  Site Condition Cool;Dry 12/22/18 0751     Closed System Drain 1 Midline Back Accordion (Hemovac) 10 Fr. (Active)     NG/OG Tube Orogastric Center mouth Xray Measured external length of tube 65 cm (Active)  Cm Marking at Nare/Corner of Mouth (if applicable) 65 cm 17/51/02 0745  Site Assessment Clean;Dry;Intact 12/22/18 0745  Ongoing Placement Verification No change in cm markings or external length of tube from initial placement 12/22/18 0745  Status Infusing tube feed 12/22/18 0745  Intake (mL) 120 mL 12/21/18 1600  Output (mL) 0 mL 12/20/18 1600     Suprapubic Catheter 12 Fr. (Active)  Site Assessment Clean;Intact;Dry 12/22/18 0745  Dressing Status Clean;Intact;Dry 12/22/18 0745  Dressing Type Dry dressing 12/22/18 0745  Collection Container Standard drainage bag 12/22/18 0745  Securement Method Sutured 12/22/18 0745  Indication for Insertion or Continuance of Catheter Bladder outlet obstruction / other urologic reason 12/22/18 0745  Output (mL) 0 mL 12/22/18 0800    Anti-infectives:  Anti-infectives (From admission, onward)   Start     Dose/Rate Route Frequency Ordered Stop   12/20/18 2200  ceFEPIme (MAXIPIME) 2 g in sodium chloride 0.9 % 100 mL IVPB      2 g 200 mL/hr over 30 Minutes Intravenous Every 12 hours 12/20/18 1543 12/24/18 2359   12/18/18 1430  azithromycin (ZITHROMAX) tablet 500 mg     500 mg Oral Daily 12/18/18 1419 12/23/18 0959   12/18/18 1115  ceFEPIme (MAXIPIME) 2 g in sodium chloride 0.9 % 100 mL IVPB  Status:  Discontinued     2 g 200 mL/hr over 30 Minutes Intravenous Daily 12/18/18 1103 12/20/18 1543   12/18/18 0830  ceFEPIme (MAXIPIME) 2 g in sodium chloride 0.9 % 100 mL IVPB  Status:  Discontinued    Note to Pharmacy: Dosing per pharmacy   2 g 200 mL/hr over 30 Minutes Intravenous Every 24 hours 12/18/18 0827 12/18/18 0857   12/18/18 0830  vancomycin (VANCOCIN) IVPB 1000 mg/200 mL premix  Status:  Discontinued    Note to Pharmacy: Dosing per phamaryc   1,000 mg 200 mL/hr over 60 Minutes Intravenous Every 12 hours 12/18/18 0828 12/18/18 0857   12/18/18 0115  vancomycin (VANCOCIN) 1,500 mg in sodium chloride 0.9 % 500 mL IVPB     1,500 mg 250 mL/hr over 120 Minutes Intravenous STAT 12/18/18 0047 12/18/18 0416   12/18/18 0030  oseltamivir (TAMIFLU) capsule 75 mg  Status:  Discontinued     75 mg Oral 2 times daily 12/18/18 0024 12/18/18 1011   12/18/18 0030  sulfamethoxazole-trimethoprim (BACTRIM DS) 800-160 MG per tablet 1 tablet  Status:  Discontinued     1 tablet Oral 2 times daily 12/18/18 0027 12/18/18 1227   12/29/2018 2215  ceFEPIme (MAXIPIME) 2 g in sodium chloride 0.9 % 100  mL IVPB     2 g 200 mL/hr over 30 Minutes Intravenous STAT 12/24/2018 2206 12/30/2018 2340   12/23/2018 2145  ceFEPIme (MAXIPIME) 2 g in sodium chloride 0.9 % 100 mL IVPB  Status:  Discontinued     2 g 200 mL/hr over 30 Minutes Intravenous  Once 12/31/2018 2131 12/14/2018 2206   12/15/2018 2145  metroNIDAZOLE (FLAGYL) IVPB 500 mg     500 mg 100 mL/hr over 60 Minutes Intravenous  Once 01/02/2019 2131 12/18/2018 2340   12/27/2018 2145  vancomycin (VANCOCIN) IVPB 1000 mg/200 mL premix     1,000 mg 200 mL/hr over 60 Minutes Intravenous  Once 12/27/2018 2131  12/18/18 0036      Microbiology: Results for orders placed or performed during the hospital encounter of 12/18/2018  Blood Culture (routine x 2)     Status: None   Collection Time: 12/20/2018  9:20 PM   Specimen: BLOOD  Result Value Ref Range Status   Specimen Description BLOOD RIGHT HAND  Final   Special Requests   Final    BOTTLES DRAWN AEROBIC AND ANAEROBIC Blood Culture adequate volume   Culture   Final    NO GROWTH 5 DAYS Performed at [facility], [address], [place], [place] [address]    Report Status 12/22/2018 FINAL  Final  Blood Culture (routine x 2)     Status: None   Collection Time: 12/10/2018  9:29 PM   Specimen: BLOOD  Result Value Ref Range Status   Specimen Description BLOOD RIGHT ANTECUBITAL  Final   Special Requests   Final    BOTTLES DRAWN AEROBIC AND ANAEROBIC Blood Culture adequate volume   Culture   Final    NO GROWTH 5 DAYS Performed at [facility], [address] [place], [place] [address]    Report Status 12/22/2018 FINAL  Final  Respiratory Panel by RT PCR (Flu A&B, Covid) -     Status: None   Collection Time: 12/13/2018 11:41 PM  Result Value Ref Range Status   SARS Coronavirus 2 by RT PCR NEGATIVE NEGATIVE Final    Comment: (NOTE) SARS-CoV-2 target nucleic acids are NOT DETECTED. The SARS-CoV-2 RNA is generally detectable in upper respiratoy specimens during the acute phase of infection. The lowest concentration of SARS-CoV-2 viral copies this assay can detect is 131 copies/mL. A negative result does not preclude SARS-Cov-2 infection and should not be used as the sole basis for treatment or other patient management decisions. A negative result may occur with  improper specimen collection/handling, submission of specimen other than nasopharyngeal swab, presence of viral mutation(s) within the areas targeted by this assay, and inadequate number of viral copies (<131 copies/mL). A negative result must be combined with  clinical observations, patient history, and epidemiological information. The expected result is Negative. Fact Sheet for Patients:  PinkCheek.be Fact Sheet for Healthcare Providers:  GravelBags.it This test is not yet ap proved or cleared by the [place] FDA and  has been authorized for detection and/or diagnosis of SARS-CoV-2 by FDA under an Emergency Use Authorization (EUA). This EUA will remain  in effect (meaning this test can be used) for the duration of the COVID-19 declaration under Section 564(b)(1) of the Act, 21 U.S.C. section 360bbb-3(b)(1), unless the authorization is terminated or revoked sooner.    Influenza A by PCR NEGATIVE NEGATIVE Final   Influenza B by PCR NEGATIVE NEGATIVE Final    Comment: (NOTE) The Xpert Xpress SARS-CoV-2/FLU/RSV assay is intended as an aid in  the diagnosis of influenza from Nasopharyngeal swab specimens and  should not be used as a sole basis for treatment. Nasal washings and  aspirates are unacceptable for Xpert Xpress SARS-CoV-2/FLU/RSV  testing. Fact Sheet for Patients: PinkCheek.be Fact Sheet for Healthcare Providers: GravelBags.it This test is not yet approved or cleared by the [place] FDA and  has been authorized for detection and/or diagnosis of SARS-CoV-2 by  FDA under an Emergency Use Authorization (EUA). This EUA will remain  in effect (meaning this test can be used) for the duration of the  Covid-19 declaration under Section 564(b)(1) of the Act, 21  U.S.C. section 360bbb-3(b)(1), unless the authorization is  terminated or revoked. Performed at [facility], [address], [place], [place] [address]   Urine culture     Status: None   Collection Time: 12/18/18  4:45 AM   Specimen: Urine, Random  Result Value Ref Range Status   Specimen Description   Final    URINE, RANDOM Performed at  [facility], [address] [place], [place] [address]    Special Requests   Final    NONE Performed at [facility], [address], [place], [place] [address]    Culture   Final    NO GROWTH Performed at [facility], [place] [address], [place], [place] [address]    Report Status 12/19/2018 FINAL  Final  CULTURE, BLOOD (ROUTINE X 2) w Reflex to ID Panel     Status: None (Preliminary result)   Collection Time: 12/18/18  8:40 AM   Specimen: BLOOD  Result Value Ref Range Status   Specimen Description BLOOD BLOOD RIGHT FOREARM  Final   Special Requests   Final    BOTTLES DRAWN AEROBIC AND ANAEROBIC Blood Culture results may not be optimal due to an inadequate volume of blood received in culture bottles   Culture   Final    NO GROWTH 4 DAYS Performed at [facility], [address], [place], [place] [address]    Report Status PENDING  Incomplete  CULTURE, BLOOD (ROUTINE X 2) w Reflex to ID Panel     Status: None (Preliminary result)   Collection Time: 12/18/18  9:02 AM   Specimen: BLOOD  Result Value Ref Range Status   Specimen Description BLOOD BLOOD LEFT ARM  Final   Special Requests   Final    BOTTLES DRAWN AEROBIC AND ANAEROBIC Blood Culture adequate volume   Culture   Final    NO GROWTH 4 DAYS Performed at [facility], [address], [place], [place] [address]    Report Status PENDING  Incomplete  MRSA PCR Screening     Status: None   Collection Time: 12/18/18  9:14 AM   Specimen: Nasal Mucosa; Nasopharyngeal  Result Value Ref Range Status   MRSA by PCR NEGATIVE NEGATIVE Final    Comment:        The GeneXpert MRSA Assay (FDA approved for NASAL specimens only), is one component of a comprehensive MRSA colonization surveillance program. It is not intended to diagnose MRSA infection nor to guide or monitor treatment for MRSA infections. Performed at [facility], [address], [place],  [address]      Best Practice/Protocols:  VTE Prophylaxis: Heparin (For CRRT) GI Prophylaxis: Proton Pump Inhibitor Continous Sedation Hyperglycemia (ICU)  Events: 12/14Patient was transferred to ICU for increased WOB and fevers and sepsis HR 170, started on amiodarone 12/15 Intubated, sedated, VASC cath placed, mulitorgan failure,  Unable to place foley catheter by Urology 12/16plan for IR suprapubic catheter placement, remains on vent, severe shock, progressive renal failure 12/18 delirium, this is limiting spontaneous breathing trials, Precedex initiated  Studies: DG Chest 1 View  Result Date: 12/19/2018 CLINICAL DATA:  Shortness of breath. EXAM: CHEST  1 VIEW COMPARISON:  Radiograph 12/16/2018 FINDINGS: Lower lung volumes from prior exam. Cardiomegaly is not significantly changed allowing for differences in technique. Bronchovascular crowding the similar interstitial prominence to prior. No confluent airspace disease. No large pleural effusion. No pneumothorax. Unchanged osseous structures. IMPRESSION: 1. Lower lung volumes from prior exam. Cardiomegaly is grossly stable. 2. Bronchovascular crowding secondary to lower lung volumes with grossly unchanged interstitial prominence. Electronically Signed   By: Keith Rake M.D.   On: 12/19/2018 05:12   [place] Abscess Drain  Result Date: 12/20/2018 INDICATION: 84 year old with acute respiratory failure, sepsis and acute kidney injury. Unable to place Foley catheter due to urethral stricture. Patient needs a suprapubic catheter. EXAM: PLACEMENT OF SUPRAPUBIC CATHETER WITH ULTRASOUND GUIDANCE MEDICATIONS: Inpatient and receiving IV antibiotics. ANESTHESIA/SEDATION: Patient was already intubated and sedated. CONTRAST:  None FLUOROSCOPY TIME:  None COMPLICATIONS: None immediate. PROCEDURE: The anterior lower abdomen and pelvis was evaluated with ultrasound. A distended urinary bladder was identified. The skin was shaved. The skin was prepped with  chlorhexidine and sterile field was created. Maximal barrier sterile technique was utilized including caps, mask, sterile gowns, sterile gloves, sterile drape, hand hygiene and skin antiseptic. Skin was anesthetized with 1% lidocaine. Using ultrasound guidance, an 18 gauge trocar needle was directed into the urinary bladder. Stiff Amplatz wire was advanced into the bladder. The tract was dilated to accommodate a 12 [place] drain. Approximately 500 mL of urine was drained into a gravity bag. Catheter was sutured to skin and secured to skin with a StatLock. FINDINGS: Distended urinary bladder. 12 French drain placed within the bladder. Bladder was decompressed by the end of the procedure. Greater than 500 mL of fluid was immediately removed from the urinary bladder. IMPRESSION: Successful ultrasound-guided suprapubic catheter placement. Electronically Signed   By: Markus Daft M.D.   On: 12/20/2018 16:24   US PELVIS LIMITED (TRANSABDOMINAL ONLY)  Result Date: 12/20/2018 CLINICAL DATA:  84 year old with acute renal failure and unable to place a Foley catheter. Evaluate bladder for suprapubic catheter placement. EXAM: LIMITED ULTRASOUND OF PELVIS TECHNIQUE: Limited transabdominal ultrasound examination of the pelvis was performed. COMPARISON:  Renal ultrasound 12/19/2018 FINDINGS: Fluid in the urinary bladder. Calculated bladder volume is 435 mL. No unexpected structures located between the bladder and the anterior abdominal wall. IMPRESSION: Fluid in the urinary bladder.  Calculated bladder volume is 435 mL. Electronically Signed   By: Markus Daft M.D.   On: 12/20/2018 13:34   [place] Intraoperative  Result Date: 12/20/2018 CLINICAL DATA:  Ultrasound was provided for use by the ordering physician, and a technical charge was applied by the performing facility.  No radiologist interpretation/professional services rendered.   US RENAL  Result Date: 12/19/2018 CLINICAL DATA:  Acute renal failure, history bladder  calculi, BPH, coronary artery disease, CHF, diabetes mellitus, hypertension, prostate cancer EXAM: RENAL / URINARY TRACT ULTRASOUND COMPLETE COMPARISON:  Abdomen ultrasound 08/22/2017 FINDINGS: Right Kidney: Renal measurements: 11.0 x 5.4 x 5.8 cm = volume: 180 mL. Degradation of image quality secondary to body habitus and anasarca. Grossly normal cortical thickness and echogenicity. No definite renal mass or hydronephrosis. Left Kidney: Renal measurements: 11.2 x 4.6 x 4.9 cm = volume: 131 mL. Suboptimal visualization due to body  habitus and anasarca. No gross evidence of renal mass or hydronephrosis Bladder: Appears normal for degree of bladder distention. Calculated volume 398 mL. Other: N/A IMPRESSION: Suboptimal visualization of the kidneys due to body habitus and anasarca. No definite renal sonographic abnormalities identified. Electronically Signed   By: Lavonia Dana M.D.   On: 12/19/2018 11:18   DG Chest Port 1 View  Result Date: 12/19/2018 CLINICAL DATA:  Encounter for central line placement. EXAM: PORTABLE CHEST 1 VIEW COMPARISON:  Chest x-ray 12/19/2018. FINDINGS: Endotracheal tube, left IJ line, NG tube in stable position. Cardiomegaly. Mild bilateral subsegmental atelectasis and/or infiltrates again noted without interim change. No pleural effusion or pneumothorax. IMPRESSION: 1. Interim placement left IJ line, its tip is over the superior vena cava. Endotracheal tube and NG tube in stable position. 2.  Stable cardiomegaly. 3. Mild bilateral subsegmental atelectasis and or infiltrate again noted without interim change. Electronically Signed   By: Marcello Moores  Register   On: 12/19/2018 09:26   DG Chest Port 1 View  Result Date: 12/19/2018 CLINICAL DATA:  Status post intubation. EXAM: PORTABLE CHEST 1 VIEW COMPARISON:  Same day. FINDINGS: Stable cardiomediastinal silhouette. Endotracheal and nasogastric tubes are in grossly good position. No pneumothorax is noted. No significant pleural effusion is  noted. Left upper lobe linear opacities are noted concerning for atelectasis or possibly infiltrates. Bony thorax is unremarkable. IMPRESSION: Endotracheal and nasogastric tubes are in grossly good position. Left upper lobe linear opacities are noted concerning for atelectasis or possibly infiltrates. Follow-up radiographs are recommended. Electronically Signed   By: Marijo Conception M.D.   On: 12/19/2018 08:23   DG Chest Port 1 View  Result Date: 12/09/2018 CLINICAL DATA:  Initial evaluation for acute fever, cough, congestion. EXAM: PORTABLE CHEST 1 VIEW COMPARISON:  Prior CT from 06/20/2017 FINDINGS: Mild cardiomegaly. Mediastinal silhouette within normal limits. Lungs hypoinflated. Mild diffuse interstitial prominence, which could reflect interstitial congestion and/or atypical pneumonitis. No consolidative opacity identified. No frank alveolar edema. No pleural effusion. No pneumothorax. No acute osseous finding. IMPRESSION: 1. Mild diffuse interstitial prominence, which could reflect interstitial congestion and/or atypical pneumonitis. No consolidative opacity identified. 2. Mild cardiomegaly without frank edema. Electronically Signed   By: Jeannine Boga M.D.   On: 12/29/2018 21:50   DG Abd Portable 1V  Result Date: 12/19/2018 CLINICAL DATA:  NG tube placement EXAM: PORTABLE ABDOMEN - 1 VIEW COMPARISON:  12/19/2018 FINDINGS: NG tube projects over the mid stomach body. Mild gaseous distention of the colon and small bowel in the left abdomen. No definite obstruction pattern. No abnormal calcifications. Degenerative changes of the spine. IMPRESSION: NG tube within the mid stomach body.  Nonspecific bowel gas pattern. Electronically Signed   By: Jerilynn Mages.  Shick M.D.   On: 12/19/2018 08:23    Consults: Treatment Team:  Flora Lipps, MD Abbie Sons, MD   Subjective:    Overnight Issues: No overnight issues, agitated when sedation was lightened.  Significant anasarca.  Remains with CVR on  amiodarone  Objective:  Vital signs for last 24 hours: Temp:  [98.3 F (36.8 C)-100.5 F (38.1 C)] 98.3 F (36.8 C) (12/18 0745) Pulse Rate:  [52-110] 100 (12/18 0800) Resp:  [12-30] 22 (12/18 0800) BP: (92-123)/(48-65) 103/57 (12/18 0800) SpO2:  [92 %-99 %] 99 % (12/18 0800) FiO2 (%):  [30 %] 30 % (12/18 0751)  Hemodynamic parameters for last 24 hours:    Intake/Output from previous day: 12/17 0701 - 12/18 0700 In: 1426.7 [I.V.:1106.7; NG/GT:120; IV Piggyback:200] Out: 2750 [Urine:135]  Intake/Output this shift: Total I/O In: 1958.8 [I.V.:108.8; NG/GT:1850] Out: 125 [Other:125]  Vent settings for last 24 hours: Vent Mode: PRVC FiO2 (%):  [30 %] 30 % Set Rate:  [16 bmp] 16 bmp Vt Set:  [550 mL] 550 mL PEEP:  [5 cmH20] 5 cmH20 Plateau Pressure:  [14 KTG25-63 cmH20] 15 cmH20  Physical Exam:  GENERAL: Obese gentleman, sedated, intubated and mechanically ventilated.  Wake-up assessment resulted in agitation. HEAD: Normocephalic, atraumatic.  EYES: Pupils equal, round, reactive to light.  No scleral icterus.  MOUTH: Orotracheally intubated, OG in place NECK: Supple.  Trachea midline PULMONARY: Coarse breath sounds, no wheezes CARDIOVASCULAR: S1 and S2.  IRR, CVR. No murmurs, rubs, or gallops.  GASTROINTESTINAL: Soft, non-distended. Positive bowel sounds.  MUSCULOSKELETAL: Anasarca 2-3+, no joint deformity NEUROLOGIC: Sedated, agitated when sedation is lightened. SKIN:anasarca,2-3+, blistering of UE skin due to edema, thin skin, echymoses   Assessment/Plan:    Acute hypoxic respiratory failure, ventilator dependent Pneumonia, community-acquired NO ORGANISM SPECIFIED Volume overload due to acute renal failure Continue ventilator support SAT and SBT as tolerated Continue cefepime, 5 days a azithromycin Continue bronchodilator therapy for mucociliary clearance Volume overload management with hemofiltration in CRRT  Acute on chronic renal failure with attendant  electrolyte imbalance Hyperkalemia Metabolic acidosis Baseline CKD IIIb with creatinine 1.6, EGFR 42 Bladder outlet obstruction, no hydronephrosis Continue CRRT per renal Continue suprapubic catheter drainage Electrolyte management per renal (CRRT) and pharmacy Avoid nephrotoxins Careful dosing of all medications and antibiotics  Severe sepsis Septic shock-resolved Community-acquired pneumonia, NO ORGANISM SPECIFIED Continue cefepime and azithromycin, cefepime total of 7 days azithromycin total 5 days Cultures have been NEGATIVE (no Strep pyogenes bacteremia identified)  Toxic metabolic encephalopathy with delirium due to the above Ventilator discomfort Continue fentanyl infusion Initiated Precedex due to agitation  Persistent atrial fibrillation RVR now CVR Continue amiodarone transition to via OG as tolerated On heparin for CRRT  Nutrition Tube feeds as tolerated Albumin supplementation Bowel regimen as indicated  Diabetes mellitus with hyperglycemia due to physiologic stress Close CBG monitoring ICU hyperglycemia protocol  Skin care Significant subcutaneous tissue edema, fluid removal via CRRT Albumin supplementation to aid and reducing third spacing Wrap upper extremities to absorb fluid from blisters  EOL issues Patient will be DNR Continue current management but no resuscitation in the event of cardiac arrest   LOS: 4 days   Additional comments:Multidisciplinary rounds were performed with ICU staff  Critical Care Total Time*: 45 Minutes  C. Derrill Kay, MD [facility] 12/22/2018  *Care during the described time interval was provided by me and/or other providers on the critical care team.  I have reviewed this patient's available data, including medical history, events of note, physical examination and test results as part of my evaluation.  **This note was dictated using voice recognition software/Dragon.  Despite best efforts to proofread, errors  can occur which can change the meaning.  Any change was purely unintentional.

## 2018-12-22 NOTE — Consult Note (Addendum)
ANTICOAGULATION CONSULT NOTE - Follow up  Pharmacy Consult for Heparin Indication: atrial fibrillation  Allergies  Allergen Reactions  . No Known Allergies     Patient Measurements: Height: 6' 3.98" (193 cm) Weight: (!) 311 lb 4.6 oz (141.2 kg) IBW/kg (Calculated) : 86.76 Heparin Dosing Weight: 116.6 kg  Vital Signs: Temp: 98.3 F (36.8 C) (12/18 0745) Temp Source: Oral (12/18 0745) BP: 105/53 (12/18 0700) Pulse Rate: 99 (12/18 0745)  Labs: Recent Labs    12/19/18 0843 12/19/18 1214 12/19/18 1952 12/20/18 0420 12/20/18 0450 12/21/18 0429 12/21/18 1557 12/22/18 0504  HGB  --   --   --  9.7*  --  8.7*  --   --   HCT  --   --   --  31.0*  --  28.3*  --   --   PLT  --   --   --  159  --  118*  --   --   APTT  --   --  >160* >160*  --   --   --   --   LABPROT  --  21.0*  --   --  22.6*  --   --   --   INR  --  1.8*  --   --  2.0*  --   --   --   HEPARINUNFRC  --   --   --  >3.60*  --   --   --   --   CREATININE  --   --   --  4.37*  --  2.51* 2.40* 1.85*  TROPONINIHS 727*  --   --   --   --   --   --   --     Estimated Creatinine Clearance: 47.3 mL/min (A) (by C-G formula based on SCr of 1.85 mg/dL (H)).   Medical History: Past Medical History:  Diagnosis Date  . Anemia    hx  . Bilateral lower extremity edema   . Bladder calculi   . BPH (benign prostatic hyperplasia)   . CKD (chronic kidney disease)   . Coronary artery disease    cardiologist-- dr Loney [facility]rence [facility])  lov note in care everywhere tab in epic  . DDD (degenerative disc disease), lumbar   . Diabetes mellitus without complication ([place])   . Diastolic CHF, chronic ([place])   . Dyspnea    exersion  . Dysrhythmia    atrial fib  . GERD (gastroesophageal reflux disease)   . History of adenomatous polyp of colon    2012  . History of Barrett's esophagus   . History of basal cell carcinoma excision    Sept 2016  --scalp  . History of colon cancer    1999  adenocarcinoma polyp  .  History of hiatal hernia   . History of kidney stones   . History of kidney stones   . History of MI (myocardial infarction)    1996  . Hyperlipidemia   . Hypertension   . PAF (paroxysmal atrial fibrillation) ([place])    dx 2015  . Prostate cancer ([place])   . Urethral stricture   . Urinary incontinence      Assessment: 84 year-old male admitted for sepsis and pneumonia.  He has a medical history significant ofDM with hyperlipidemia,a. Fib ([place]), h/o CHF, h/o prostate cancer, h/o CAD.  Patient's last dose of Eliquis was 12/14 @ 2121.  Hemoglobin is 9.7 and platelets are 159; both are stable.  Goal of Therapy:  Heparin level 0.3-0.7 units/ml Monitor platelets by anticoagulation protocol: Yes   Plan:  Heparin is on hold, and likely will not be restarted. Patient is now receiving DVT prophylaxis with SQH.     Gerald Dexter, PharmD Pharmacy Resident  12/22/2018 7:59 AM

## 2018-12-22 NOTE — Consult Note (Signed)
Consultation Note Date: 12/22/2018   Patient Name: Carlos Obrien  DOB: 07-16-1934  MRN: [id number]  Age / Sex: 84 y.o., male  PCP: Idelle Crouch, MD Referring Physician: Flora Lipps, MD  Reason for Consultation: Establishing goals of care and Psychosocial/spiritual support  HPI/Patient Profile: 84 y.o. male  with past medical history of DM with hyperlipidemia,a. Fib, h/o CHF, h/o prostate cancer, h/o CAD admitted on 12/08/2018 with severe sepsis with PNE with severe lactic acid.    Clinical Assessment and Goals of Care: I have reviewed medical records including EPIC notes, labs and imaging, received report from CCM attending and bedside nursing staff, assessed the patient and then met at the bedside along with wife Diane  to discuss diagnosis prognosis, [place], EOL wishes, disposition and options.  I introduced Palliative Medicine as specialized medical care for people living with serious illness. It focuses on providing relief from the symptoms and stress of a serious illness. The goal is to improve quality of life for both the patient and the family.  Unfortunately, Diane associated palliative care with hospice and seemed to be upset by our discussions.   We discussed a brief life review of the patient. Mr. And Mrs Teutsch have been married for 25 years and were together for 25 years prior to that.  They have no children, and Diane tells me that Mr. Levett has no children from his first marriage. 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   As far as functional and nutritional status, he has been driving until this illness.  They go to their [place] condo for pleasure.   We discussed his current illness and what it means in the larger context of on-going co-morbidities.  Natural disease trajectory and expectations at EOL were discussed.  I attempted to elicit values and goals  of care important to the patient.  Diane tells me that her goals is for Mr. Vanevery to come home.   The difference between aggressive medical intervention and comfort care was considered in light of the patient's goals of care. At this point, all measure to prolong life are wanted.   Advanced directives, concepts specific to code status, artifical feeding and hydration, and rehospitalization were considered and discussed.    Diane tells me that she and Mr. Cochrane have discussed AD and that he has told her that he would want all measure to keep him alive.   Questions and concerns were addressed.  Hard Choices booklet left for review. The family was encouraged to call with questions or concerns.    HCPOA     NEXT OF KIN - Diane Koziol.  Cousin Eddie is secondary HCPOA     SUMMARY OF RECOMMENDATIONS   Continue full scope/full code.   Code Status/Advance Care Planning:  Full code  Symptom Management:   Per CCM, no additional needs at this time.   Palliative Prophylaxis:   Oral Care and Turn Reposition  Additional Recommendations (Limitations, Scope, Preferences):  Full  Scope Treatment  Psycho-social/Spiritual:   Desire for further Chaplaincy support:no  Additional Recommendations: Caregiving  Support/Resources and Education on Hospice  Prognosis:   Unable to determine, poor outcome    Discharge Planning: to be determined, based on outcomes.       Primary Diagnoses: Present on Admission: . Fever . AKI (acute kidney injury) ([place]) . Type 2 diabetes mellitus with hyperlipidemia ([place])   I have reviewed the medical record, interviewed the patient and family, and examined the patient. The following aspects are pertinent.  Past Medical History:  Diagnosis Date  . Anemia    hx  . Bilateral lower extremity edema   . Bladder calculi   . BPH (benign prostatic hyperplasia)   . CKD (chronic kidney disease)   . Coronary artery disease    cardiologist-- dr Loney [facility]  clinic)  lov note in care everywhere tab in epic  . DDD (degenerative disc disease), lumbar   . Diabetes mellitus without complication ([place])   . Diastolic CHF, chronic ([place])   . Dyspnea    exersion  . Dysrhythmia    atrial fib  . GERD (gastroesophageal reflux disease)   . History of adenomatous polyp of colon    2012  . History of Barrett's esophagus   . History of basal cell carcinoma excision    Sept 2016  --scalp  . History of colon cancer    1999  adenocarcinoma polyp  . History of hiatal hernia   . History of kidney stones   . History of kidney stones   . History of MI (myocardial infarction)    1996  . Hyperlipidemia   . Hypertension   . PAF (paroxysmal atrial fibrillation) ([place])    dx 2015  . Prostate cancer ([place])   . Urethral stricture   . Urinary incontinence    Social History   Socioeconomic History  . Marital status: Married    Spouse name: Not on file  . Number of children: Not on file  . Years of education: Not on file  . Highest education level: Not on file  Occupational History  . Not on file  Tobacco Use  . Smoking status: Never Smoker  . Smokeless tobacco: Never Used  Substance and Sexual Activity  . Alcohol use: No    Alcohol/week: 0.0 standard drinks  . Drug use: No  . Sexual activity: Not on file  Other Topics Concern  . Not on file  Social History Narrative  . Not on file   Social Determinants of Health   Financial Resource Strain:   . Difficulty of Paying Living Expenses: Not on file  Food Insecurity:   . Worried About Charity fundraiser in the Last Year: Not on file  . Ran Out of Food in the Last Year: Not on file  Transportation Needs:   . Lack of Transportation (Medical): Not on file  . Lack of Transportation (Non-Medical): Not on file  Physical Activity:   . Days of Exercise per Week: Not on file  . Minutes of Exercise per Session: Not on file  Stress:   . Feeling of Stress : Not on file  Social Connections:   . Frequency  of Communication with Friends and Family: Not on file  . Frequency of Social Gatherings with Friends and Family: Not on file  . Attends Religious Services: Not on file  . Active Member of Clubs or Organizations: Not on file  . Attends Archivist Meetings: Not on file  .  Marital Status: Not on file   History reviewed. No pertinent family history. Scheduled Meds: . sodium chloride   Intravenous Once  . aspirin  81 mg Per Tube Daily  . atorvastatin  20 mg Oral QPM  . budesonide (PULMICORT) nebulizer solution  0.5 mg Nebulization BID  . chlorhexidine gluconate (MEDLINE KIT)  15 mL Mouth Rinse BID  . Chlorhexidine Gluconate Cloth  6 each Topical Daily  . feeding supplement (PRO-STAT SUGAR FREE 64)  60 mL Per Tube TID  . heparin injection (subcutaneous)  5,000 Units Subcutaneous Q8H  . insulin aspart  0-20 Units Subcutaneous Q4H  . insulin aspart  3 Units Subcutaneous Q4H  . insulin glargine  26 Units Subcutaneous QHS  . ipratropium-albuterol  3 mL Nebulization Q4H  . mouth rinse  15 mL Mouth Rinse 10 times per day  . multivitamin  15 mL Per Tube Daily  . pantoprazole sodium  40 mg Per Tube Daily  . polyethylene glycol  17 g Per Tube Daily  . senna-docusate  2 tablet Per Tube BID   Continuous Infusions: .  prismasol BGK 4/2.5 1,500 mL/hr at 12/22/18 0426  .  prismasol BGK 4/2.5 1,000 mL/hr at 12/22/18 0425  . sodium chloride Stopped (12/20/18 1031)  . amiodarone 30 mg/hr (12/22/18 1018)  . ceFEPime (MAXIPIME) IV 2 g (12/22/18 0911)  . dexmedetomidine (PRECEDEX) IV infusion    . feeding supplement (VITAL HIGH PROTEIN) 1,000 mL (12/21/18 1756)  . fentaNYL infusion INTRAVENOUS 150 mcg/hr (12/22/18 1018)  . norepinephrine (LEVOPHED) Adult infusion 1 mcg/min (12/22/18 0500)  . prismasol BGK 4/2.5 2,500 mL/hr at 12/22/18 0426   PRN Meds:.sodium chloride, heparin, ipratropium-albuterol, LORazepam, vecuronium Medications Prior to Admission:  Prior to Admission medications     Medication Sig Start Date End Date Taking? Authorizing Provider  apixaban (ELIQUIS) 5 MG TABS tablet Take 1 tablet (5 mg total) by mouth 2 (two) times daily. 02/13/16  Yes Costella, Vista Mink, PA-C  atenolol (TENORMIN) 50 MG tablet Take 50 mg by mouth every morning.    Yes [provider]  atorvastatin (LIPITOR) 20 MG tablet Take 20 mg by mouth every evening.    Yes [provider]  Cholecalciferol (VITAMIN D) 2000 units tablet Take 2,000 Units by mouth daily.   Yes [provider]  furosemide (LASIX) 20 MG tablet Take 40 mg by mouth every morning.    Yes [provider]  gabapentin (NEURONTIN) 300 MG capsule Take 100 mg by mouth 3 (three) times daily.    Yes [provider]  glipiZIDE (GLUCOTROL) 5 MG tablet Take 5 mg by mouth 2 (two) times daily.    Yes [provider]  Insulin Glargine (LANTUS SOLOSTAR) 100 UNIT/ML Solostar Pen Inject 26 Units into the skin at bedtime.  03/31/15  Yes [provider]  isosorbide mononitrate (IMDUR) 30 MG 24 hr tablet Take 30 mg by mouth every morning.    Yes [provider]  Liraglutide (VICTOZA) 18 MG/3ML SOPN Inject 1.8 mg into the skin every morning.    Yes [provider]  magnesium oxide (MAG-OX) 400 MG tablet Take 400 mg by mouth every evening.    Yes [provider]  meloxicam (MOBIC) 7.5 MG tablet Take 7.5 mg by mouth daily. 10/04/18  Yes [provider]  metFORMIN (GLUCOPHAGE) 500 MG tablet Take 1,000 mg by mouth 2 (two) times daily with a meal.    Yes [provider]  nitroGLYCERIN (NITROSTAT) 0.4 MG SL tablet Place 0.4 mg  under the tongue every 5 (five) minutes as needed for chest pain.   Yes [provider]  omeprazole (PRILOSEC) 40 MG capsule Take 40 mg by mouth every morning.    Yes [provider]  pioglitazone (ACTOS) 30 MG tablet Take 30 mg by mouth every morning.    Yes [provider]  ramipril (ALTACE) 2.5 MG  capsule Take 2.5 mg by mouth every morning.    Yes [provider]  silodosin (RAPAFLO) 8 MG CAPS capsule Take 8 mg by mouth daily with breakfast.   Yes [provider]  vitamin B-12 (CYANOCOBALAMIN) 1000 MCG tablet Take 1,000 mcg by mouth daily.   Yes [provider]  aspirin 81 MG EC tablet Take 81 mg by mouth daily.     [provider]   Allergies  Allergen Reactions  . No Known Allergies    Review of Systems  Unable to perform ROS: Intubated    Physical Exam Vitals and nursing note reviewed.  Constitutional:      General: He is not in acute distress.    Appearance: He is ill-appearing.     Comments: Appears acutely/chronically ill, intubated   Cardiovascular:     Rate and Rhythm: Normal rate.  Pulmonary:     Comments: Intubated/ventilated Abdominal:     Palpations: Abdomen is soft.     Tenderness: There is no guarding.     Comments: Obese abd   Skin:    General: Skin is warm and dry.  Neurological:     Comments: Intubated/sedated      Vital Signs: BP 99/69   Pulse (!) 101   Temp 98.3 F (36.8 C) (Oral)   Resp 19   Ht 6' 3.98" (1.93 m)   Wt (!) 141.2 kg   SpO2 98%   BMI 37.91 kg/m  Pain Scale: CPOT   Pain Score: 0-No pain   SpO2: SpO2: 98 % O2 Device:SpO2: 98 % O2 Flow Rate: .O2 Flow Rate (L/min): 35 L/min  IO: Intake/output summary:   Intake/Output Summary (Last 24 hours) at 12/22/2018 1108 Last data filed at 12/22/2018 1018 Gross per 24 hour  Intake 3128.73 ml  Output 2745 ml  Net 383.73 ml    LBM: Last BM Date: 12/19/2018 Baseline Weight: Weight: 133.8 kg Most recent weight: Weight: (!) 141.2 kg     Palliative Assessment/Data:   Flowsheet Rows     Most Recent Value  Intake Tab  Referral Department  Hospitalist  Unit at Time of Referral  ICU  Palliative Care Primary Diagnosis  Pulmonary  Date Notified  12/22/18  Palliative Care Type  New Palliative care  Reason for referral  Clarify Goals of Care,  Advance Care Planning  Date of Admission  12/12/2018  Date first seen by Palliative Care  12/22/18  # of days Palliative referral response time  0 Day(s)  # of days IP prior to Palliative referral  5  Clinical Assessment  Palliative Performance Scale Score  10%  Pain Max last 24 hours  Not able to report  Pain Min Last 24 hours  Not able to report  Dyspnea Max Last 24 Hours  Not able to report  Dyspnea Min Last 24 hours  Not able to report  Psychosocial & Spiritual Assessment  Palliative Care Outcomes      Time In: 1310 Time Out: 1420  Time Total: 70 minutes  Greater than 50%  of this time was spent counseling and coordinating care related to the above assessment  and plan.  Signed by: Drue Novel, NP   Please contact Palliative Medicine Team phone at [phone] for questions and concerns.  For individual provider: See Shea Evans

## 2018-12-22 NOTE — Progress Notes (Signed)
*  PRELIMINARY RESULTS* Echocardiogram 2D Echocardiogram has been performed.  Carlos Obrien 12/22/2018, 11:48 AM

## 2018-12-22 NOTE — Consult Note (Addendum)
PHARMACY CONSULT NOTE - FOLLOW UP  Pharmacy Consult for Electrolyte Monitoring and Replacement   Recent Labs: Potassium (mmol/L)  Date Value  12/22/2018 4.6  08/27/2011 4.6   Magnesium (mg/dL)  Date Value  12/22/2018 2.3   Calcium (mg/dL)  Date Value  12/22/2018 7.5 (L)   Calcium, Total (mg/dL)  Date Value  08/27/2011 8.7   Albumin (g/dL)  Date Value  12/22/2018 2.4 (L)   Phosphorus (mg/dL)  Date Value  12/22/2018 2.2 (L)  12/22/2018 2.2 (L)   Sodium (mmol/L)  Date Value  12/22/2018 137  08/27/2011 17    84 year old male patient admitted with pneumonia and hypoxic respiratory failure, and remains intubated in the ICU.  He has a medical history significant forDM with hyperlipidemia, atrial fibrillation, h/o CHF, h/o prostate cancer, h/o CAD.  Pt was started on amiodarone drip for atrial fibrillation with RVR with likely underlying diastolic dysfunction.  CRRT was started in the setting of progressive renal failure.        Electrolytes CRRT resulting in beneficial downtrend for potassium. Corrected Ca 8.3 Goal of Therapy:  --K  4.0 - 5.1   --Mag 2.0 - 2.4 --Phos 2.5 - 3.0 --Give K phos packets x 2 doses, expected to raise K by 0.1.   --Electrolytes with AM labs.  Glucose BG trending down from the 200s. Patient received methylpredinosolone x 2 doses, stopped 12/15. Current regimen: --SSI 0-20 q4 hour --Lantus 26 units QHS --Novolog 6 units q4 hour Last 24h SSI used:  65 units Feeding through tube Diet:  Vital and Prostat starting 12/15.  Constipation Last BM recorded on 12/14  Current regimen:  Scheduled Miralax, Senokot-S Give bisacodyl suppository x1 dose.  Gerald Dexter, PharmD Pharmacy Resident  12/22/2018 8:01 AM

## 2018-12-23 LAB — GLUCOSE, CAPILLARY
Glucose-Capillary: 192 mg/dL — ABNORMAL HIGH (ref 70–99)
Glucose-Capillary: 193 mg/dL — ABNORMAL HIGH (ref 70–99)
Glucose-Capillary: 209 mg/dL — ABNORMAL HIGH (ref 70–99)
Glucose-Capillary: 230 mg/dL — ABNORMAL HIGH (ref 70–99)
Glucose-Capillary: 238 mg/dL — ABNORMAL HIGH (ref 70–99)
Glucose-Capillary: 247 mg/dL — ABNORMAL HIGH (ref 70–99)

## 2018-12-23 LAB — RENAL FUNCTION PANEL
Albumin: 2.3 g/dL — ABNORMAL LOW (ref 3.5–5.0)
Albumin: 2.3 g/dL — ABNORMAL LOW (ref 3.5–5.0)
Anion gap: 9 (ref 5–15)
Anion gap: 10 (ref 5–15)
BUN: 25 mg/dL — ABNORMAL HIGH (ref 8–23)
BUN: 27 mg/dL — ABNORMAL HIGH (ref 8–23)
CO2: 25 mmol/L (ref 22–32)
CO2: 21 mmol/L — ABNORMAL LOW (ref 22–32)
Calcium: 7.8 mg/dL — ABNORMAL LOW (ref 8.9–10.3)
Calcium: 7.9 mg/dL — ABNORMAL LOW (ref 8.9–10.3)
Chloride: 104 mmol/L (ref 98–111)
Chloride: 106 mmol/L (ref 98–111)
Creatinine, Ser: 1.12 mg/dL (ref 0.61–1.24)
Creatinine, Ser: 1.08 mg/dL (ref 0.61–1.24)
GFR calc Af Amer: >60 mL/min (ref >60)
GFR calc Af Amer: >60 mL/min (ref >60)
GFR calc non Af Amer: >60 mL/min (ref >60)
GFR calc non Af Amer: >60 mL/min (ref >60)
Glucose, Bld: 212 mg/dL — ABNORMAL HIGH (ref 70–99)
Glucose, Bld: 250 mg/dL — ABNORMAL HIGH (ref 70–99)
Phosphorus: 1.7 mg/dL — ABNORMAL LOW (ref 2.5–4.6)
Phosphorus: 1.9 mg/dL — ABNORMAL LOW (ref 2.5–4.6)
Potassium: 4.4 mmol/L (ref 3.5–5.1)
Potassium: 5.5 mmol/L — ABNORMAL HIGH (ref 3.5–5.1)
Sodium: 138 mmol/L (ref 135–145)
Sodium: 137 mmol/L (ref 135–145)

## 2018-12-23 LAB — CBC WITH DIFFERENTIAL/PLATELET
Abs Immature Granulocytes: 1.7 10*3/uL — ABNORMAL HIGH (ref 0.00–0.07)
Basophils Absolute: 0.1 10*3/uL (ref 0.0–0.1)
Basophils Relative: 1 %
Eosinophils Absolute: 0.1 10*3/uL (ref 0.0–0.5)
Eosinophils Relative: 1 %
HCT: 24.8 % — ABNORMAL LOW (ref 39.0–52.0)
Hemoglobin: 7.8 g/dL — ABNORMAL LOW (ref 13.0–17.0)
Immature Granulocytes: 15 %
Lymphocytes Relative: 9 %
Lymphs Abs: 1 10*3/uL (ref 0.7–4.0)
MCH: 30.6 pg (ref 26.0–34.0)
MCHC: 31.5 g/dL (ref 30.0–36.0)
MCV: 97.3 fL (ref 80.0–100.0)
Monocytes Absolute: 1.1 10*3/uL — ABNORMAL HIGH (ref 0.1–1.0)
Monocytes Relative: 10 %
Neutro Abs: 7.1 10*3/uL (ref 1.7–7.7)
Neutrophils Relative %: 64 %
Platelets: 107 10*3/uL — ABNORMAL LOW (ref 150–400)
RBC: 2.55 MIL/uL — ABNORMAL LOW (ref 4.22–5.81)
RDW: 21.2 % — ABNORMAL HIGH (ref 11.5–15.5)
Smear Review: NORMAL
WBC: 11.1 10*3/uL — ABNORMAL HIGH (ref 4.0–10.5)
nRBC: 1.4 % — ABNORMAL HIGH (ref 0.0–0.2)

## 2018-12-23 LAB — CULTURE, BLOOD (ROUTINE X 2)
Culture: NO GROWTH
Culture: NO GROWTH
Special Requests: ADEQUATE

## 2018-12-23 LAB — MAGNESIUM: Magnesium: 2.4 mg/dL (ref 1.7–2.4)

## 2018-12-23 MED ORDER — LACTULOSE 10 GM/15ML PO SOLN
30.0000 g | Freq: Four times a day (QID) | ORAL | Status: AC
  Administered 2018-12-23 (×2): 30 g via ORAL
  Filled 2018-12-23 (×2): qty 60

## 2018-12-23 MED ORDER — SODIUM PHOSPHATES 45 MMOLE/15ML IV SOLN
20.0000 mmol | Freq: Once | INTRAVENOUS | Status: AC
  Administered 2018-12-23: 20 mmol via INTRAVENOUS
  Filled 2018-12-23: qty 6.67

## 2018-12-23 MED ORDER — INSULIN GLARGINE 100 UNIT/ML ~~LOC~~ SOLN
32.0000 [IU] | Freq: Every day | SUBCUTANEOUS | Status: DC
  Administered 2018-12-23 – 2018-12-24 (×2): 32 [IU] via SUBCUTANEOUS
  Filled 2018-12-23 (×3): qty 0.32

## 2018-12-23 MED ORDER — ALBUMIN HUMAN 25 % IV SOLN
12.5000 g | Freq: Two times a day (BID) | INTRAVENOUS | Status: AC
  Administered 2018-12-23 – 2018-12-24 (×4): 12.5 g via INTRAVENOUS
  Filled 2018-12-23 (×4): qty 50

## 2018-12-23 NOTE — Progress Notes (Signed)
Follow up - Critical Care Medicine Note  Patient Details:    Carlos Obrien is an 84 y.o. male 84 year old with a history of persistent atrial fibrillation, diabetes mellitus, congestive heart failure and prostate cancer presented on 12/25/2018 with fever and respiratory failure.  Currently intubated and mechanically ventilated.  He has had acute renal failure requiring CRRT and bladder outlet obstruction requiring suprapubic catheter.  Lines, Airways, Drains: Airway 8 mm (Active)  Secured at (cm) 25 cm 12/22/18 0751  Measured From Lips 12/22/18 0751  Secured Location Right 12/22/18 0751  Secured By [employer] 12/22/18 0751  Tube Holder Repositioned Yes 12/22/18 0751  Cuff Pressure (cm H2O) 28 cm H2O 12/22/18 0751  Site Condition Cool;Dry 12/22/18 0751     Closed System Drain 1 Midline Back Accordion (Hemovac) 10 Fr. (Active)     NG/OG Tube Orogastric Center mouth Xray Measured external length of tube 65 cm (Active)  Cm Marking at Nare/Corner of Mouth (if applicable) 65 cm 62/69/48 0745  Site Assessment Clean;Dry;Intact 12/22/18 0745  Ongoing Placement Verification No change in cm markings or external length of tube from initial placement 12/22/18 0745  Status Infusing tube feed 12/22/18 0745  Intake (mL) 120 mL 12/21/18 1600  Output (mL) 0 mL 12/20/18 1600     Suprapubic Catheter 12 Fr. (Active)  Site Assessment Clean;Intact;Dry 12/22/18 0745  Dressing Status Clean;Intact;Dry 12/22/18 0745  Dressing Type Dry dressing 12/22/18 0745  Collection Container Standard drainage bag 12/22/18 0745  Securement Method Sutured 12/22/18 0745  Indication for Insertion or Continuance of Catheter Bladder outlet obstruction / other urologic reason 12/22/18 0745  Output (mL) 0 mL 12/22/18 0800    Anti-infectives:  Anti-infectives (From admission, onward)   Start     Dose/Rate Route Frequency Ordered Stop   12/20/18 2200  ceFEPIme (MAXIPIME) 2 g in sodium chloride 0.9 % 100 mL IVPB      2 g 200 mL/hr over 30 Minutes Intravenous Every 12 hours 12/20/18 1543 12/24/18 2359   12/18/18 1430  azithromycin (ZITHROMAX) tablet 500 mg     500 mg Oral Daily 12/18/18 1419 12/22/18 0914   12/18/18 1115  ceFEPIme (MAXIPIME) 2 g in sodium chloride 0.9 % 100 mL IVPB  Status:  Discontinued     2 g 200 mL/hr over 30 Minutes Intravenous Daily 12/18/18 1103 12/20/18 1543   12/18/18 0830  ceFEPIme (MAXIPIME) 2 g in sodium chloride 0.9 % 100 mL IVPB  Status:  Discontinued    Note to Pharmacy: Dosing per pharmacy   2 g 200 mL/hr over 30 Minutes Intravenous Every 24 hours 12/18/18 0827 12/18/18 0857   12/18/18 0830  vancomycin (VANCOCIN) IVPB 1000 mg/200 mL premix  Status:  Discontinued    Note to Pharmacy: Dosing per phamaryc   1,000 mg 200 mL/hr over 60 Minutes Intravenous Every 12 hours 12/18/18 0828 12/18/18 0857   12/18/18 0115  vancomycin (VANCOCIN) 1,500 mg in sodium chloride 0.9 % 500 mL IVPB     1,500 mg 250 mL/hr over 120 Minutes Intravenous STAT 12/18/18 0047 12/18/18 0416   12/18/18 0030  oseltamivir (TAMIFLU) capsule 75 mg  Status:  Discontinued     75 mg Oral 2 times daily 12/18/18 0024 12/18/18 1011   12/18/18 0030  sulfamethoxazole-trimethoprim (BACTRIM DS) 800-160 MG per tablet 1 tablet  Status:  Discontinued     1 tablet Oral 2 times daily 12/18/18 0027 12/18/18 1227   12/14/2018 2215  ceFEPIme (MAXIPIME) 2 g in sodium chloride 0.9 % 100  mL IVPB     2 g 200 mL/hr over 30 Minutes Intravenous STAT 12/24/2018 2206 12/30/2018 2340   12/23/2018 2145  ceFEPIme (MAXIPIME) 2 g in sodium chloride 0.9 % 100 mL IVPB  Status:  Discontinued     2 g 200 mL/hr over 30 Minutes Intravenous  Once 12/31/2018 2131 12/14/2018 2206   12/15/2018 2145  metroNIDAZOLE (FLAGYL) IVPB 500 mg     500 mg 100 mL/hr over 60 Minutes Intravenous  Once 01/02/2019 2131 12/18/2018 2340   12/27/2018 2145  vancomycin (VANCOCIN) IVPB 1000 mg/200 mL premix     1,000 mg 200 mL/hr over 60 Minutes Intravenous  Once 12/27/2018 2131  12/18/18 0036      Microbiology: Results for orders placed or performed during the hospital encounter of 12/18/2018  Blood Culture (routine x 2)     Status: None   Collection Time: 12/20/2018  9:20 PM   Specimen: BLOOD  Result Value Ref Range Status   Specimen Description BLOOD RIGHT HAND  Final   Special Requests   Final    BOTTLES DRAWN AEROBIC AND ANAEROBIC Blood Culture adequate volume   Culture   Final    NO GROWTH 5 DAYS Performed at [facility], [address], [place], [place] [address]    Report Status 12/22/2018 FINAL  Final  Blood Culture (routine x 2)     Status: None   Collection Time: 12/10/2018  9:29 PM   Specimen: BLOOD  Result Value Ref Range Status   Specimen Description BLOOD RIGHT ANTECUBITAL  Final   Special Requests   Final    BOTTLES DRAWN AEROBIC AND ANAEROBIC Blood Culture adequate volume   Culture   Final    NO GROWTH 5 DAYS Performed at [facility], [address] [place], [place] [address]    Report Status 12/22/2018 FINAL  Final  Respiratory Panel by RT PCR (Flu A&B, Covid) -     Status: None   Collection Time: 12/13/2018 11:41 PM  Result Value Ref Range Status   SARS Coronavirus 2 by RT PCR NEGATIVE NEGATIVE Final    Comment: (NOTE) SARS-CoV-2 target nucleic acids are NOT DETECTED. The SARS-CoV-2 RNA is generally detectable in upper respiratoy specimens during the acute phase of infection. The lowest concentration of SARS-CoV-2 viral copies this assay can detect is 131 copies/mL. A negative result does not preclude SARS-Cov-2 infection and should not be used as the sole basis for treatment or other patient management decisions. A negative result may occur with  improper specimen collection/handling, submission of specimen other than nasopharyngeal swab, presence of viral mutation(s) within the areas targeted by this assay, and inadequate number of viral copies (<131 copies/mL). A negative result must be combined with  clinical observations, patient history, and epidemiological information. The expected result is Negative. Fact Sheet for Patients:  PinkCheek.be Fact Sheet for Healthcare Providers:  GravelBags.it This test is not yet ap proved or cleared by the [place] FDA and  has been authorized for detection and/or diagnosis of SARS-CoV-2 by FDA under an Emergency Use Authorization (EUA). This EUA will remain  in effect (meaning this test can be used) for the duration of the COVID-19 declaration under Section 564(b)(1) of the Act, 21 U.S.C. section 360bbb-3(b)(1), unless the authorization is terminated or revoked sooner.    Influenza A by PCR NEGATIVE NEGATIVE Final   Influenza B by PCR NEGATIVE NEGATIVE Final    Comment: (NOTE) The Xpert Xpress SARS-CoV-2/FLU/RSV assay is intended as an aid in  the diagnosis of influenza from Nasopharyngeal swab specimens and  should not be used as a sole basis for treatment. Nasal washings and  aspirates are unacceptable for Xpert Xpress SARS-CoV-2/FLU/RSV  testing. Fact Sheet for Patients: PinkCheek.be Fact Sheet for Healthcare Providers: GravelBags.it This test is not yet approved or cleared by the [place] FDA and  has been authorized for detection and/or diagnosis of SARS-CoV-2 by  FDA under an Emergency Use Authorization (EUA). This EUA will remain  in effect (meaning this test can be used) for the duration of the  Covid-19 declaration under Section 564(b)(1) of the Act, 21  U.S.C. section 360bbb-3(b)(1), unless the authorization is  terminated or revoked. Performed at [facility], [address] [place], [place] [address]   Urine culture     Status: None   Collection Time: 12/18/18  4:45 AM   Specimen: Urine, Random  Result Value Ref Range Status   Specimen Description   Final    URINE, RANDOM Performed at  [facility], [address], [place], [place] [address]    Special Requests   Final    NONE Performed at [facility], [address], [place], [place] [address]    Culture   Final    NO GROWTH Performed at [facility], [place] [address], [place], [place] [address]    Report Status 12/19/2018 FINAL  Final  CULTURE, BLOOD (ROUTINE X 2) w Reflex to ID Panel     Status: None   Collection Time: 12/18/18  8:40 AM   Specimen: BLOOD  Result Value Ref Range Status   Specimen Description BLOOD BLOOD RIGHT FOREARM  Final   Special Requests   Final    BOTTLES DRAWN AEROBIC AND ANAEROBIC Blood Culture results may not be optimal due to an inadequate volume of blood received in culture bottles   Culture   Final    NO GROWTH 5 DAYS Performed at [facility], [address] [place], [place] [address]    Report Status 12/23/2018 FINAL  Final  CULTURE, BLOOD (ROUTINE X 2) w Reflex to ID Panel     Status: None   Collection Time: 12/18/18  9:02 AM   Specimen: BLOOD  Result Value Ref Range Status   Specimen Description BLOOD BLOOD LEFT ARM  Final   Special Requests   Final    BOTTLES DRAWN AEROBIC AND ANAEROBIC Blood Culture adequate volume   Culture   Final    NO GROWTH 5 DAYS Performed at [facility], [address] [place], [place] [address]    Report Status 12/23/2018 FINAL  Final  MRSA PCR Screening     Status: None   Collection Time: 12/18/18  9:14 AM   Specimen: Nasal Mucosa; Nasopharyngeal  Result Value Ref Range Status   MRSA by PCR NEGATIVE NEGATIVE Final    Comment:        The GeneXpert MRSA Assay (FDA approved for NASAL specimens only), is one component of a comprehensive MRSA colonization surveillance program. It is not intended to diagnose MRSA infection nor to guide or monitor treatment for MRSA infections. Performed at [facility], [address], [place], [place] [address]     Best Practice/Protocols:  VTE  Prophylaxis: Heparin (For CRRT) GI Prophylaxis: Proton Pump Inhibitor Continous Sedation Hyperglycemia (ICU)  Events: 12/14Patient was transferred to ICU for increased WOB and fevers and sepsis HR 170, started on amiodarone 12/15 Intubated, sedated, VASC cath placed, mulitorgan failure, Unable to  place foley catheter by Urology 12/16plan for IR suprapubic catheter placement, remains on vent, severe shock, progressive renal failure 12/18 delirium, this is limiting spontaneous breathing trials, Precedex initiated 12/19 continues to be somewhat encephalopathic/delirious when SAT done however appears more controlled.  Studies: DG Chest 1 View  Result Date: 12/19/2018 CLINICAL DATA:  Shortness of breath. EXAM: CHEST  1 VIEW COMPARISON:  Radiograph 12/22/2018 FINDINGS: Lower lung volumes from prior exam. Cardiomegaly is not significantly changed allowing for differences in technique. Bronchovascular crowding the similar interstitial prominence to prior. No confluent airspace disease. No large pleural effusion. No pneumothorax. Unchanged osseous structures. IMPRESSION: 1. Lower lung volumes from prior exam. Cardiomegaly is grossly stable. 2. Bronchovascular crowding secondary to lower lung volumes with grossly unchanged interstitial prominence. Electronically Signed   By: Keith Rake M.D.   On: 12/19/2018 05:12   [place] Abscess Drain  Result Date: 12/20/2018 INDICATION: 84 year old with acute respiratory failure, sepsis and acute kidney injury. Unable to place Foley catheter due to urethral stricture. Patient needs a suprapubic catheter. EXAM: PLACEMENT OF SUPRAPUBIC CATHETER WITH ULTRASOUND GUIDANCE MEDICATIONS: Inpatient and receiving IV antibiotics. ANESTHESIA/SEDATION: Patient was already intubated and sedated. CONTRAST:  None FLUOROSCOPY TIME:  None COMPLICATIONS: None immediate. PROCEDURE: The anterior lower abdomen and pelvis was evaluated with ultrasound. A distended urinary bladder was  identified. The skin was shaved. The skin was prepped with chlorhexidine and sterile field was created. Maximal barrier sterile technique was utilized including caps, mask, sterile gowns, sterile gloves, sterile drape, hand hygiene and skin antiseptic. Skin was anesthetized with 1% lidocaine. Using ultrasound guidance, an 18 gauge trocar needle was directed into the urinary bladder. Stiff Amplatz wire was advanced into the bladder. The tract was dilated to accommodate a 12 [place] drain. Approximately 500 mL of urine was drained into a gravity bag. Catheter was sutured to skin and secured to skin with a StatLock. FINDINGS: Distended urinary bladder. 12 French drain placed within the bladder. Bladder was decompressed by the end of the procedure. Greater than 500 mL of fluid was immediately removed from the urinary bladder. IMPRESSION: Successful ultrasound-guided suprapubic catheter placement. Electronically Signed   By: Markus Daft M.D.   On: 12/20/2018 16:24   US PELVIS LIMITED (TRANSABDOMINAL ONLY)  Result Date: 12/20/2018 CLINICAL DATA:  84 year old with acute renal failure and unable to place a Foley catheter. Evaluate bladder for suprapubic catheter placement. EXAM: LIMITED ULTRASOUND OF PELVIS TECHNIQUE: Limited transabdominal ultrasound examination of the pelvis was performed. COMPARISON:  Renal ultrasound 12/19/2018 FINDINGS: Fluid in the urinary bladder. Calculated bladder volume is 435 mL. No unexpected structures located between the bladder and the anterior abdominal wall. IMPRESSION: Fluid in the urinary bladder.  Calculated bladder volume is 435 mL. Electronically Signed   By: Markus Daft M.D.   On: 12/20/2018 13:34   [place] Intraoperative  Result Date: 12/20/2018 CLINICAL DATA:  Ultrasound was provided for use by the ordering physician, and a technical charge was applied by the performing facility.  No radiologist interpretation/professional services rendered.   US RENAL  Result Date:  12/19/2018 CLINICAL DATA:  Acute renal failure, history bladder calculi, BPH, coronary artery disease, CHF, diabetes mellitus, hypertension, prostate cancer EXAM: RENAL / URINARY TRACT ULTRASOUND COMPLETE COMPARISON:  Abdomen ultrasound 08/22/2017 FINDINGS: Right Kidney: Renal measurements: 11.0 x 5.4 x 5.8 cm = volume: 180 mL. Degradation of image quality secondary to body habitus and anasarca. Grossly normal cortical thickness and echogenicity. No definite renal mass or hydronephrosis. Left Kidney: Renal measurements: 11.2 x 4.6 x  4.9 cm = volume: 131 mL. Suboptimal visualization due to body habitus and anasarca. No gross evidence of renal mass or hydronephrosis Bladder: Appears normal for degree of bladder distention. Calculated volume 398 mL. Other: N/A IMPRESSION: Suboptimal visualization of the kidneys due to body habitus and anasarca. No definite renal sonographic abnormalities identified. Electronically Signed   By: Lavonia Dana M.D.   On: 12/19/2018 11:18   DG Chest Port 1 View  Result Date: 12/19/2018 CLINICAL DATA:  Encounter for central line placement. EXAM: PORTABLE CHEST 1 VIEW COMPARISON:  Chest x-ray 12/19/2018. FINDINGS: Endotracheal tube, left IJ line, NG tube in stable position. Cardiomegaly. Mild bilateral subsegmental atelectasis and/or infiltrates again noted without interim change. No pleural effusion or pneumothorax. IMPRESSION: 1. Interim placement left IJ line, its tip is over the superior vena cava. Endotracheal tube and NG tube in stable position. 2.  Stable cardiomegaly. 3. Mild bilateral subsegmental atelectasis and or infiltrate again noted without interim change. Electronically Signed   By: Marcello Moores  Register   On: 12/19/2018 09:26   DG Chest Port 1 View  Result Date: 12/19/2018 CLINICAL DATA:  Status post intubation. EXAM: PORTABLE CHEST 1 VIEW COMPARISON:  Same day. FINDINGS: Stable cardiomediastinal silhouette. Endotracheal and nasogastric tubes are in grossly good  position. No pneumothorax is noted. No significant pleural effusion is noted. Left upper lobe linear opacities are noted concerning for atelectasis or possibly infiltrates. Bony thorax is unremarkable. IMPRESSION: Endotracheal and nasogastric tubes are in grossly good position. Left upper lobe linear opacities are noted concerning for atelectasis or possibly infiltrates. Follow-up radiographs are recommended. Electronically Signed   By: Marijo Conception M.D.   On: 12/19/2018 08:23   DG Chest Port 1 View  Result Date: 12/10/2018 CLINICAL DATA:  Initial evaluation for acute fever, cough, congestion. EXAM: PORTABLE CHEST 1 VIEW COMPARISON:  Prior CT from 06/20/2017 FINDINGS: Mild cardiomegaly. Mediastinal silhouette within normal limits. Lungs hypoinflated. Mild diffuse interstitial prominence, which could reflect interstitial congestion and/or atypical pneumonitis. No consolidative opacity identified. No frank alveolar edema. No pleural effusion. No pneumothorax. No acute osseous finding. IMPRESSION: 1. Mild diffuse interstitial prominence, which could reflect interstitial congestion and/or atypical pneumonitis. No consolidative opacity identified. 2. Mild cardiomegaly without frank edema. Electronically Signed   By: Jeannine Boga M.D.   On: 12/12/2018 21:50   DG Abd Portable 1V  Result Date: 12/19/2018 CLINICAL DATA:  NG tube placement EXAM: PORTABLE ABDOMEN - 1 VIEW COMPARISON:  12/19/2018 FINDINGS: NG tube projects over the mid stomach body. Mild gaseous distention of the colon and small bowel in the left abdomen. No definite obstruction pattern. No abnormal calcifications. Degenerative changes of the spine. IMPRESSION: NG tube within the mid stomach body.  Nonspecific bowel gas pattern. Electronically Signed   By: Jerilynn Mages.  Shick M.D.   On: 12/19/2018 08:23   ECHOCARDIOGRAM COMPLETE  Result Date: 12/22/2018   ECHOCARDIOGRAM REPORT   Patient Name:   Carlos Obrien Date of Exam: 12/22/2018 Medical Rec  #:  [id number]     Height: Accession #:    [id number]    Weight: Date of Birth:  August 25, 1934     BSA: Patient Age:    63 years      BP:           95/48 mmHg Patient Gender: M             HR:           89 bpm. Exam Location:  [facility] Procedure: 2D Echo, Cardiac Doppler  and Color Doppler Indications:     Bacteremia 790.7  History:         Patient has no prior history of Echocardiogram examinations.                  Risk Factors:Diabetes. PAF.  Sonographer:     Sherrie Sport RDCS (AE) Referring Phys:  [id number] Arm[address] Foglio Veda Canning Diagnosing Phys: Ida Rogue MD  Sonographer Comments: Technically difficult study due to poor echo windows and echo performed with patient supine and on artificial respirator. Pt was getting dialysis during echo- echo performed from the right side of pt bed. IMPRESSIONS  1. Left ventricular ejection fraction, by visual estimation, is 60 to 65%. The left ventricle has normal function. There is no left ventricular hypertrophy.  2. Left ventricular diastolic parameters are indeterminate.  3. The left ventricle has no regional wall motion abnormalities.  4. Global right ventricle has normal systolic function.The right ventricular size is normal. No increase in right ventricular wall thickness.  5. Left atrial size was mildly dilated.  6. Mildly elevated pulmonary artery systolic pressure.  7. No valve vegetation noted. FINDINGS  Left Ventricle: Left ventricular ejection fraction, by visual estimation, is 60 to 65%. The left ventricle has normal function. The left ventricle has no regional wall motion abnormalities. There is no left ventricular hypertrophy. Left ventricular diastolic parameters are indeterminate. Normal left atrial pressure. Right Ventricle: The right ventricular size is normal. No increase in right ventricular wall thickness. Global RV systolic function is has normal systolic function. The tricuspid regurgitant velocity is 2.81 m/s, and with an assumed right atrial pressure  of 5 mmHg, the  estimated right ventricular systolic pressure is mildly elevated at 36.6 mmHg. Left Atrium: Left atrial size was mildly dilated. Right Atrium: Right atrial size was normal in size Pericardium: There is no evidence of pericardial effusion. Mitral Valve: The mitral valve is normal in structure. No evidence of mitral valve regurgitation. No evidence of mitral valve stenosis by observation. Tricuspid Valve: The tricuspid valve is normal in structure. Tricuspid valve regurgitation is mild. Aortic Valve: The aortic valve is normal in structure. Aortic valve regurgitation is not visualized. The aortic valve is structurally normal, with no evidence of sclerosis or stenosis. Pulmonic Valve: The pulmonic valve was normal in structure. Pulmonic valve regurgitation is not visualized. Pulmonic regurgitation is not visualized. Aorta: The aortic root, ascending aorta and aortic arch are all structurally normal, with no evidence of dilitation or obstruction. Venous: The inferior vena cava is normal in size with greater than 50% respiratory variability, suggesting right atrial pressure of 3 mmHg. IAS/Shunts: No atrial level shunt detected by color flow Doppler. There is no evidence of a patent foramen ovale. No ventricular septal defect is seen or detected. There is no evidence of an atrial septal defect.  TRICUSPID VALVE TR Peak grad:   31.6 mmHg TR Vmax:        281.00 cm/s  Ida Rogue MD Electronically signed by Ida Rogue MD Signature Date/Time: 12/22/2018/12:24:36 PM    Final     Consults: Treatment Team:  Flora Lipps, MD Abbie Sons, MD   Subjective:    Overnight Issues: No overnight issues, agitated when sedation is lightened, however better controlled.  Significant anasarca but improved from yesterday.  Remains with CVR on amiodarone  Objective:  Vital signs for last 24 hours: Temp:  [98.4 F (36.9 C)-99.9 F (37.7 C)] 99.5 F (37.5 C) (12/19 1200) Pulse Rate:  [72-105] 80 (12/19 1300)  Resp:   [10-34] 21 (12/19 1300) BP: (76-136)/(44-69) 96/50 (12/19 1300) SpO2:  [95 %-100 %] 95 % (12/19 1300) FiO2 (%):  [28 %-30 %] 28 % (12/19 1236) Weight:  [139.9 kg] 139.9 kg (12/19 0256)  Hemodynamic parameters for last 24 hours:    Intake/Output from previous day: 12/18 0701 - 12/19 0700 In: 2748.9 [I.V.:1219.8; NG/GT:1290; IV Piggyback:239.1] Out: 3878 [Urine:100]  Intake/Output this shift: Total I/O In: 865.7 [I.V.:321.1; NG/GT:445; IV Piggyback:99.6] Out: 1074 [Urine:50; Other:1024]  Vent settings for last 24 hours: Vent Mode: PRVC FiO2 (%):  [28 %-30 %] 28 % Set Rate:  [16 bmp] 16 bmp Vt Set:  [550 mL] 550 mL PEEP:  [5 cmH20] 5 cmH20 Plateau Pressure:  [18 cmH20] 18 cmH20  Physical Exam:  GENERAL: Obese gentleman, sedated, intubated and mechanically ventilated.  Wake-up assessment resulted in mild agitation.  Following commands HEAD: Normocephalic, atraumatic.  EYES: Pupils equal, round, reactive to light.  No scleral icterus.  MOUTH: Orotracheally intubated, OG in place NECK: Supple.  Trachea midline PULMONARY: Coarse breath sounds, no wheezes CARDIOVASCULAR: S1 and S2.  IRR, CVR. No murmurs, rubs, or gallops.  GASTROINTESTINAL: Soft, non-distended. Positive bowel sounds.  MUSCULOSKELETAL: Anasarca 2+, no joint deformity NEUROLOGIC: Sedated, mildly agitated when sedation is lightened.  Not following commands SKIN:anasarca,2+, blistering of UE skin due to edema is improving, thin skin, multiple echymotic areas particularly in upper extremities   Assessment/Plan:    Acute hypoxic respiratory failure, ventilator dependent Pneumonia, community-acquired NO ORGANISM SPECIFIED Volume overload due to acute renal failure Continue ventilator support SAT and SBT as tolerated Continue cefepime Continue bronchodilator therapy for mucociliary clearance Volume overload management with hemofiltration in CRRT  Acute on chronic renal failure with attendant electrolyte  imbalance Hyperkalemia Metabolic acidosis Baseline CKD IIIb with creatinine 1.6, EGFR 42 Bladder outlet obstruction, no hydronephrosis Continue CRRT per renal Continue suprapubic catheter drainage Electrolyte management per renal (CRRT) and pharmacy Avoid nephrotoxins Careful dosing of all medications and antibiotics  Severe sepsis Septic shock-resolved Community-acquired pneumonia, NO ORGANISM SPECIFIED Continue cefepime completed azithromycin, cefepime total of 7 days Cultures have been NEGATIVE (no Strep pyogenes bacteremia identified)  Toxic metabolic encephalopathy with delirium due to the above Ventilator discomfort Continue fentanyl infusion for ventilator discomfort Initiated Precedex due to agitation, tolerating, agitation appears better  Persistent atrial fibrillation RVR now CVR Continue amiodarone transition to via OG as tolerated On heparin for CRRT  Nutrition Tube feeds as tolerated Albumin supplementation will give total of 4 doses Bowel regimen as indicated  Diabetes mellitus with hyperglycemia due to physiologic stress Close CBG monitoring ICU hyperglycemia protocol  Skin care Significant subcutaneous tissue edema, fluid removal via CRRT Albumin supplementation to aid and reducing third spacing Wrap upper extremities to absorb fluid from blisters  EOL issues Patient will be DNR Continue current management but no resuscitation in the event of cardiac arrest   LOS: 5 days   Additional comments: Care coordination performed with bedside nurse  Critical Care Total Time*: 40 Minutes  C. Derrill Kay, MD [facility] 12/23/2018  *Care during the described time interval was provided by me and/or other providers on the critical care team.  I have reviewed this patient's available data, including medical history, events of note, physical examination and test results as part of my evaluation.  **This note was dictated using voice recognition  software/Dragon.  Despite best efforts to proofread, errors can occur which can change the meaning.  Any change was purely unintentional.

## 2018-12-23 NOTE — Progress Notes (Signed)
CRRT clotted at 1900 and restarted at 2100 . Infusing without any difficulty. Pt wife was updated 2 times this shift.

## 2018-12-23 NOTE — Consult Note (Signed)
PHARMACY CONSULT NOTE - FOLLOW UP  Pharmacy Consult for Electrolyte Monitoring and Replacement   Recent Labs: Potassium (mmol/L)  Date Value  12/23/2018 4.4  08/27/2011 4.6   Magnesium (mg/dL)  Date Value  12/23/2018 2.4   Calcium (mg/dL)  Date Value  12/23/2018 7.8 (L)   Calcium, Total (mg/dL)  Date Value  08/27/2011 8.7   Albumin (g/dL)  Date Value  12/23/2018 2.3 (L)   Phosphorus (mg/dL)  Date Value  12/23/2018 1.7 (L)   Sodium (mmol/L)  Date Value  12/23/2018 138  08/27/2011 42    84 year old male patient admitted with pneumonia and hypoxic respiratory failure, and remains intubated in the ICU.  He has a medical history significant forDM with hyperlipidemia, atrial fibrillation, h/o CHF, h/o prostate cancer, h/o CAD.  Pt was started on amiodarone drip for atrial fibrillation with RVR with likely underlying diastolic dysfunction.  CRRT was started in the setting of progressive renal failure.        Electrolytes Sodium phos 49mmol IV x 1.  Electrolytes with am labs.   Glucose 47 units of SS coverage in last 24 hours.Tube feeding coverage of 6 units Q4hr, Lantus 26 units Qhs. Increase Lantus to 32 units Qhs and continue to monitor.   Constipation Last BM recorded on 12/14  Continue Senna/Docusate 2 tabs BID and Miralax VT Daily. Lactulose 30mg  VT Q6hr x 2 doses.   Pharmacy will continue to monitor and adjust per consult.   Saladin Petrelli L, RPh  12/23/2018 2:25 PM

## 2018-12-23 NOTE — Progress Notes (Signed)
Carlos Obrien  MRN: [id number]  DOB/AGE: June 18, 1934 84 y.o.  Primary Care Physician:Sparks, Leonie Douglas, MD  Admit date: 12/25/2018  Chief Complaint:  Chief Complaint  Patient presents with  . Weakness  . Fall    S-Pt presented on  12/29/2018 with  Chief Complaint  Patient presents with  . Weakness  . Fall  . Patient remains intubated unable to offer any complaints  Medications . sodium chloride   Intravenous Once  . aspirin  81 mg Per Tube Daily  . atorvastatin  20 mg Oral QPM  . budesonide (PULMICORT) nebulizer solution  0.5 mg Nebulization BID  . chlorhexidine gluconate (MEDLINE KIT)  15 mL Mouth Rinse BID  . Chlorhexidine Gluconate Cloth  6 each Topical Daily  . feeding supplement (PRO-STAT SUGAR FREE 64)  60 mL Per Tube TID  . heparin injection (subcutaneous)  5,000 Units Subcutaneous Q8H  . insulin aspart  0-20 Units Subcutaneous Q4H  . insulin aspart  6 Units Subcutaneous Q4H  . insulin glargine  26 Units Subcutaneous QHS  . ipratropium-albuterol  3 mL Nebulization Q4H  . mouth rinse  15 mL Mouth Rinse 10 times per day  . multivitamin  15 mL Per Tube Daily  . pantoprazole sodium  40 mg Per Tube Daily  . polyethylene glycol  17 g Per Tube Daily  . senna-docusate  2 tablet Per Tube BID         ROS: Unable to get any data as patient is intubated/sedated  Physical Exam: Vital signs in last 24 hours: Temp:  [98.4 F (36.9 C)-99.9 F (37.7 C)] 98.4 F (36.9 C) (12/19 0800) Pulse Rate:  [72-108] 72 (12/19 1100) Resp:  [10-34] 18 (12/19 1100) BP: (73-136)/(44-69) 98/52 (12/19 1100) SpO2:  [98 %-100 %] 100 % (12/19 1100) FiO2 (%):  [30 %] 30 % (12/19 0800) Weight:  [139.9 kg] 139.9 kg (12/19 0256) Weight change:  Last BM Date: 01/03/2019  Intake/Output from previous day: 12/18 0701 - 12/19 0700 In: 2748.9 [I.V.:1219.8; NG/GT:1290; IV Piggyback:239.1] Out: 3878 [Urine:100] Total I/O In: 659.4 [I.V.:214.7; NG/GT:345; IV Piggyback:99.6] Out: 660  [Urine:20; Other:640]   Physical Exam: General- pt is sedated/intubated\ HEENT ET tube in situ, OG tube in situ Resp-, patient has bilateral breath sounds, patient has rhonchi CVS- S1S2 irregular in rate and rhythm GIT- BS+, soft, NT, ND EXT- 1+UE and  LE Edema,no Cyanosis   Lab Results: CBC Recent Labs    12/21/18 0429 12/23/18 0702  WBC 10.1 11.1*  HGB 8.7* 7.8*  HCT 28.3* 24.8*  PLT 118* 107*    BMET Recent Labs    12/22/18 1630 12/23/18 0702  NA 135 138  K 4.7 4.4  CL 103 104  CO2 25 25  GLUCOSE 270* 212*  BUN 28* 25*  CREATININE 1.38* 1.12  CALCIUM 7.8* 7.8*    Creatinine trend 20202.3==>4.3 now on CRRT 2018 1.81 2013 1.92  MICRO Recent Results (from the past 240 hour(s))  Blood Culture (routine x 2)     Status: None   Collection Time: 12/28/2018  9:20 PM   Specimen: BLOOD  Result Value Ref Range Status   Specimen Description BLOOD RIGHT HAND  Final   Special Requests   Final    BOTTLES DRAWN AEROBIC AND ANAEROBIC Blood Culture adequate volume   Culture   Final    NO GROWTH 5 DAYS Performed at [facility], [address] [place], [place] [address]    Report Status 12/22/2018 FINAL  Final  Blood  Culture (routine x 2)     Status: None   Collection Time: 12/14/2018  9:29 PM   Specimen: BLOOD  Result Value Ref Range Status   Specimen Description BLOOD RIGHT ANTECUBITAL  Final   Special Requests   Final    BOTTLES DRAWN AEROBIC AND ANAEROBIC Blood Culture adequate volume   Culture   Final    NO GROWTH 5 DAYS Performed at [facility], [address] [place], [place] [address]    Report Status 12/22/2018 FINAL  Final  Respiratory Panel by RT PCR (Flu A&B, Covid) -     Status: None   Collection Time: 12/31/2018 11:41 PM  Result Value Ref Range Status   SARS Coronavirus 2 by RT PCR NEGATIVE NEGATIVE Final    Comment: (NOTE) SARS-CoV-2 target nucleic acids are NOT DETECTED. The SARS-CoV-2 RNA is generally detectable in upper  respiratoy specimens during the acute phase of infection. The lowest concentration of SARS-CoV-2 viral copies this assay can detect is 131 copies/mL. A negative result does not preclude SARS-Cov-2 infection and should not be used as the sole basis for treatment or other patient management decisions. A negative result may occur with  improper specimen collection/handling, submission of specimen other than nasopharyngeal swab, presence of viral mutation(s) within the areas targeted by this assay, and inadequate number of viral copies (<131 copies/mL). A negative result must be combined with clinical observations, patient history, and epidemiological information. The expected result is Negative. Fact Sheet for Patients:  PinkCheek.be Fact Sheet for Healthcare Providers:  GravelBags.it This test is not yet ap proved or cleared by the [place] FDA and  has been authorized for detection and/or diagnosis of SARS-CoV-2 by FDA under an Emergency Use Authorization (EUA). This EUA will remain  in effect (meaning this test can be used) for the duration of the COVID-19 declaration under Section 564(b)(1) of the Act, 21 U.S.C. section 360bbb-3(b)(1), unless the authorization is terminated or revoked sooner.    Influenza A by PCR NEGATIVE NEGATIVE Final   Influenza B by PCR NEGATIVE NEGATIVE Final    Comment: (NOTE) The Xpert Xpress SARS-CoV-2/FLU/RSV assay is intended as an aid in  the diagnosis of influenza from Nasopharyngeal swab specimens and  should not be used as a sole basis for treatment. Nasal washings and  aspirates are unacceptable for Xpert Xpress SARS-CoV-2/FLU/RSV  testing. Fact Sheet for Patients: PinkCheek.be Fact Sheet for Healthcare Providers: GravelBags.it This test is not yet approved or cleared by the [place] FDA and  has been authorized for  detection and/or diagnosis of SARS-CoV-2 by  FDA under an Emergency Use Authorization (EUA). This EUA will remain  in effect (meaning this test can be used) for the duration of the  Covid-19 declaration under Section 564(b)(1) of the Act, 21  U.S.C. section 360bbb-3(b)(1), unless the authorization is  terminated or revoked. Performed at [facility], [address], [place], [place] [address]   Urine culture     Status: None   Collection Time: 12/18/18  4:45 AM   Specimen: Urine, Random  Result Value Ref Range Status   Specimen Description   Final    URINE, RANDOM Performed at [facility], [address] [place], [place] [address]    Special Requests   Final    NONE Performed at [facility], [address], [place], [place] [address]    Culture   Final    NO GROWTH Performed at [facility], [place]  [address], [place], [place] [address]    Report Status 12/19/2018 FINAL  Final  CULTURE, BLOOD (ROUTINE X 2) w Reflex to ID Panel     Status: None   Collection Time: 12/18/18  8:40 AM   Specimen: BLOOD  Result Value Ref Range Status   Specimen Description BLOOD BLOOD RIGHT FOREARM  Final   Special Requests   Final    BOTTLES DRAWN AEROBIC AND ANAEROBIC Blood Culture results may not be optimal due to an inadequate volume of blood received in culture bottles   Culture   Final    NO GROWTH 5 DAYS Performed at [facility], [address] [place], [place] [address]    Report Status 12/23/2018 FINAL  Final  CULTURE, BLOOD (ROUTINE X 2) w Reflex to ID Panel     Status: None   Collection Time: 12/18/18  9:02 AM   Specimen: BLOOD  Result Value Ref Range Status   Specimen Description BLOOD BLOOD LEFT ARM  Final   Special Requests   Final    BOTTLES DRAWN AEROBIC AND ANAEROBIC Blood Culture adequate volume   Culture   Final    NO GROWTH 5 DAYS Performed at [facility], [address] [place], [place] [address]    Report  Status 12/23/2018 FINAL  Final  MRSA PCR Screening     Status: None   Collection Time: 12/18/18  9:14 AM   Specimen: Nasal Mucosa; Nasopharyngeal  Result Value Ref Range Status   MRSA by PCR NEGATIVE NEGATIVE Final    Comment:        The GeneXpert MRSA Assay (FDA approved for NASAL specimens only), is one component of a comprehensive MRSA colonization surveillance program. It is not intended to diagnose MRSA infection nor to guide or monitor treatment for MRSA infections. Performed at [facility], [address], [place], [place] [address]       Lab Results  Component Value Date   CALCIUM 7.8 (L) 12/23/2018   PHOS 1.7 (L) 12/23/2018               Impression:   84 y.o. male with a PMHx ofAnemia of chronic kidney disease, bilateral lower extremity edema, nephrolithiasis, BPH, chronic kidney disease stage IIIb baseline creatinine 1.6 EGFR 42, coronary artery disease, degenerative disc disease, diabetes mellitus type 2, chronic diastolic heart failure, GERD, Barrett's esophagus, hyperlipidemia, hypertension, paroxysmal atrial fibrillation, prostate cancer, urethral stricture, who was admitted to [facility] on 12/13/2020for evaluation of significant shortness of breath.     1)Renal  AKI secondary to ATN Patient had acute kidney injury secondary to septic shock Patient had AKI on CKD Patient has CKD stage IIIb is at baseline  Patient has CKD since 2013. Patient CKD is most likely secondary to obstructive uropathy though there could be contribution from long-term history of hypertension as well as age associated decline as patient is 84 years old  .  Patient was initiated on CRRT on 12/20/2018 Patient remains oliguric We will continue patient on CRRT  2) hypotension Patient is on vasopressors  3)Anemia of chronic disease Hemoglobin is trending down   4) hyperkalemia  Patient is potassium is now much better after CRRT   5) acute respiratory  failure Remains intubated Patient is being closely followed by the pulmonary/critical care team  6) atrial fibrillation Patient is on amiodarone drip   Plan:   We will continue the current CRRT We will continue to follow the electrolytes    Prestin Munch s [facility] 12/23/2018,  11:40 AM

## 2018-12-24 LAB — CBC WITH DIFFERENTIAL/PLATELET
Abs Immature Granulocytes: 1.8 10*3/uL — ABNORMAL HIGH (ref 0.00–0.07)
Band Neutrophils: 10 %
Basophils Absolute: 0 10*3/uL (ref 0.0–0.1)
Basophils Relative: 0 %
Eosinophils Absolute: 0.3 10*3/uL (ref 0.0–0.5)
Eosinophils Relative: 2 %
HCT: 26.5 % — ABNORMAL LOW (ref 39.0–52.0)
Hemoglobin: 8.5 g/dL — ABNORMAL LOW (ref 13.0–17.0)
Lymphocytes Relative: 7 %
Lymphs Abs: 1.2 10*3/uL (ref 0.7–4.0)
MCH: 30.1 pg (ref 26.0–34.0)
MCHC: 32.1 g/dL (ref 30.0–36.0)
MCV: 94 fL (ref 80.0–100.0)
Metamyelocytes Relative: 7 %
Monocytes Absolute: 0.5 10*3/uL (ref 0.1–1.0)
Monocytes Relative: 3 %
Myelocytes: 4 %
Neutro Abs: 12.9 10*3/uL — ABNORMAL HIGH (ref 1.7–7.7)
Neutrophils Relative %: 67 %
Platelets: 121 10*3/uL — ABNORMAL LOW (ref 150–400)
RBC: 2.82 MIL/uL — ABNORMAL LOW (ref 4.22–5.81)
RDW: 21.5 % — ABNORMAL HIGH (ref 11.5–15.5)
WBC: 16.7 10*3/uL — ABNORMAL HIGH (ref 4.0–10.5)
nRBC: 1.5 % — ABNORMAL HIGH (ref 0.0–0.2)
nRBC: 2 /100 WBC — ABNORMAL HIGH

## 2018-12-24 LAB — RENAL FUNCTION PANEL
Albumin: 2.9 g/dL — ABNORMAL LOW (ref 3.5–5.0)
Albumin: 3.4 g/dL — ABNORMAL LOW (ref 3.5–5.0)
Anion gap: 10 (ref 5–15)
Anion gap: 10 (ref 5–15)
BUN: 28 mg/dL — ABNORMAL HIGH (ref 8–23)
BUN: 29 mg/dL — ABNORMAL HIGH (ref 8–23)
CO2: 27 mmol/L (ref 22–32)
CO2: 26 mmol/L (ref 22–32)
Calcium: 8.1 mg/dL — ABNORMAL LOW (ref 8.9–10.3)
Calcium: 8.3 mg/dL — ABNORMAL LOW (ref 8.9–10.3)
Chloride: 102 mmol/L (ref 98–111)
Chloride: 101 mmol/L (ref 98–111)
Creatinine, Ser: 1.32 mg/dL — ABNORMAL HIGH (ref 0.61–1.24)
Creatinine, Ser: 1.23 mg/dL (ref 0.61–1.24)
GFR calc Af Amer: 58 mL/min — ABNORMAL LOW (ref >60)
GFR calc Af Amer: >60 mL/min (ref >60)
GFR calc non Af Amer: 50 mL/min — ABNORMAL LOW (ref >60)
GFR calc non Af Amer: 54 mL/min — ABNORMAL LOW (ref >60)
Glucose, Bld: 217 mg/dL — ABNORMAL HIGH (ref 70–99)
Glucose, Bld: 219 mg/dL — ABNORMAL HIGH (ref 70–99)
Phosphorus: 2.5 mg/dL (ref 2.5–4.6)
Phosphorus: 2.4 mg/dL — ABNORMAL LOW (ref 2.5–4.6)
Potassium: 4.5 mmol/L (ref 3.5–5.1)
Potassium: 4.8 mmol/L (ref 3.5–5.1)
Sodium: 139 mmol/L (ref 135–145)
Sodium: 137 mmol/L (ref 135–145)

## 2018-12-24 LAB — GLUCOSE, CAPILLARY
Glucose-Capillary: 158 mg/dL — ABNORMAL HIGH (ref 70–99)
Glucose-Capillary: 193 mg/dL — ABNORMAL HIGH (ref 70–99)
Glucose-Capillary: 200 mg/dL — ABNORMAL HIGH (ref 70–99)
Glucose-Capillary: 202 mg/dL — ABNORMAL HIGH (ref 70–99)
Glucose-Capillary: 205 mg/dL — ABNORMAL HIGH (ref 70–99)
Glucose-Capillary: 244 mg/dL — ABNORMAL HIGH (ref 70–99)

## 2018-12-24 LAB — MAGNESIUM: Magnesium: 2.6 mg/dL — ABNORMAL HIGH (ref 1.7–2.4)

## 2018-12-24 MED ORDER — LACTULOSE 10 GM/15ML PO SOLN
30.0000 g | Freq: Three times a day (TID) | ORAL | Status: DC
  Administered 2018-12-24 – 2018-12-25 (×5): 30 g
  Filled 2018-12-24 (×5): qty 60

## 2018-12-24 MED ORDER — ATORVASTATIN CALCIUM 20 MG PO TABS
20.0000 mg | ORAL_TABLET | Freq: Every evening | ORAL | Status: DC
  Administered 2018-12-24 – 2018-12-25 (×2): 20 mg
  Filled 2018-12-24 (×2): qty 1

## 2018-12-24 MED ORDER — INSULIN ASPART 100 UNIT/ML ~~LOC~~ SOLN
7.0000 [IU] | SUBCUTANEOUS | Status: DC
  Administered 2018-12-24 – 2018-12-25 (×5): 7 [IU] via SUBCUTANEOUS
  Filled 2018-12-24 (×6): qty 1

## 2018-12-24 MED ORDER — ACETAMINOPHEN 325 MG PO TABS
650.0000 mg | ORAL_TABLET | Freq: Four times a day (QID) | ORAL | Status: DC | PRN
  Administered 2018-12-24: 650 mg
  Filled 2018-12-24: qty 2

## 2018-12-24 NOTE — Consult Note (Addendum)
PHARMACY CONSULT NOTE - FOLLOW UP  Pharmacy Consult for Electrolyte Monitoring and Replacement   Recent Labs: Potassium (mmol/L)  Date Value  12/24/2018 4.5  08/27/2011 4.6   Magnesium (mg/dL)  Date Value  12/24/2018 2.6 (H)   Calcium (mg/dL)  Date Value  12/24/2018 8.1 (L)   Calcium, Total (mg/dL)  Date Value  08/27/2011 8.7   Albumin (g/dL)  Date Value  12/24/2018 2.9 (L)   Phosphorus (mg/dL)  Date Value  12/24/2018 2.5   Sodium (mmol/L)  Date Value  12/24/2018 139  08/27/2011 59    84 year old male patient admitted with pneumonia and hypoxic respiratory failure, and remains intubated in the ICU.  He has a medical history significant forDM with hyperlipidemia, atrial fibrillation, h/o CHF, h/o prostate cancer, h/o CAD.  Pt was started on amiodarone drip for atrial fibrillation with RVR with likely underlying diastolic dysfunction.  CRRT was started in the setting of progressive renal failure.        Electrolytes No replacement warranted  Electrolytes with am labs.   Glucose 33 units of SS coverage in last 24 hours.Tube feeding coverage of 6 units Q4hr, Lantus 32 units Qhs. Increase tube feed coverage to 7 units and continue to monitor.   Constipation Last BM recorded on 12/13  Continue Senna/Docusate 2 tabs BID and Miralax VT Daily. Patient ordered Lactulose 30mg  VT Q6hr x 2 doses on 12/19. Will continue lactulose 30mg  VT TID.   Pharmacy will continue to monitor and adjust per consult.   Stormey Wilborn L, RPh  12/24/2018 8:51 AM

## 2018-12-24 NOTE — Progress Notes (Addendum)
Follow up - Critical Care Medicine Note  Patient Details:    Carlos Obrien is an 84 y.o. male 84 year old with a history of persistent atrial fibrillation, diabetes mellitus, congestive heart failure and prostate cancer presented on 12/07/2018 with fever and respiratory failure.  Currently intubated and mechanically ventilated.  He has had acute renal failure requiring CRRT and bladder outlet obstruction requiring suprapubic catheter.  Lines, Airways, Drains: Airway 8 mm (Active)  Secured at (cm) 25 cm 12/22/18 0751  Measured From Lips 12/22/18 0751  Secured Location Right 12/22/18 0751  Secured By [employer] 12/22/18 0751  Tube Holder Repositioned Yes 12/22/18 0751  Cuff Pressure (cm H2O) 28 cm H2O 12/22/18 0751  Site Condition Cool;Dry 12/22/18 0751     Closed System Drain 1 Midline Back Accordion (Hemovac) 10 Fr. (Active)     NG/OG Tube Orogastric Center mouth Xray Measured external length of tube 65 cm (Active)  Cm Marking at Nare/Corner of Mouth (if applicable) 65 cm 62/69/48 0745  Site Assessment Clean;Dry;Intact 12/22/18 0745  Ongoing Placement Verification No change in cm markings or external length of tube from initial placement 12/22/18 0745  Status Infusing tube feed 12/22/18 0745  Intake (mL) 120 mL 12/21/18 1600  Output (mL) 0 mL 12/20/18 1600     Suprapubic Catheter 12 Fr. (Active)  Site Assessment Clean;Intact;Dry 12/22/18 0745  Dressing Status Clean;Intact;Dry 12/22/18 0745  Dressing Type Dry dressing 12/22/18 0745  Collection Container Standard drainage bag 12/22/18 0745  Securement Method Sutured 12/22/18 0745  Indication for Insertion or Continuance of Catheter Bladder outlet obstruction / other urologic reason 12/22/18 0745  Output (mL) 0 mL 12/22/18 0800    Anti-infectives:  Anti-infectives (From admission, onward)   Start     Dose/Rate Route Frequency Ordered Stop   12/20/18 2200  ceFEPIme (MAXIPIME) 2 g in sodium chloride 0.9 % 100 mL IVPB      2 g 200 mL/hr over 30 Minutes Intravenous Every 12 hours 12/20/18 1543 12/24/18 2359   12/18/18 1430  azithromycin (ZITHROMAX) tablet 500 mg     500 mg Oral Daily 12/18/18 1419 12/22/18 0914   12/18/18 1115  ceFEPIme (MAXIPIME) 2 g in sodium chloride 0.9 % 100 mL IVPB  Status:  Discontinued     2 g 200 mL/hr over 30 Minutes Intravenous Daily 12/18/18 1103 12/20/18 1543   12/18/18 0830  ceFEPIme (MAXIPIME) 2 g in sodium chloride 0.9 % 100 mL IVPB  Status:  Discontinued    Note to Pharmacy: Dosing per pharmacy   2 g 200 mL/hr over 30 Minutes Intravenous Every 24 hours 12/18/18 0827 12/18/18 0857   12/18/18 0830  vancomycin (VANCOCIN) IVPB 1000 mg/200 mL premix  Status:  Discontinued    Note to Pharmacy: Dosing per phamaryc   1,000 mg 200 mL/hr over 60 Minutes Intravenous Every 12 hours 12/18/18 0828 12/18/18 0857   12/18/18 0115  vancomycin (VANCOCIN) 1,500 mg in sodium chloride 0.9 % 500 mL IVPB     1,500 mg 250 mL/hr over 120 Minutes Intravenous STAT 12/18/18 0047 12/18/18 0416   12/18/18 0030  oseltamivir (TAMIFLU) capsule 75 mg  Status:  Discontinued     75 mg Oral 2 times daily 12/18/18 0024 12/18/18 1011   12/18/18 0030  sulfamethoxazole-trimethoprim (BACTRIM DS) 800-160 MG per tablet 1 tablet  Status:  Discontinued     1 tablet Oral 2 times daily 12/18/18 0027 12/18/18 1227   12/07/2018 2215  ceFEPIme (MAXIPIME) 2 g in sodium chloride 0.9 % 100  mL IVPB     2 g 200 mL/hr over 30 Minutes Intravenous STAT 12/24/2018 2206 12/30/2018 2340   12/23/2018 2145  ceFEPIme (MAXIPIME) 2 g in sodium chloride 0.9 % 100 mL IVPB  Status:  Discontinued     2 g 200 mL/hr over 30 Minutes Intravenous  Once 12/31/2018 2131 12/14/2018 2206   12/15/2018 2145  metroNIDAZOLE (FLAGYL) IVPB 500 mg     500 mg 100 mL/hr over 60 Minutes Intravenous  Once 01/02/2019 2131 12/18/2018 2340   12/27/2018 2145  vancomycin (VANCOCIN) IVPB 1000 mg/200 mL premix     1,000 mg 200 mL/hr over 60 Minutes Intravenous  Once 12/27/2018 2131  12/18/18 0036      Microbiology: Results for orders placed or performed during the hospital encounter of 12/18/2018  Blood Culture (routine x 2)     Status: None   Collection Time: 12/20/2018  9:20 PM   Specimen: BLOOD  Result Value Ref Range Status   Specimen Description BLOOD RIGHT HAND  Final   Special Requests   Final    BOTTLES DRAWN AEROBIC AND ANAEROBIC Blood Culture adequate volume   Culture   Final    NO GROWTH 5 DAYS Performed at [facility], [address], [place], [place] [address]    Report Status 12/22/2018 FINAL  Final  Blood Culture (routine x 2)     Status: None   Collection Time: 12/10/2018  9:29 PM   Specimen: BLOOD  Result Value Ref Range Status   Specimen Description BLOOD RIGHT ANTECUBITAL  Final   Special Requests   Final    BOTTLES DRAWN AEROBIC AND ANAEROBIC Blood Culture adequate volume   Culture   Final    NO GROWTH 5 DAYS Performed at [facility], [address] [place], [place] [address]    Report Status 12/22/2018 FINAL  Final  Respiratory Panel by RT PCR (Flu A&B, Covid) -     Status: None   Collection Time: 12/13/2018 11:41 PM  Result Value Ref Range Status   SARS Coronavirus 2 by RT PCR NEGATIVE NEGATIVE Final    Comment: (NOTE) SARS-CoV-2 target nucleic acids are NOT DETECTED. The SARS-CoV-2 RNA is generally detectable in upper respiratoy specimens during the acute phase of infection. The lowest concentration of SARS-CoV-2 viral copies this assay can detect is 131 copies/mL. A negative result does not preclude SARS-Cov-2 infection and should not be used as the sole basis for treatment or other patient management decisions. A negative result may occur with  improper specimen collection/handling, submission of specimen other than nasopharyngeal swab, presence of viral mutation(s) within the areas targeted by this assay, and inadequate number of viral copies (<131 copies/mL). A negative result must be combined with  clinical observations, patient history, and epidemiological information. The expected result is Negative. Fact Sheet for Patients:  PinkCheek.be Fact Sheet for Healthcare Providers:  GravelBags.it This test is not yet ap proved or cleared by the [place] FDA and  has been authorized for detection and/or diagnosis of SARS-CoV-2 by FDA under an Emergency Use Authorization (EUA). This EUA will remain  in effect (meaning this test can be used) for the duration of the COVID-19 declaration under Section 564(b)(1) of the Act, 21 U.S.C. section 360bbb-3(b)(1), unless the authorization is terminated or revoked sooner.    Influenza A by PCR NEGATIVE NEGATIVE Final   Influenza B by PCR NEGATIVE NEGATIVE Final    Comment: (NOTE) The Xpert Xpress SARS-CoV-2/FLU/RSV assay is intended as an aid in  the diagnosis of influenza from Nasopharyngeal swab specimens and  should not be used as a sole basis for treatment. Nasal washings and  aspirates are unacceptable for Xpert Xpress SARS-CoV-2/FLU/RSV  testing. Fact Sheet for Patients: PinkCheek.be Fact Sheet for Healthcare Providers: GravelBags.it This test is not yet approved or cleared by the [place] FDA and  has been authorized for detection and/or diagnosis of SARS-CoV-2 by  FDA under an Emergency Use Authorization (EUA). This EUA will remain  in effect (meaning this test can be used) for the duration of the  Covid-19 declaration under Section 564(b)(1) of the Act, 21  U.S.C. section 360bbb-3(b)(1), unless the authorization is  terminated or revoked. Performed at [facility], [address] [place], [place] [address]   Urine culture     Status: None   Collection Time: 12/18/18  4:45 AM   Specimen: Urine, Random  Result Value Ref Range Status   Specimen Description   Final    URINE, RANDOM Performed at  [facility], [address], [place], [place] [address]    Special Requests   Final    NONE Performed at [facility], [address], [place], [place] [address]    Culture   Final    NO GROWTH Performed at [facility], [place] [address], [place], [place] [address]    Report Status 12/19/2018 FINAL  Final  CULTURE, BLOOD (ROUTINE X 2) w Reflex to ID Panel     Status: None   Collection Time: 12/18/18  8:40 AM   Specimen: BLOOD  Result Value Ref Range Status   Specimen Description BLOOD BLOOD RIGHT FOREARM  Final   Special Requests   Final    BOTTLES DRAWN AEROBIC AND ANAEROBIC Blood Culture results may not be optimal due to an inadequate volume of blood received in culture bottles   Culture   Final    NO GROWTH 5 DAYS Performed at [facility], [address] [place], [place] [address]    Report Status 12/23/2018 FINAL  Final  CULTURE, BLOOD (ROUTINE X 2) w Reflex to ID Panel     Status: None   Collection Time: 12/18/18  9:02 AM   Specimen: BLOOD  Result Value Ref Range Status   Specimen Description BLOOD BLOOD LEFT ARM  Final   Special Requests   Final    BOTTLES DRAWN AEROBIC AND ANAEROBIC Blood Culture adequate volume   Culture   Final    NO GROWTH 5 DAYS Performed at [facility], [address] [place], [place] [address]    Report Status 12/23/2018 FINAL  Final  MRSA PCR Screening     Status: None   Collection Time: 12/18/18  9:14 AM   Specimen: Nasal Mucosa; Nasopharyngeal  Result Value Ref Range Status   MRSA by PCR NEGATIVE NEGATIVE Final    Comment:        The GeneXpert MRSA Assay (FDA approved for NASAL specimens only), is one component of a comprehensive MRSA colonization surveillance program. It is not intended to diagnose MRSA infection nor to guide or monitor treatment for MRSA infections. Performed at [facility], [address], [place], [place] [address]     Best Practice/Protocols:  VTE  Prophylaxis: Heparin (For CRRT) GI Prophylaxis: Proton Pump Inhibitor Continous Sedation Hyperglycemia (ICU)  Events: 12/14Patient was transferred to ICU for increased WOB and fevers and sepsis HR 170, started on amiodarone 12/15 Intubated, sedated, VASC cath placed, mulitorgan failure, Unable to  place foley catheter by Urology 12/16plan for IR suprapubic catheter placement, remains on vent, severe shock, progressive renal failure 12/18 delirium, this is limiting spontaneous breathing trials, Precedex initiated 12/19 continues to be somewhat encephalopathic/delirious when SAT done however appears more controlled. 12/20 tolerating SBT better, tolerated short period of SBT, secretions thick and purulent  Studies: DG Chest 1 View  Result Date: 12/19/2018 CLINICAL DATA:  Shortness of breath. EXAM: CHEST  1 VIEW COMPARISON:  Radiograph 12/27/2018 FINDINGS: Lower lung volumes from prior exam. Cardiomegaly is not significantly changed allowing for differences in technique. Bronchovascular crowding the similar interstitial prominence to prior. No confluent airspace disease. No large pleural effusion. No pneumothorax. Unchanged osseous structures. IMPRESSION: 1. Lower lung volumes from prior exam. Cardiomegaly is grossly stable. 2. Bronchovascular crowding secondary to lower lung volumes with grossly unchanged interstitial prominence. Electronically Signed   By: Keith Rake M.D.   On: 12/19/2018 05:12   [place] Abscess Drain  Result Date: 12/20/2018 INDICATION: 84 year old with acute respiratory failure, sepsis and acute kidney injury. Unable to place Foley catheter due to urethral stricture. Patient needs a suprapubic catheter. EXAM: PLACEMENT OF SUPRAPUBIC CATHETER WITH ULTRASOUND GUIDANCE MEDICATIONS: Inpatient and receiving IV antibiotics. ANESTHESIA/SEDATION: Patient was already intubated and sedated. CONTRAST:  None FLUOROSCOPY TIME:  None COMPLICATIONS: None immediate. PROCEDURE: The anterior  lower abdomen and pelvis was evaluated with ultrasound. A distended urinary bladder was identified. The skin was shaved. The skin was prepped with chlorhexidine and sterile field was created. Maximal barrier sterile technique was utilized including caps, mask, sterile gowns, sterile gloves, sterile drape, hand hygiene and skin antiseptic. Skin was anesthetized with 1% lidocaine. Using ultrasound guidance, an 18 gauge trocar needle was directed into the urinary bladder. Stiff Amplatz wire was advanced into the bladder. The tract was dilated to accommodate a 12 [place] drain. Approximately 500 mL of urine was drained into a gravity bag. Catheter was sutured to skin and secured to skin with a StatLock. FINDINGS: Distended urinary bladder. 12 French drain placed within the bladder. Bladder was decompressed by the end of the procedure. Greater than 500 mL of fluid was immediately removed from the urinary bladder. IMPRESSION: Successful ultrasound-guided suprapubic catheter placement. Electronically Signed   By: Markus Daft M.D.   On: 12/20/2018 16:24   US PELVIS LIMITED (TRANSABDOMINAL ONLY)  Result Date: 12/20/2018 CLINICAL DATA:  84 year old with acute renal failure and unable to place a Foley catheter. Evaluate bladder for suprapubic catheter placement. EXAM: LIMITED ULTRASOUND OF PELVIS TECHNIQUE: Limited transabdominal ultrasound examination of the pelvis was performed. COMPARISON:  Renal ultrasound 12/19/2018 FINDINGS: Fluid in the urinary bladder. Calculated bladder volume is 435 mL. No unexpected structures located between the bladder and the anterior abdominal wall. IMPRESSION: Fluid in the urinary bladder.  Calculated bladder volume is 435 mL. Electronically Signed   By: Markus Daft M.D.   On: 12/20/2018 13:34   [place] Intraoperative  Result Date: 12/20/2018 CLINICAL DATA:  Ultrasound was provided for use by the ordering physician, and a technical charge was applied by the performing facility.  No  radiologist interpretation/professional services rendered.   US RENAL  Result Date: 12/19/2018 CLINICAL DATA:  Acute renal failure, history bladder calculi, BPH, coronary artery disease, CHF, diabetes mellitus, hypertension, prostate cancer EXAM: RENAL / URINARY TRACT ULTRASOUND COMPLETE COMPARISON:  Abdomen ultrasound 08/22/2017 FINDINGS: Right Kidney: Renal measurements: 11.0 x 5.4 x 5.8 cm = volume: 180 mL. Degradation of image quality secondary to body habitus and anasarca. Grossly normal cortical thickness and echogenicity. No  definite renal mass or hydronephrosis. Left Kidney: Renal measurements: 11.2 x 4.6 x 4.9 cm = volume: 131 mL. Suboptimal visualization due to body habitus and anasarca. No gross evidence of renal mass or hydronephrosis Bladder: Appears normal for degree of bladder distention. Calculated volume 398 mL. Other: N/A IMPRESSION: Suboptimal visualization of the kidneys due to body habitus and anasarca. No definite renal sonographic abnormalities identified. Electronically Signed   By: Lavonia Dana M.D.   On: 12/19/2018 11:18   DG Chest Port 1 View  Result Date: 12/19/2018 CLINICAL DATA:  Encounter for central line placement. EXAM: PORTABLE CHEST 1 VIEW COMPARISON:  Chest x-ray 12/19/2018. FINDINGS: Endotracheal tube, left IJ line, NG tube in stable position. Cardiomegaly. Mild bilateral subsegmental atelectasis and/or infiltrates again noted without interim change. No pleural effusion or pneumothorax. IMPRESSION: 1. Interim placement left IJ line, its tip is over the superior vena cava. Endotracheal tube and NG tube in stable position. 2.  Stable cardiomegaly. 3. Mild bilateral subsegmental atelectasis and or infiltrate again noted without interim change. Electronically Signed   By: Marcello Moores  Register   On: 12/19/2018 09:26   DG Chest Port 1 View  Result Date: 12/19/2018 CLINICAL DATA:  Status post intubation. EXAM: PORTABLE CHEST 1 VIEW COMPARISON:  Same day. FINDINGS: Stable  cardiomediastinal silhouette. Endotracheal and nasogastric tubes are in grossly good position. No pneumothorax is noted. No significant pleural effusion is noted. Left upper lobe linear opacities are noted concerning for atelectasis or possibly infiltrates. Bony thorax is unremarkable. IMPRESSION: Endotracheal and nasogastric tubes are in grossly good position. Left upper lobe linear opacities are noted concerning for atelectasis or possibly infiltrates. Follow-up radiographs are recommended. Electronically Signed   By: Marijo Conception M.D.   On: 12/19/2018 08:23   DG Chest Port 1 View  Result Date: 12/16/2018 CLINICAL DATA:  Initial evaluation for acute fever, cough, congestion. EXAM: PORTABLE CHEST 1 VIEW COMPARISON:  Prior CT from 06/20/2017 FINDINGS: Mild cardiomegaly. Mediastinal silhouette within normal limits. Lungs hypoinflated. Mild diffuse interstitial prominence, which could reflect interstitial congestion and/or atypical pneumonitis. No consolidative opacity identified. No frank alveolar edema. No pleural effusion. No pneumothorax. No acute osseous finding. IMPRESSION: 1. Mild diffuse interstitial prominence, which could reflect interstitial congestion and/or atypical pneumonitis. No consolidative opacity identified. 2. Mild cardiomegaly without frank edema. Electronically Signed   By: Jeannine Boga M.D.   On: 01/01/2019 21:50   DG Abd Portable 1V  Result Date: 12/19/2018 CLINICAL DATA:  NG tube placement EXAM: PORTABLE ABDOMEN - 1 VIEW COMPARISON:  12/19/2018 FINDINGS: NG tube projects over the mid stomach body. Mild gaseous distention of the colon and small bowel in the left abdomen. No definite obstruction pattern. No abnormal calcifications. Degenerative changes of the spine. IMPRESSION: NG tube within the mid stomach body.  Nonspecific bowel gas pattern. Electronically Signed   By: Jerilynn Mages.  Shick M.D.   On: 12/19/2018 08:23   ECHOCARDIOGRAM COMPLETE  Result Date: 12/22/2018    ECHOCARDIOGRAM REPORT   Patient Name:   LAMON ROTUNDO Date of Exam: 12/22/2018 Medical Rec #:  [id number]     Height: Accession #:    [id number]    Weight: Date of Birth:  1934-08-30     BSA: Patient Age:    76 years      BP:           95/48 mmHg Patient Gender: M             HR:  89 bpm. Exam Location:  [facility] Procedure: 2D Echo, Cardiac Doppler and Color Doppler Indications:     Bacteremia 790.7  History:         Patient has no prior history of Echocardiogram examinations.                  Risk Factors:Diabetes. PAF.  Sonographer:     Sherrie Sport RDCS (AE) Referring Phys:  [id number] Farin Buhman Veda Canning Diagnosing Phys: Ida Rogue MD  Sonographer Comments: Technically difficult study due to poor echo windows and echo performed with patient supine and on artificial respirator. Pt was getting dialysis during echo- echo performed from the right side of pt bed. IMPRESSIONS  1. Left ventricular ejection fraction, by visual estimation, is 60 to 65%. The left ventricle has normal function. There is no left ventricular hypertrophy.  2. Left ventricular diastolic parameters are indeterminate.  3. The left ventricle has no regional wall motion abnormalities.  4. Global right ventricle has normal systolic function.The right ventricular size is normal. No increase in right ventricular wall thickness.  5. Left atrial size was mildly dilated.  6. Mildly elevated pulmonary artery systolic pressure.  7. No valve vegetation noted. FINDINGS  Left Ventricle: Left ventricular ejection fraction, by visual estimation, is 60 to 65%. The left ventricle has normal function. The left ventricle has no regional wall motion abnormalities. There is no left ventricular hypertrophy. Left ventricular diastolic parameters are indeterminate. Normal left atrial pressure. Right Ventricle: The right ventricular size is normal. No increase in right ventricular wall thickness. Global RV systolic function is has normal systolic function. The tricuspid  regurgitant velocity is 2.81 m/s, and with an assumed right atrial pressure  of 5 mmHg, the estimated right ventricular systolic pressure is mildly elevated at 36.6 mmHg. Left Atrium: Left atrial size was mildly dilated. Right Atrium: Right atrial size was normal in size Pericardium: There is no evidence of pericardial effusion. Mitral Valve: The mitral valve is normal in structure. No evidence of mitral valve regurgitation. No evidence of mitral valve stenosis by observation. Tricuspid Valve: The tricuspid valve is normal in structure. Tricuspid valve regurgitation is mild. Aortic Valve: The aortic valve is normal in structure. Aortic valve regurgitation is not visualized. The aortic valve is structurally normal, with no evidence of sclerosis or stenosis. Pulmonic Valve: The pulmonic valve was normal in structure. Pulmonic valve regurgitation is not visualized. Pulmonic regurgitation is not visualized. Aorta: The aortic root, ascending aorta and aortic arch are all structurally normal, with no evidence of dilitation or obstruction. Venous: The inferior vena cava is normal in size with greater than 50% respiratory variability, suggesting right atrial pressure of 3 mmHg. IAS/Shunts: No atrial level shunt detected by color flow Doppler. There is no evidence of a patent foramen ovale. No ventricular septal defect is seen or detected. There is no evidence of an atrial septal defect.  TRICUSPID VALVE TR Peak grad:   31.6 mmHg TR Vmax:        281.00 cm/s  Ida Rogue MD Electronically signed by Ida Rogue MD Signature Date/Time: 12/22/2018/12:24:36 PM    Final     Consults: Treatment Team:  Flora Lipps, MD Abbie Sons, MD   Subjective:    Overnight Issues: No overnight issues, agitated when sedation is lightened, however better controlled.  Significant anasarca but improved from yesterday.  Remains with CVR on amiodarone  Objective:  Vital signs for last 24 hours: Temp:  [98.4 F (36.9 C)-99.5  F (37.5 C)] 98.6  F (37 C) (12/20 0700) Pulse Rate:  [70-86] 76 (12/20 0700) Resp:  [10-25] 20 (12/20 0700) BP: (90-130)/(49-69) 105/56 (12/20 0700) SpO2:  [92 %-100 %] 100 % (12/20 0801) FiO2 (%):  [28 %-30 %] 28 % (12/20 0801) Weight:  [138.2 kg-141.3 kg] 141.3 kg (12/20 0700)  Hemodynamic parameters for last 24 hours:    Intake/Output from previous day: 12/19 0701 - 12/20 0700 In: 3656.4 [I.V.:1475.4; NG/GT:1615; IV Piggyback:566] Out: 2694 [Urine:180]  Intake/Output this shift: No intake/output data recorded.  Vent settings for last 24 hours: Vent Mode: PRVC FiO2 (%):  [28 %-30 %] 28 % Set Rate:  [16 bmp] 16 bmp Vt Set:  [550 mL] 550 mL PEEP:  [5 cmH20] 5 cmH20 Plateau Pressure:  [14 cmH20-16 cmH20] 16 cmH20  Physical Exam:  GENERAL: Obese gentleman, sedated, intubated and mechanically ventilated.Following commands with wake up assessment HEAD: Normocephalic, atraumatic.  EYES: Pupils equal, round, reactive to light.  No scleral icterus.  MOUTH: Orotracheally intubated, OG in place NECK: Supple.  Trachea midline PULMONARY: Coarse breath sounds, no wheezes CARDIOVASCULAR: S1 and S2.  IRR, CVR. No murmurs, rubs, or gallops.  GASTROINTESTINAL: Soft, non-distended. Positive bowel sounds.  MUSCULOSKELETAL: Anasarca 1+, no joint deformity NEUROLOGIC: Sedated, more calm when sedation is lightened.  Following commands SKIN:anasarca,1+ (markedly improved), blistering of UE skin due to edema has resolved, thin skin, multiple echymotic areas particularly in upper extremities   Assessment/Plan:    Acute hypoxic respiratory failure, ventilator dependent Pneumonia, community-acquired NO ORGANISM SPECIFIED Volume overload due to acute renal failure Continue ventilator support SAT and SBT as tolerated Continue cefepime Continue bronchodilator therapy for mucociliary clearance Volume overload management with hemofiltration in CRRT Secretions thicker today more purulent,  sputum for C&S  Acute on chronic renal failure with attendant electrolyte imbalance Hyperkalemia Metabolic acidosis Baseline CKD IIIb with creatinine 1.6, EGFR 42 Bladder outlet obstruction, no hydronephrosis Continue CRRT per renal Continue suprapubic catheter drainage Electrolyte management per renal (CRRT) and pharmacy Avoid nephrotoxins Careful dosing of all medications and antibiotics  Severe sepsis Septic shock-resolving Community-acquired pneumonia, NO ORGANISM SPECIFIED Continue cefepime completed azithromycin, cefepime total of 7 days Cultures have been NEGATIVE (no Strep pyogenes bacteremia identified) Recultured sputum due to thick secretions Dependent on low-dose norepinephrine (1.5 to 2 mcg/min) mostly to tolerate CRRT  Toxic metabolic encephalopathy with delirium due to the above Ventilator discomfort Continue fentanyl infusion for ventilator discomfort Initiated Precedex due to agitation, tolerating, agitation appears better  Persistent atrial fibrillation RVR now CVR Continue amiodarone transition to via OG as tolerated On heparin for CRRT  Nutrition Tube feeds as tolerated Albumin supplementation- total of 4 doses has helped fluid mobilization Bowel regimen as indicated  Diabetes mellitus with hyperglycemia due to physiologic stress Close CBG monitoring ICU hyperglycemia protocol  Skin care Significant subcutaneous tissue edema, improving with fluid removal via CRRT Albumin supplementation has helped in reducing third spacing ICU skin care protocol  EOL issues Patient is DNR Continue current management but no resuscitation in the event of cardiac arrest   LOS: 6 days   Additional comments: Care coordination performed with bedside nurse.  Updated wife at bedside.  Critical Care Total Time*: 35 Minutes  C. Derrill Kay, MD [facility] 12/24/2018  *Care during the described time interval was provided by me and/or other providers on the  critical care team.  I have reviewed this patient's available data, including medical history, events of note, physical examination and test results as part of my evaluation.  **This note was  dictated using voice recognition software/Dragon.  Despite best efforts to proofread, errors can occur which can change the meaning.  Any change was purely unintentional.

## 2018-12-24 NOTE — Progress Notes (Signed)
Carlos Obrien  MRN: [id number]  DOB/AGE: 02/03/1934 84 y.o.  Primary Care Physician:Sparks, Leonie Douglas, MD  Admit date: 12/19/2018  Chief Complaint:  Chief Complaint  Patient presents with  . Weakness  . Fall    S-Pt presented on  12/25/2018 with  Chief Complaint  Patient presents with  . Weakness  . Fall  . Patient remains intubated ,unable to offer any complaints  Medications . sodium chloride   Intravenous Once  . aspirin  81 mg Per Tube Daily  . atorvastatin  20 mg Per Tube QPM  . budesonide (PULMICORT) nebulizer solution  0.5 mg Nebulization BID  . chlorhexidine gluconate (MEDLINE KIT)  15 mL Mouth Rinse BID  . Chlorhexidine Gluconate Cloth  6 each Topical Daily  . feeding supplement (PRO-STAT SUGAR FREE 64)  60 mL Per Tube TID  . heparin injection (subcutaneous)  5,000 Units Subcutaneous Q8H  . insulin aspart  0-20 Units Subcutaneous Q4H  . insulin aspart  6 Units Subcutaneous Q4H  . insulin glargine  32 Units Subcutaneous QHS  . ipratropium-albuterol  3 mL Nebulization Q4H  . mouth rinse  15 mL Mouth Rinse 10 times per day  . multivitamin  15 mL Per Tube Daily  . pantoprazole sodium  40 mg Per Tube Daily  . polyethylene glycol  17 g Per Tube Daily  . senna-docusate  2 tablet Per Tube BID         ROS: Unable to get any data as patient is intubated/sedated  Physical Exam: Vital signs in last 24 hours: Temp:  [98.4 F (36.9 C)-99.5 F (37.5 C)] 98.6 F (37 C) (12/20 0700) Pulse Rate:  [70-86] 84 (12/20 1000) Resp:  [15-25] 23 (12/20 1000) BP: (90-130)/(47-76) 117/76 (12/20 1000) SpO2:  [92 %-100 %] 100 % (12/20 1000) FiO2 (%):  [28 %-30 %] 28 % (12/20 0801) Weight:  [138.2 kg-141.3 kg] 141.3 kg (12/20 0700) Weight change: -1.7 kg Last BM Date: (P) 12/25/2018  Intake/Output from previous day: 12/19 0701 - 12/20 0700 In: 3656.4 [I.V.:1475.4; NG/GT:1615; IV Piggyback:566] Out: 7741 [Urine:180] Total I/O In: 636.2 [P.O.:155; I.V.:131.2; NG/GT:150; IV  Piggyback:200] Out: 583 [Urine:10; Other:573]   Physical Exam: General- pt is sedated/intubated\ HEENT ET tube in situ, OG tube in situ Resp-, patient has bilateral breath sounds, patient has rhonchi CVS- S1S2 irregular in rate and rhythm GIT- BS+, soft, NT, ND EXT- 1+UE and  LE Edema,no Cyanosis   Lab Results: CBC Recent Labs    12/23/18 0702 12/24/18 0431  WBC 11.1* 16.7*  HGB 7.8* 8.5*  HCT 24.8* 26.5*  PLT 107* 121*    BMET Recent Labs    12/23/18 1601 12/24/18 0431  NA 137 139  K 5.5* 4.5  CL 106 102  CO2 21* 27  GLUCOSE 250* 217*  BUN 27* 28*  CREATININE 1.08 1.32*  CALCIUM 7.9* 8.1*    Creatinine trend 20202.3==>4.3 now on CRRT 2018 1.81 2013 1.92  MICRO Recent Results (from the past 240 hour(s))  Blood Culture (routine x 2)     Status: None   Collection Time: 12/16/2018  9:20 PM   Specimen: BLOOD  Result Value Ref Range Status   Specimen Description BLOOD RIGHT HAND  Final   Special Requests   Final    BOTTLES DRAWN AEROBIC AND ANAEROBIC Blood Culture adequate volume   Culture   Final    NO GROWTH 5 DAYS Performed at [facility], [address], [place], [place] [address]    Report Status  12/22/2018 FINAL  Final  Blood Culture (routine x 2)     Status: None   Collection Time: 01/03/2019  9:29 PM   Specimen: BLOOD  Result Value Ref Range Status   Specimen Description BLOOD RIGHT ANTECUBITAL  Final   Special Requests   Final    BOTTLES DRAWN AEROBIC AND ANAEROBIC Blood Culture adequate volume   Culture   Final    NO GROWTH 5 DAYS Performed at [facility], [address] [place], [place] [address]    Report Status 12/22/2018 FINAL  Final  Respiratory Panel by RT PCR (Flu A&B, Covid) -     Status: None   Collection Time: 12/08/2018 11:41 PM  Result Value Ref Range Status   SARS Coronavirus 2 by RT PCR NEGATIVE NEGATIVE Final    Comment: (NOTE) SARS-CoV-2 target nucleic acids are NOT DETECTED. The SARS-CoV-2 RNA is  generally detectable in upper respiratoy specimens during the acute phase of infection. The lowest concentration of SARS-CoV-2 viral copies this assay can detect is 131 copies/mL. A negative result does not preclude SARS-Cov-2 infection and should not be used as the sole basis for treatment or other patient management decisions. A negative result may occur with  improper specimen collection/handling, submission of specimen other than nasopharyngeal swab, presence of viral mutation(s) within the areas targeted by this assay, and inadequate number of viral copies (<131 copies/mL). A negative result must be combined with clinical observations, patient history, and epidemiological information. The expected result is Negative. Fact Sheet for Patients:  PinkCheek.be Fact Sheet for Healthcare Providers:  GravelBags.it This test is not yet ap proved or cleared by the [place] FDA and  has been authorized for detection and/or diagnosis of SARS-CoV-2 by FDA under an Emergency Use Authorization (EUA). This EUA will remain  in effect (meaning this test can be used) for the duration of the COVID-19 declaration under Section 564(b)(1) of the Act, 21 U.S.C. section 360bbb-3(b)(1), unless the authorization is terminated or revoked sooner.    Influenza A by PCR NEGATIVE NEGATIVE Final   Influenza B by PCR NEGATIVE NEGATIVE Final    Comment: (NOTE) The Xpert Xpress SARS-CoV-2/FLU/RSV assay is intended as an aid in  the diagnosis of influenza from Nasopharyngeal swab specimens and  should not be used as a sole basis for treatment. Nasal washings and  aspirates are unacceptable for Xpert Xpress SARS-CoV-2/FLU/RSV  testing. Fact Sheet for Patients: PinkCheek.be Fact Sheet for Healthcare Providers: GravelBags.it This test is not yet approved or cleared by the [place] FDA and   has been authorized for detection and/or diagnosis of SARS-CoV-2 by  FDA under an Emergency Use Authorization (EUA). This EUA will remain  in effect (meaning this test can be used) for the duration of the  Covid-19 declaration under Section 564(b)(1) of the Act, 21  U.S.C. section 360bbb-3(b)(1), unless the authorization is  terminated or revoked. Performed at [facility], [address], [place], [place] [address]   Urine culture     Status: None   Collection Time: 12/18/18  4:45 AM   Specimen: Urine, Random  Result Value Ref Range Status   Specimen Description   Final    URINE, RANDOM Performed at [facility], [address], [place], [place] [address]    Special Requests   Final    NONE Performed at [facility], [address] [place], [place] [address]    Culture   Final    NO GROWTH Performed at [facility]  [facility], [place] [address] [place], [place] [address]    Report Status 12/19/2018 FINAL  Final  CULTURE, BLOOD (ROUTINE X 2) w Reflex to ID Panel     Status: None   Collection Time: 12/18/18  8:40 AM   Specimen: BLOOD  Result Value Ref Range Status   Specimen Description BLOOD BLOOD RIGHT FOREARM  Final   Special Requests   Final    BOTTLES DRAWN AEROBIC AND ANAEROBIC Blood Culture results may not be optimal due to an inadequate volume of blood received in culture bottles   Culture   Final    NO GROWTH 5 DAYS Performed at [facility], [address] [place], [place] [address]    Report Status 12/23/2018 FINAL  Final  CULTURE, BLOOD (ROUTINE X 2) w Reflex to ID Panel     Status: None   Collection Time: 12/18/18  9:02 AM   Specimen: BLOOD  Result Value Ref Range Status   Specimen Description BLOOD BLOOD LEFT ARM  Final   Special Requests   Final    BOTTLES DRAWN AEROBIC AND ANAEROBIC Blood Culture adequate volume   Culture   Final    NO GROWTH 5 DAYS Performed at [facility], [address] [place],  [place] [address]    Report Status 12/23/2018 FINAL  Final  MRSA PCR Screening     Status: None   Collection Time: 12/18/18  9:14 AM   Specimen: Nasal Mucosa; Nasopharyngeal  Result Value Ref Range Status   MRSA by PCR NEGATIVE NEGATIVE Final    Comment:        The GeneXpert MRSA Assay (FDA approved for NASAL specimens only), is one component of a comprehensive MRSA colonization surveillance program. It is not intended to diagnose MRSA infection nor to guide or monitor treatment for MRSA infections. Performed at [facility] [address] [place], [place] [address]       Lab Results  Component Value Date   CALCIUM 8.1 (L) 12/24/2018   PHOS 2.5 12/24/2018               Impression:   84 y.o. male with a PMHx ofAnemia of chronic kidney disease, bilateral lower extremity edema, nephrolithiasis, BPH, chronic kidney disease stage IIIb baseline creatinine 1.6 EGFR 42, coronary artery disease, degenerative disc disease, diabetes mellitus type 2, chronic diastolic heart failure, GERD, Barrett's esophagus, hyperlipidemia, hypertension, paroxysmal atrial fibrillation, prostate cancer, urethral stricture, who was admitted to [facility] on 12/13/2020for evaluation of significant shortness of breath.     1)Renal  AKI secondary to ATN Patient had acute kidney injury secondary to septic shock Patient had AKI on CKD Patient has CKD stage IIIb is at baseline  Patient has CKD since 2013. Patient CKD is most likely secondary to obstructive uropathy though there could be contribution from long-term history of hypertension as well as age associated decline as patient is 84 years old  .  Patient was initiated on CRRT on 12/20/2018 Patient remains oliguric We will continue patient on CRRT  2) hypotension Patient is on vasopressors  3)Anemia of chronic disease Hemoglobin is trending down   4) hyperkalemia   now much better after CRRT   5) acute respiratory failure Remains  intubated Patient is being closely followed by the pulmonary/critical care team  6) atrial fibrillation Patient is on amiodarone drip   Plan:   We will continue the current CRRT We will continue to follow the electrolytes    Carren Blakley s  Manaal Mandala 12/24/2018, 10:52 AM

## 2018-12-25 ENCOUNTER — Inpatient Hospital Stay: Payer: Medicare Other

## 2018-12-25 LAB — BLOOD GAS, ARTERIAL
Acid-base deficit: 0.2 mmol/L (ref 0.0–2.0)
Bicarbonate: 22.6 mmol/L (ref 20.0–28.0)
FIO2: 100
MECHVT: 550 mL
O2 Saturation: 99.9 %
PEEP: 5 cmH2O
Patient temperature: 37
RATE: 16 resp/min
pCO2 arterial: 31 mmHg — ABNORMAL LOW (ref 32.0–48.0)
pH, Arterial: 7.47 — ABNORMAL HIGH (ref 7.350–7.450)
pO2, Arterial: 240 mmHg — ABNORMAL HIGH (ref 83.0–108.0)

## 2018-12-25 LAB — RENAL FUNCTION PANEL
Albumin: 2.8 g/dL — ABNORMAL LOW (ref 3.5–5.0)
Albumin: 2.9 g/dL — ABNORMAL LOW (ref 3.5–5.0)
Anion gap: 13 (ref 5–15)
Anion gap: 13 (ref 5–15)
BUN: 29 mg/dL — ABNORMAL HIGH (ref 8–23)
BUN: 25 mg/dL — ABNORMAL HIGH (ref 8–23)
CO2: 21 mmol/L — ABNORMAL LOW (ref 22–32)
CO2: 23 mmol/L (ref 22–32)
Calcium: 8.3 mg/dL — ABNORMAL LOW (ref 8.9–10.3)
Calcium: 8.4 mg/dL — ABNORMAL LOW (ref 8.9–10.3)
Chloride: 104 mmol/L (ref 98–111)
Chloride: 102 mmol/L (ref 98–111)
Creatinine, Ser: 1.29 mg/dL — ABNORMAL HIGH (ref 0.61–1.24)
Creatinine, Ser: 1.21 mg/dL (ref 0.61–1.24)
GFR calc Af Amer: 59 mL/min — ABNORMAL LOW (ref >60)
GFR calc Af Amer: >60 mL/min (ref >60)
GFR calc non Af Amer: 51 mL/min — ABNORMAL LOW (ref >60)
GFR calc non Af Amer: 55 mL/min — ABNORMAL LOW (ref >60)
Glucose, Bld: 190 mg/dL — ABNORMAL HIGH (ref 70–99)
Glucose, Bld: 114 mg/dL — ABNORMAL HIGH (ref 70–99)
Phosphorus: 2.3 mg/dL — ABNORMAL LOW (ref 2.5–4.6)
Phosphorus: 2.3 mg/dL — ABNORMAL LOW (ref 2.5–4.6)
Potassium: 4.7 mmol/L (ref 3.5–5.1)
Potassium: 4.3 mmol/L (ref 3.5–5.1)
Sodium: 138 mmol/L (ref 135–145)
Sodium: 138 mmol/L (ref 135–145)

## 2018-12-25 LAB — GLUCOSE, CAPILLARY
Glucose-Capillary: 104 mg/dL — ABNORMAL HIGH (ref 70–99)
Glucose-Capillary: 115 mg/dL — ABNORMAL HIGH (ref 70–99)
Glucose-Capillary: 133 mg/dL — ABNORMAL HIGH (ref 70–99)
Glucose-Capillary: 195 mg/dL — ABNORMAL HIGH (ref 70–99)
Glucose-Capillary: 72 mg/dL (ref 70–99)
Glucose-Capillary: 76 mg/dL (ref 70–99)

## 2018-12-25 LAB — LACTIC ACID, PLASMA: Lactic Acid, Venous: 5.1 mmol/L (ref 0.5–1.9)

## 2018-12-25 LAB — CBC
HCT: 26.4 % — ABNORMAL LOW (ref 39.0–52.0)
Hemoglobin: 8.2 g/dL — ABNORMAL LOW (ref 13.0–17.0)
MCH: 30 pg (ref 26.0–34.0)
MCHC: 31.1 g/dL (ref 30.0–36.0)
MCV: 96.7 fL (ref 80.0–100.0)
Platelets: 145 10*3/uL — ABNORMAL LOW (ref 150–400)
RBC: 2.73 MIL/uL — ABNORMAL LOW (ref 4.22–5.81)
RDW: 22.1 % — ABNORMAL HIGH (ref 11.5–15.5)
WBC: 16.3 10*3/uL — ABNORMAL HIGH (ref 4.0–10.5)
nRBC: 2.6 % — ABNORMAL HIGH (ref 0.0–0.2)

## 2018-12-25 LAB — PROCALCITONIN: Procalcitonin: 24.73 ng/mL

## 2018-12-25 LAB — MAGNESIUM: Magnesium: 2.9 mg/dL — ABNORMAL HIGH (ref 1.7–2.4)

## 2018-12-25 MED ORDER — VASOPRESSIN 20 UNIT/ML IV SOLN
0.0300 [IU]/min | INTRAVENOUS | Status: DC
  Administered 2018-12-25: 0.03 [IU]/min via INTRAVENOUS
  Filled 2018-12-25: qty 2

## 2018-12-25 MED ORDER — CHLORHEXIDINE GLUCONATE CLOTH 2 % EX PADS
6.0000 | MEDICATED_PAD | Freq: Every day | CUTANEOUS | Status: DC
  Administered 2018-12-26: 6 via TOPICAL

## 2018-12-25 MED ORDER — HYDROCORTISONE NA SUCCINATE PF 100 MG IJ SOLR
50.0000 mg | Freq: Four times a day (QID) | INTRAMUSCULAR | Status: DC
  Administered 2018-12-25 – 2018-12-26 (×3): 50 mg via INTRAVENOUS
  Filled 2018-12-25 (×3): qty 2

## 2018-12-25 MED ORDER — PIPERACILLIN-TAZOBACTAM 3.375 G IVPB
3.3750 g | Freq: Three times a day (TID) | INTRAVENOUS | Status: DC
  Administered 2018-12-25 – 2018-12-26 (×2): 3.375 g via INTRAVENOUS
  Filled 2018-12-25 (×2): qty 50

## 2018-12-25 MED ORDER — PHENYLEPHRINE CONCENTRATED 100MG/250ML (0.4 MG/ML) INFUSION SIMPLE
0.0000 ug/min | INTRAVENOUS | Status: DC
  Administered 2018-12-25: 10 ug/min via INTRAVENOUS
  Filled 2018-12-25: qty 250

## 2018-12-25 MED ORDER — HYDROCORTISONE NICU INJ SYRINGE 50 MG/ML: 50.0000 mg | Freq: Four times a day (QID) | INTRAVENOUS | Status: DC

## 2018-12-25 MED ORDER — SODIUM PHOSPHATES 45 MMOLE/15ML IV SOLN
10.0000 mmol | Freq: Once | INTRAVENOUS | Status: DC
  Filled 2018-12-25: qty 3.33

## 2018-12-25 MED ORDER — LORAZEPAM 2 MG/ML IJ SOLN
4.0000 mg | Freq: Once | INTRAMUSCULAR | Status: AC
  Administered 2018-12-25: 4 mg via INTRAVENOUS

## 2018-12-25 MED ORDER — VECURONIUM BROMIDE 10 MG IV SOLR
10.0000 mg | INTRAVENOUS | Status: DC | PRN
  Administered 2018-12-26: 10 mg via INTRAVENOUS
  Filled 2018-12-25: qty 10

## 2018-12-25 NOTE — Progress Notes (Signed)
Pharmacy Antibiotic Note  Carlos Obrien is a 84 y.o. male admitted on 12/10/2018 with sepsis.  Pharmacy has been consulted for ZOSYN dosing.  Plan: Zosyn 3.375g IV q8h (4 hour infusion).  Height: 6' 3.98" (193 cm) Weight: 298 lb 11.6 oz (135.5 kg) IBW/kg (Calculated) : 86.76  Temp (24hrs), Avg:99.3 F (37.4 C), Min:98.5 F (36.9 C), Max:100.3 F (37.9 C)  Recent Labs  Lab 12/19/18 0843 12/20/18 0420 12/20/18 1555 12/21/18 0429 12/23/18 0702 12/23/18 1601 12/24/18 0431 12/24/18 1625 12/25/18 0431 12/25/18 1607 12/25/18 2023  WBC  --  19.2*  --  10.1 11.1*  --  16.7*  --  16.3*  --   --   CREATININE  --  4.37*  --  2.51* 1.12 1.08 1.32* 1.23 1.29* 1.21  --   LATICACIDVEN 4.3*  --  1.6  --   --   --   --   --   --   --  5.1*    Estimated Creatinine Clearance: 70.8 mL/min (by C-G formula based on SCr of 1.21 mg/dL).    Allergies  Allergen Reactions  . No Known Allergies     Antimicrobials this admission: Cefepime 12/13 >> 12/20 Zithromax 12/14 >> 12/18 Flagyl 12/13 x 1 Vancomycin 12/13 x 1 Zosyn 12/21  Dose adjustments this admission:   Microbiology results:  BCx:   UCx:    Sputum:    MRSA PCR:   Thank you for allowing pharmacy to be a part of this patient's care.  Hart Robinsons A 12/25/2018 11:06 PM

## 2018-12-25 NOTE — Progress Notes (Signed)
[facility]  ROUNDING NOTE   Subjective:   Tmax 100.3 CRRT - tolerating treatment well. UF 2 liters  Norepinephrine gtt  No bowel movement  Objective:  Vital signs in last 24 hours:  Temp:  [98.5 F (36.9 C)-100.9 F (38.3 C)] 99.1 F (37.3 C) (12/21 0800) Pulse Rate:  [72-118] 89 (12/21 0700) Resp:  [16-32] 29 (12/21 1000) BP: (67-146)/(44-86) 88/52 (12/21 1000) SpO2:  [95 %-100 %] 99 % (12/21 1000) FiO2 (%):  [28 %] 28 % (12/21 0846) Weight:  [135.5 kg] 135.5 kg (12/21 0400)  Weight change: -2.7 kg Filed Weights   12/23/18 0256 12/24/18 0200 12/25/18 0400  Weight: (!) 139.9 kg (!) 138.2 kg 135.5 kg    Intake/Output: I/O last 3 completed shifts: In: 5177.2 [P.O.:270; I.V.:2036.4; NG/GT:2219.2; IV Piggyback:651.7] Out: 7418.3 [Urine:195; Other:7223.3]   Intake/Output this shift:  Total I/O In: 390.4 [I.V.:240.4; NG/GT:150] Out: 500 [Other:500]  Physical Exam: General: Critically ill  Head: NGT, ETT  Eyes: Anicteric   Neck: Obese  trachea midline  Lungs:  PRVC FiO2 40%, tachypnic  Heart: Irregularly irregular, tachycarida  Abdomen:  +distended, hypoactive bowel sounds, +Suprapubic catheter  Extremities:  + peripheral edema.  Neurologic: Intubated and sedated  Skin: No lesions  Access: LIJ temp HD catheter    Basic Metabolic Panel: Recent Labs  Lab 12/21/18 0429 12/22/18 0504 12/23/18 0702 12/23/18 1601 12/24/18 0431 12/24/18 1625 12/25/18 0431  NA 134* 137 138 137 139 137 138  K 5.3* 4.6 4.4 5.5* 4.5 4.8 4.7  CL 103 104 104 106 102 101 104  CO2 _0 21* 27 26 21*  GLUCOSE 261* 276* 212* 250* 217* 219* 190*  BUN 43* 34* 25* 27* 28* 29* 29*  CREATININE 2.51* 1.85* 1.12 1.08 1.32* 1.23 1.29*  CALCIUM 7.7* 7.5* 7.8* 7.9* 8.1* 8.3* 8.3*  MG 2.3 2.3 2.4  --  2.6*  --  2.9*  PHOS 3.3 2.2*  2.2* 1.7* 1.9* 2.5 2.4* 2.3*    Liver Function Tests: Recent Labs  Lab 12/23/18 0702 12/23/18 1601 12/24/18 0431 12/24/18 1625  12/25/18 0431  ALBUMIN 2.3* 2.3* 2.9* 3.4* 2.8*   No results for input(s): LIPASE, AMYLASE in the last 168 hours. No results for input(s): AMMONIA in the last 168 hours.  CBC: Recent Labs  Lab 12/20/18 0420 12/21/18 0429 12/23/18 0702 12/24/18 0431 12/25/18 0431  WBC 19.2* 10.1 11.1* 16.7* 16.3*  NEUTROABS  --  6.8 7.1 12.9*  --   HGB 9.7* 8.7* 7.8* 8.5* 8.2*  HCT 31.0* 28.3* 24.8* 26.5* 26.4*  MCV 96.9 98.6 97.3 94.0 96.7  PLT 159 118* 107* 121* 145*    Cardiac Enzymes: No results for input(s): CKTOTAL, CKMB, CKMBINDEX, TROPONINI in the last 168 hours.  BNP: Invalid input(s): POCBNP  CBG: Recent Labs  Lab 12/24/18 1609 12/24/18 1942 12/24/18 2334 12/25/18 0404 12/25/18 0712  GLUCAP 202* 158* 205* 195* 19*    Microbiology: Results for orders placed or performed during the hospital encounter of 12/21/2018  Blood Culture (routine x 2)     Status: None   Collection Time: 12/23/2018  9:20 PM   Specimen: BLOOD  Result Value Ref Range Status   Specimen Description BLOOD RIGHT HAND  Final   Special Requests   Final    BOTTLES DRAWN AEROBIC AND ANAEROBIC Blood Culture adequate volume   Culture   Final    NO GROWTH 5 DAYS Performed at [facility], [address], [place], [place] [address]  Report Status 12/22/2018 FINAL  Final  Blood Culture (routine x 2)     Status: None   Collection Time: 01/02/2019  9:29 PM   Specimen: BLOOD  Result Value Ref Range Status   Specimen Description BLOOD RIGHT ANTECUBITAL  Final   Special Requests   Final    BOTTLES DRAWN AEROBIC AND ANAEROBIC Blood Culture adequate volume   Culture   Final    NO GROWTH 5 DAYS Performed at [facility], [address] [place], [place] [address]    Report Status 12/22/2018 FINAL  Final  Respiratory Panel by RT PCR (Flu A&B, Covid) -     Status: None   Collection Time: 12/28/2018 11:41 PM  Result Value Ref Range Status   SARS Coronavirus 2 by RT PCR NEGATIVE NEGATIVE Final     Comment: (NOTE) SARS-CoV-2 target nucleic acids are NOT DETECTED. The SARS-CoV-2 RNA is generally detectable in upper respiratoy specimens during the acute phase of infection. The lowest concentration of SARS-CoV-2 viral copies this assay can detect is 131 copies/mL. A negative result does not preclude SARS-Cov-2 infection and should not be used as the sole basis for treatment or other patient management decisions. A negative result may occur with  improper specimen collection/handling, submission of specimen other than nasopharyngeal swab, presence of viral mutation(s) within the areas targeted by this assay, and inadequate number of viral copies (<131 copies/mL). A negative result must be combined with clinical observations, patient history, and epidemiological information. The expected result is Negative. Fact Sheet for Patients:  PinkCheek.be Fact Sheet for Healthcare Providers:  GravelBags.it This test is not yet ap proved or cleared by the [place] FDA and  has been authorized for detection and/or diagnosis of SARS-CoV-2 by FDA under an Emergency Use Authorization (EUA). This EUA will remain  in effect (meaning this test can be used) for the duration of the COVID-19 declaration under Section 564(b)(1) of the Act, 21 U.S.C. section 360bbb-3(b)(1), unless the authorization is terminated or revoked sooner.    Influenza A by PCR NEGATIVE NEGATIVE Final   Influenza B by PCR NEGATIVE NEGATIVE Final    Comment: (NOTE) The Xpert Xpress SARS-CoV-2/FLU/RSV assay is intended as an aid in  the diagnosis of influenza from Nasopharyngeal swab specimens and  should not be used as a sole basis for treatment. Nasal washings and  aspirates are unacceptable for Xpert Xpress SARS-CoV-2/FLU/RSV  testing. Fact Sheet for Patients: PinkCheek.be Fact Sheet for Healthcare  Providers: GravelBags.it This test is not yet approved or cleared by the [place] FDA and  has been authorized for detection and/or diagnosis of SARS-CoV-2 by  FDA under an Emergency Use Authorization (EUA). This EUA will remain  in effect (meaning this test can be used) for the duration of the  Covid-19 declaration under Section 564(b)(1) of the Act, 21  U.S.C. section 360bbb-3(b)(1), unless the authorization is  terminated or revoked. Performed at [facility], [address], [place], [place] [address]   Urine culture     Status: None   Collection Time: 12/18/18  4:45 AM   Specimen: Urine, Random  Result Value Ref Range Status   Specimen Description   Final    URINE, RANDOM Performed at [facility], [address] [place], [place] [address]    Special Requests   Final    NONE Performed at [facility], [address], [place], [place] [address]    Culture   Final    NO GROWTH Performed  at [facility], [address], [place], [place] [address]    Report Status 12/19/2018 FINAL  Final  CULTURE, BLOOD (ROUTINE X 2) w Reflex to ID Panel     Status: None   Collection Time: 12/18/18  8:40 AM   Specimen: BLOOD  Result Value Ref Range Status   Specimen Description BLOOD BLOOD RIGHT FOREARM  Final   Special Requests   Final    BOTTLES DRAWN AEROBIC AND ANAEROBIC Blood Culture results may not be optimal due to an inadequate volume of blood received in culture bottles   Culture   Final    NO GROWTH 5 DAYS Performed at [facility], [address] [place], [place] [address]    Report Status 12/23/2018 FINAL  Final  CULTURE, BLOOD (ROUTINE X 2) w Reflex to ID Panel     Status: None   Collection Time: 12/18/18  9:02 AM   Specimen: BLOOD  Result Value Ref Range Status   Specimen Description BLOOD BLOOD LEFT ARM  Final   Special Requests   Final    BOTTLES DRAWN AEROBIC AND ANAEROBIC Blood Culture  adequate volume   Culture   Final    NO GROWTH 5 DAYS Performed at [facility], [address] [place], [place] [address]    Report Status 12/23/2018 FINAL  Final  MRSA PCR Screening     Status: None   Collection Time: 12/18/18  9:14 AM   Specimen: Nasal Mucosa; Nasopharyngeal  Result Value Ref Range Status   MRSA by PCR NEGATIVE NEGATIVE Final    Comment:        The GeneXpert MRSA Assay (FDA approved for NASAL specimens only), is one component of a comprehensive MRSA colonization surveillance program. It is not intended to diagnose MRSA infection nor to guide or monitor treatment for MRSA infections. Performed at [facility], [address] [place], [place] [address]   Culture, respiratory (non-expectorated)     Status: None (Preliminary result)   Collection Time: 12/24/18  8:21 PM   Specimen: Tracheal Aspirate; Respiratory  Result Value Ref Range Status   Specimen Description   Final    TRACHEAL ASPIRATE Performed at [facility], [address] [place], [place] [address]    Special Requests   Final    NONE Performed at [facility], [address] [place], [place] [address]    Gram Stain   Final    ABUNDANT WBC PRESENT, PREDOMINANTLY PMN MODERATE GRAM POSITIVE COCCI IN CLUSTERS RARE YEAST Performed at [facility], [address], [place], [place] [address]    Culture PENDING  Incomplete   Report Status PENDING  Incomplete    Coagulation Studies: No results for input(s): LABPROT, INR in the last 72 hours.  Urinalysis: No results for input(s): COLORURINE, LABSPEC, PHURINE, GLUCOSEU, HGBUR, BILIRUBINUR, KETONESUR, PROTEINUR, UROBILINOGEN, NITRITE, LEUKOCYTESUR in the last 72 hours.  Invalid input(s): APPERANCEUR    Imaging: DG Chest Port 1 View  Result Date: 12/25/2018 CLINICAL DATA:  Respiratory failure. EXAM: PORTABLE CHEST 1 VIEW COMPARISON:  Radiograph 12/19/2018 FINDINGS: Endotracheal tube tip at the  thoracic inlet. Enteric tube tip below the diaphragm not included in the field of view. Left internal jugular dialysis catheter unchanged in the mid upper SVC. Cardiomegaly with equivocal slight improvement. Slight improvement in lung aeration with decreasing patchy bilateral lung opacities. No pneumothorax or pleural effusion. IMPRESSION: 1. Slight improvement in lung aeration with decreasing patchy lung opacities. 2. Cardiomegaly with  equivocal improvement. 3. Stable support apparatus. Electronically Signed   By: Keith Rake M.D.   On: 12/25/2018 05:03     Medications:   .  prismasol BGK 4/2.5 1,500 mL/hr at 12/25/18 0932  .  prismasol BGK 4/2.5 1,000 mL/hr at 12/25/18 0915  . sodium chloride Stopped (12/20/18 1031)  . amiodarone 30 mg/hr (12/25/18 1000)  . dexmedetomidine (PRECEDEX) IV infusion 1 mcg/kg/hr (12/25/18 1000)  . feeding supplement (VITAL HIGH PROTEIN) Stopped (12/25/18 1008)  . fentaNYL infusion INTRAVENOUS 75 mcg/hr (12/25/18 1000)  . norepinephrine (LEVOPHED) Adult infusion 30 mcg/min (12/25/18 1000)  . prismasol BGK 4/2.5 2,500 mL/hr at 12/25/18 0915  . vasopressin (PITRESSIN) infusion - *FOR SHOCK*     . sodium chloride   Intravenous Once  . aspirin  81 mg Per Tube Daily  . atorvastatin  20 mg Per Tube QPM  . budesonide (PULMICORT) nebulizer solution  0.5 mg Nebulization BID  . chlorhexidine gluconate (MEDLINE KIT)  15 mL Mouth Rinse BID  . Chlorhexidine Gluconate Cloth  6 each Topical Daily  . feeding supplement (PRO-STAT SUGAR FREE 64)  60 mL Per Tube TID  . heparin injection (subcutaneous)  5,000 Units Subcutaneous Q8H  . insulin aspart  0-20 Units Subcutaneous Q4H  . insulin aspart  7 Units Subcutaneous Q4H  . insulin glargine  32 Units Subcutaneous QHS  . ipratropium-albuterol  3 mL Nebulization Q4H  . lactulose  30 g Per Tube TID  . mouth rinse  15 mL Mouth Rinse 10 times per day  . multivitamin  15 mL Per Tube Daily  . pantoprazole sodium  40 mg Per  Tube Daily  . polyethylene glycol  17 g Per Tube Daily  . senna-docusate  2 tablet Per Tube BID   sodium chloride, acetaminophen, heparin, LORazepam  Assessment/ Plan:  Carlos Obrien is a 84 y.o. white male with bilateral lower extremity edema, nephrolithiasis, BPH, coronary artery disease, degenerative disc disease, diabetes mellitus type 2, chronic diastolic heart failure, GERD, Barrett's esophagus, hyperlipidemia, hypertension, paroxysmal atrial fibrillation, prostate cancer, urethral stricture, who was admitted to [facility] on 12/13/2020for evaluation of significant shortness of breath. Intubated and sedated.  Placed on CRRT on 12/16  1. Acute renal failure with hyperkalemia: chronic kidney disease stage IIIb baseline creatinine 1.6 EGFR 42 10/24/18 With obstructive uropathy and hematuria. Status post suprapubic cathter placement.  Continue CRRT  2. Hypotension: requiring vasopressors. Blood cultures negative.   3. Acute respiratory failure: requiring intubation and mechanical ventilation  4. Atrial fibrillation: with tachycardia - amiodarone  5. Ileus - NGT to be placed to suction - stop tube feeds - Abdominal X-ray   LOS: 7 Svetlana Bagby 12/21/202010:47 AM

## 2018-12-25 NOTE — Progress Notes (Signed)
Follow up - Critical Care Medicine Note  Patient Details:    Carlos Obrien is an 84 y.o. male 84 year old with a history of persistent atrial fibrillation, diabetes mellitus, congestive heart failure and prostate cancer presented on 12/21/2018 with fever and respiratory failure.  Currently intubated and mechanically ventilated.  He has had acute renal failure requiring CRRT and bladder outlet obstruction requiring suprapubic catheter.  Lines, Airways, Drains: Airway 8 mm (Active)  Secured at (cm) 25 cm 12/22/18 0751  Measured From Lips 12/22/18 0751  Secured Location Right 12/22/18 0751  Secured By [employer] 12/22/18 0751  Tube Holder Repositioned Yes 12/22/18 0751  Cuff Pressure (cm H2O) 28 cm H2O 12/22/18 0751  Site Condition Cool;Dry 12/22/18 0751     Closed System Drain 1 Midline Back Accordion (Hemovac) 10 Fr. (Active)     NG/OG Tube Orogastric Center mouth Xray Measured external length of tube 65 cm (Active)  Cm Marking at Nare/Corner of Mouth (if applicable) 65 cm 75/44/92 0745  Site Assessment Clean;Dry;Intact 12/22/18 0745  Ongoing Placement Verification No change in cm markings or external length of tube from initial placement 12/22/18 0745  Status Infusing tube feed 12/22/18 0745  Intake (mL) 120 mL 12/21/18 1600  Output (mL) 0 mL 12/20/18 1600     Suprapubic Catheter 12 Fr. (Active)  Site Assessment Clean;Intact;Dry 12/22/18 0745  Dressing Status Clean;Intact;Dry 12/22/18 0745  Dressing Type Dry dressing 12/22/18 0745  Collection Container Standard drainage bag 12/22/18 0745  Securement Method Sutured 12/22/18 0745  Indication for Insertion or Continuance of Catheter Bladder outlet obstruction / other urologic reason 12/22/18 0745  Output (mL) 0 mL 12/22/18 0800     CC follow up resp failure HPI Severe resp failure-hypercapnia Remains on CRRT On vasopressors Critically ill Remains on amiodarone     Anti-infectives:  Anti-infectives (From  admission, onward)   Start     Dose/Rate Route Frequency Ordered Stop   12/20/18 2200  ceFEPIme (MAXIPIME) 2 g in sodium chloride 0.9 % 100 mL IVPB     2 g 200 mL/hr over 30 Minutes Intravenous Every 12 hours 12/20/18 1543 12/24/18 0920   12/18/18 1430  azithromycin (ZITHROMAX) tablet 500 mg     500 mg Oral Daily 12/18/18 1419 12/22/18 0914   12/18/18 1115  ceFEPIme (MAXIPIME) 2 g in sodium chloride 0.9 % 100 mL IVPB  Status:  Discontinued     2 g 200 mL/hr over 30 Minutes Intravenous Daily 12/18/18 1103 12/20/18 1543   12/18/18 0830  ceFEPIme (MAXIPIME) 2 g in sodium chloride 0.9 % 100 mL IVPB  Status:  Discontinued    Note to Pharmacy: Dosing per pharmacy   2 g 200 mL/hr over 30 Minutes Intravenous Every 24 hours 12/18/18 0827 12/18/18 0857   12/18/18 0830  vancomycin (VANCOCIN) IVPB 1000 mg/200 mL premix  Status:  Discontinued    Note to Pharmacy: Dosing per phamaryc   1,000 mg 200 mL/hr over 60 Minutes Intravenous Every 12 hours 12/18/18 0828 12/18/18 0857   12/18/18 0115  vancomycin (VANCOCIN) 1,500 mg in sodium chloride 0.9 % 500 mL IVPB     1,500 mg 250 mL/hr over 120 Minutes Intravenous STAT 12/18/18 0047 12/18/18 0416   12/18/18 0030  oseltamivir (TAMIFLU) capsule 75 mg  Status:  Discontinued     75 mg Oral 2 times daily 12/18/18 0024 12/18/18 1011   12/18/18 0030  sulfamethoxazole-trimethoprim (BACTRIM DS) 800-160 MG per tablet 1 tablet  Status:  Discontinued     1  tablet Oral 2 times daily 12/18/18 0027 12/18/18 1227   12/28/2018 2215  ceFEPIme (MAXIPIME) 2 g in sodium chloride 0.9 % 100 mL IVPB     2 g 200 mL/hr over 30 Minutes Intravenous STAT 12/05/2018 2206 12/27/2018 2340   12/07/2018 2145  ceFEPIme (MAXIPIME) 2 g in sodium chloride 0.9 % 100 mL IVPB  Status:  Discontinued     2 g 200 mL/hr over 30 Minutes Intravenous  Once 12/11/2018 2131 12/15/2018 2206   12/16/2018 2145  metroNIDAZOLE (FLAGYL) IVPB 500 mg     500 mg 100 mL/hr over 60 Minutes Intravenous  Once 12/11/2018 2131  12/11/2018 2340   12/06/2018 2145  vancomycin (VANCOCIN) IVPB 1000 mg/200 mL premix     1,000 mg 200 mL/hr over 60 Minutes Intravenous  Once 12/18/2018 2131 12/18/18 0036      Microbiology: Results for orders placed or performed during the hospital encounter of 12/14/2018  Blood Culture (routine x 2)     Status: None   Collection Time: 12/16/2018  9:20 PM   Specimen: BLOOD  Result Value Ref Range Status   Specimen Description BLOOD RIGHT HAND  Final   Special Requests   Final    BOTTLES DRAWN AEROBIC AND ANAEROBIC Blood Culture adequate volume   Culture   Final    NO GROWTH 5 DAYS Performed at [facility], [address] [place], [place] [address]    Report Status 12/22/2018 FINAL  Final  Blood Culture (routine x 2)     Status: None   Collection Time: 12/14/2018  9:29 PM   Specimen: BLOOD  Result Value Ref Range Status   Specimen Description BLOOD RIGHT ANTECUBITAL  Final   Special Requests   Final    BOTTLES DRAWN AEROBIC AND ANAEROBIC Blood Culture adequate volume   Culture   Final    NO GROWTH 5 DAYS Performed at [facility], [address] [place], [place] [address]    Report Status 12/22/2018 FINAL  Final  Respiratory Panel by RT PCR (Flu A&B, Covid) -     Status: None   Collection Time: 12/20/2018 11:41 PM  Result Value Ref Range Status   SARS Coronavirus 2 by RT PCR NEGATIVE NEGATIVE Final    Comment: (NOTE) SARS-CoV-2 target nucleic acids are NOT DETECTED. The SARS-CoV-2 RNA is generally detectable in upper respiratoy specimens during the acute phase of infection. The lowest concentration of SARS-CoV-2 viral copies this assay can detect is 131 copies/mL. A negative result does not preclude SARS-Cov-2 infection and should not be used as the sole basis for treatment or other patient management decisions. A negative result may occur with  improper specimen collection/handling, submission of specimen other than nasopharyngeal swab, presence of viral  mutation(s) within the areas targeted by this assay, and inadequate number of viral copies (<131 copies/mL). A negative result must be combined with clinical observations, patient history, and epidemiological information. The expected result is Negative. Fact Sheet for Patients:  PinkCheek.be Fact Sheet for Healthcare Providers:  GravelBags.it This test is not yet ap proved or cleared by the [place] FDA and  has been authorized for detection and/or diagnosis of SARS-CoV-2 by FDA under an Emergency Use Authorization (EUA). This EUA will remain  in effect (meaning this test can be used) for the duration of the COVID-19 declaration under Section 564(b)(1) of the Act, 21 U.S.C. section 360bbb-3(b)(1), unless the authorization is terminated or revoked sooner.    Influenza A by PCR NEGATIVE NEGATIVE Final  Influenza B by PCR NEGATIVE NEGATIVE Final    Comment: (NOTE) The Xpert Xpress SARS-CoV-2/FLU/RSV assay is intended as an aid in  the diagnosis of influenza from Nasopharyngeal swab specimens and  should not be used as a sole basis for treatment. Nasal washings and  aspirates are unacceptable for Xpert Xpress SARS-CoV-2/FLU/RSV  testing. Fact Sheet for Patients: PinkCheek.be Fact Sheet for Healthcare Providers: GravelBags.it This test is not yet approved or cleared by the [place] FDA and  has been authorized for detection and/or diagnosis of SARS-CoV-2 by  FDA under an Emergency Use Authorization (EUA). This EUA will remain  in effect (meaning this test can be used) for the duration of the  Covid-19 declaration under Section 564(b)(1) of the Act, 21  U.S.C. section 360bbb-3(b)(1), unless the authorization is  terminated or revoked. Performed at [facility], [address] [place], [place] [address]   Urine culture     Status: None   Collection  Time: 12/18/18  4:45 AM   Specimen: Urine, Random  Result Value Ref Range Status   Specimen Description   Final    URINE, RANDOM Performed at [facility], [address] [place], [place] [address]    Special Requests   Final    NONE Performed at [facility], [address], [place], [place] [address]    Culture   Final    NO GROWTH Performed at [facility], [address], [place], [place] [address]    Report Status 12/19/2018 FINAL  Final  CULTURE, BLOOD (ROUTINE X 2) w Reflex to ID Panel     Status: None   Collection Time: 12/18/18  8:40 AM   Specimen: BLOOD  Result Value Ref Range Status   Specimen Description BLOOD BLOOD RIGHT FOREARM  Final   Special Requests   Final    BOTTLES DRAWN AEROBIC AND ANAEROBIC Blood Culture results may not be optimal due to an inadequate volume of blood received in culture bottles   Culture   Final    NO GROWTH 5 DAYS Performed at [facility], [address] [place], [place] [address]    Report Status 12/23/2018 FINAL  Final  CULTURE, BLOOD (ROUTINE X 2) w Reflex to ID Panel     Status: None   Collection Time: 12/18/18  9:02 AM   Specimen: BLOOD  Result Value Ref Range Status   Specimen Description BLOOD BLOOD LEFT ARM  Final   Special Requests   Final    BOTTLES DRAWN AEROBIC AND ANAEROBIC Blood Culture adequate volume   Culture   Final    NO GROWTH 5 DAYS Performed at [facility], [place] [place], [place] [address]    Report Status 12/23/2018 FINAL  Final  MRSA PCR Screening     Status: None   Collection Time: 12/18/18  9:14 AM   Specimen: Nasal Mucosa; Nasopharyngeal  Result Value Ref Range Status   MRSA by PCR NEGATIVE NEGATIVE Final    Comment:        The GeneXpert MRSA Assay (FDA approved for NASAL specimens only), is one component of a comprehensive MRSA colonization surveillance program. It is not intended to diagnose MRSA infection nor to guide or monitor  treatment for MRSA infections. Performed at [facility], [address] [place],  [address]   Culture, respiratory (non-expectorated)     Status: None (Preliminary result)   Collection Time: 12/24/18  8:21 PM   Specimen: Tracheal Aspirate;  Respiratory  Result Value Ref Range Status   Specimen Description   Final    TRACHEAL ASPIRATE Performed at [facility], [address] [place], [place] [address]    Special Requests   Final    NONE Performed at [facility], [address] [place], [place] [address]    Gram Stain   Final    ABUNDANT WBC PRESENT, PREDOMINANTLY PMN MODERATE GRAM POSITIVE COCCI IN CLUSTERS RARE YEAST Performed at [facility], [place] [address], [place], [place] [address]    Culture PENDING  Incomplete   Report Status PENDING  Incomplete    Best Practice/Protocols:  VTE Prophylaxis: Heparin (For CRRT) GI Prophylaxis: Proton Pump Inhibitor Continous Sedation Hyperglycemia (ICU)  Events: 12/14Patient was transferred to ICU for increased WOB and fevers and sepsis HR 170, started on amiodarone 12/15 Intubated, sedated, VASC cath placed, mulitorgan failure, Unable to place foley catheter by Urology 12/16plan for IR suprapubic catheter placement, remains on vent, severe shock, progressive renal failure 12/18 delirium, this is limiting spontaneous breathing trials, Precedex initiated 12/19 continues to be somewhat encephalopathic/delirious when SAT done however appears more controlled. 12/20 tolerating SBT better, tolerated short period of SBT, secretions thick and purulent 12/21 remains on CRRT, vent support, vasopressors  Studies: DG Chest 1 View  Result Date: 12/19/2018 CLINICAL DATA:  Shortness of breath. EXAM: CHEST  1 VIEW COMPARISON:  Radiograph 12/31/2018 FINDINGS: Lower lung volumes from prior exam. Cardiomegaly is not significantly changed allowing for differences in technique. Bronchovascular crowding  the similar interstitial prominence to prior. No confluent airspace disease. No large pleural effusion. No pneumothorax. Unchanged osseous structures. IMPRESSION: 1. Lower lung volumes from prior exam. Cardiomegaly is grossly stable. 2. Bronchovascular crowding secondary to lower lung volumes with grossly unchanged interstitial prominence. Electronically Signed   By: Keith Rake M.D.   On: 12/19/2018 05:12   [place] Abscess Drain  Result Date: 12/20/2018 INDICATION: 84 year old with acute respiratory failure, sepsis and acute kidney injury. Unable to place Foley catheter due to urethral stricture. Patient needs a suprapubic catheter. EXAM: PLACEMENT OF SUPRAPUBIC CATHETER WITH ULTRASOUND GUIDANCE MEDICATIONS: Inpatient and receiving IV antibiotics. ANESTHESIA/SEDATION: Patient was already intubated and sedated. CONTRAST:  None FLUOROSCOPY TIME:  None COMPLICATIONS: None immediate. PROCEDURE: The anterior lower abdomen and pelvis was evaluated with ultrasound. A distended urinary bladder was identified. The skin was shaved. The skin was prepped with chlorhexidine and sterile field was created. Maximal barrier sterile technique was utilized including caps, mask, sterile gowns, sterile gloves, sterile drape, hand hygiene and skin antiseptic. Skin was anesthetized with 1% lidocaine. Using ultrasound guidance, an 18 gauge trocar needle was directed into the urinary bladder. Stiff Amplatz wire was advanced into the bladder. The tract was dilated to accommodate a 12 [place] drain. Approximately 500 mL of urine was drained into a gravity bag. Catheter was sutured to skin and secured to skin with a StatLock. FINDINGS: Distended urinary bladder. 12 French drain placed within the bladder. Bladder was decompressed by the end of the procedure. Greater than 500 mL of fluid was immediately removed from the urinary bladder. IMPRESSION: Successful ultrasound-guided suprapubic catheter placement. Electronically Signed   By: Markus Daft M.D.   On: 12/20/2018 16:24   US PELVIS LIMITED (TRANSABDOMINAL ONLY)  Result Date: 12/20/2018 CLINICAL DATA:  84 year old with acute renal failure and unable to place a Foley catheter. Evaluate bladder for suprapubic catheter placement. EXAM: LIMITED ULTRASOUND OF PELVIS TECHNIQUE: Limited transabdominal ultrasound examination of the pelvis was performed. COMPARISON:  Renal ultrasound 12/19/2018 FINDINGS: Fluid in the urinary bladder. Calculated bladder volume is 435 mL. No unexpected structures located between the bladder and the anterior abdominal wall. IMPRESSION: Fluid in the urinary bladder.  Calculated bladder volume is 435 mL. Electronically Signed   By: Markus Daft M.D.   On: 12/20/2018 13:34   [place] Intraoperative  Result Date: 12/20/2018 CLINICAL DATA:  Ultrasound was provided for use by the ordering physician, and a technical charge was applied by the performing facility.  No radiologist interpretation/professional services rendered.   US RENAL  Result Date: 12/19/2018 CLINICAL DATA:  Acute renal failure, history bladder calculi, BPH, coronary artery disease, CHF, diabetes mellitus, hypertension, prostate cancer EXAM: RENAL / URINARY TRACT ULTRASOUND COMPLETE COMPARISON:  Abdomen ultrasound 08/22/2017 FINDINGS: Right Kidney: Renal measurements: 11.0 x 5.4 x 5.8 cm = volume: 180 mL. Degradation of image quality secondary to body habitus and anasarca. Grossly normal cortical thickness and echogenicity. No definite renal mass or hydronephrosis. Left Kidney: Renal measurements: 11.2 x 4.6 x 4.9 cm = volume: 131 mL. Suboptimal visualization due to body habitus and anasarca. No gross evidence of renal mass or hydronephrosis Bladder: Appears normal for degree of bladder distention. Calculated volume 398 mL. Other: N/A IMPRESSION: Suboptimal visualization of the kidneys due to body habitus and anasarca. No definite renal sonographic abnormalities identified. Electronically Signed   By: Lavonia Dana M.D.   On: 12/19/2018 11:18   DG Chest Port 1 View  Result Date: 12/25/2018 CLINICAL DATA:  Respiratory failure. EXAM: PORTABLE CHEST 1 VIEW COMPARISON:  Radiograph 12/19/2018 FINDINGS: Endotracheal tube tip at the thoracic inlet. Enteric tube tip below the diaphragm not included in the field of view. Left internal jugular dialysis catheter unchanged in the mid upper SVC. Cardiomegaly with equivocal slight improvement. Slight improvement in lung aeration with decreasing patchy bilateral lung opacities. No pneumothorax or pleural effusion. IMPRESSION: 1. Slight improvement in lung aeration with decreasing patchy lung opacities. 2. Cardiomegaly with equivocal improvement. 3. Stable support apparatus. Electronically Signed   By: Keith Rake M.D.   On: 12/25/2018 05:03   DG Chest Port 1 View  Result Date: 12/19/2018 CLINICAL DATA:  Encounter for central line placement. EXAM: PORTABLE CHEST 1 VIEW COMPARISON:  Chest x-ray 12/19/2018. FINDINGS: Endotracheal tube, left IJ line, NG tube in stable position. Cardiomegaly. Mild bilateral subsegmental atelectasis and/or infiltrates again noted without interim change. No pleural effusion or pneumothorax. IMPRESSION: 1. Interim placement left IJ line, its tip is over the superior vena cava. Endotracheal tube and NG tube in stable position. 2.  Stable cardiomegaly. 3. Mild bilateral subsegmental atelectasis and or infiltrate again noted without interim change. Electronically Signed   By: Marcello Moores  Register   On: 12/19/2018 09:26   DG Chest Port 1 View  Result Date: 12/19/2018 CLINICAL DATA:  Status post intubation. EXAM: PORTABLE CHEST 1 VIEW COMPARISON:  Same day. FINDINGS: Stable cardiomediastinal silhouette. Endotracheal and nasogastric tubes are in grossly good position. No pneumothorax is noted. No significant pleural effusion is noted. Left upper lobe linear opacities are noted concerning for atelectasis or possibly infiltrates. Bony thorax is  unremarkable. IMPRESSION: Endotracheal and nasogastric tubes are in grossly good position. Left upper lobe linear opacities are noted concerning for atelectasis or possibly infiltrates. Follow-up radiographs are recommended. Electronically Signed   By: Marijo Conception M.D.   On: 12/19/2018 08:23   DG Chest Port 1 View  Result Date: 12/23/2018 CLINICAL DATA:  Initial evaluation for acute fever, cough, congestion. EXAM: PORTABLE CHEST 1  VIEW COMPARISON:  Prior CT from 06/20/2017 FINDINGS: Mild cardiomegaly. Mediastinal silhouette within normal limits. Lungs hypoinflated. Mild diffuse interstitial prominence, which could reflect interstitial congestion and/or atypical pneumonitis. No consolidative opacity identified. No frank alveolar edema. No pleural effusion. No pneumothorax. No acute osseous finding. IMPRESSION: 1. Mild diffuse interstitial prominence, which could reflect interstitial congestion and/or atypical pneumonitis. No consolidative opacity identified. 2. Mild cardiomegaly without frank edema. Electronically Signed   By: Jeannine Boga M.D.   On: 01/03/2019 21:50   DG Abd Portable 1V  Result Date: 12/19/2018 CLINICAL DATA:  NG tube placement EXAM: PORTABLE ABDOMEN - 1 VIEW COMPARISON:  12/19/2018 FINDINGS: NG tube projects over the mid stomach body. Mild gaseous distention of the colon and small bowel in the left abdomen. No definite obstruction pattern. No abnormal calcifications. Degenerative changes of the spine. IMPRESSION: NG tube within the mid stomach body.  Nonspecific bowel gas pattern. Electronically Signed   By: Jerilynn Mages.  Shick M.D.   On: 12/19/2018 08:23   ECHOCARDIOGRAM COMPLETE  Result Date: 12/22/2018   ECHOCARDIOGRAM REPORT   Patient Name:   LADARRELL CORNWALL Date of Exam: 12/22/2018 Medical Rec #:  [id number]     Height: Accession #:    [id number]    Weight: Date of Birth:  Dec 09, 1934     BSA: Patient Age:    58 years      BP:           95/48 mmHg Patient Gender: M             HR:            89 bpm. Exam Location:  [facility] Procedure: 2D Echo, Cardiac Doppler and Color Doppler Indications:     Bacteremia 790.7  History:         Patient has no prior history of Echocardiogram examinations.                  Risk Factors:Diabetes. PAF.  Sonographer:     Sherrie Sport RDCS (AE) Referring Phys:  [id number] CARMEN Veda Canning Diagnosing Phys: Ida Rogue MD  Sonographer Comments: Technically difficult study due to poor echo windows and echo performed with patient supine and on artificial respirator. Pt was getting dialysis during echo- echo performed from the right side of pt bed. IMPRESSIONS  1. Left ventricular ejection fraction, by visual estimation, is 60 to 65%. The left ventricle has normal function. There is no left ventricular hypertrophy.  2. Left ventricular diastolic parameters are indeterminate.  3. The left ventricle has no regional wall motion abnormalities.  4. Global right ventricle has normal systolic function.The right ventricular size is normal. No increase in right ventricular wall thickness.  5. Left atrial size was mildly dilated.  6. Mildly elevated pulmonary artery systolic pressure.  7. No valve vegetation noted. FINDINGS  Left Ventricle: Left ventricular ejection fraction, by visual estimation, is 60 to 65%. The left ventricle has normal function. The left ventricle has no regional wall motion abnormalities. There is no left ventricular hypertrophy. Left ventricular diastolic parameters are indeterminate. Normal left atrial pressure. Right Ventricle: The right ventricular size is normal. No increase in right ventricular wall thickness. Global RV systolic function is has normal systolic function. The tricuspid regurgitant velocity is 2.81 m/s, and with an assumed right atrial pressure  of 5 mmHg, the estimated right ventricular systolic pressure is mildly elevated at 36.6 mmHg. Left Atrium: Left atrial size was mildly dilated. Right Atrium: Right atrial size was normal in size  Pericardium: There is no evidence of pericardial effusion. Mitral Valve: The mitral valve is normal in structure. No evidence of mitral valve regurgitation. No evidence of mitral valve stenosis by observation. Tricuspid Valve: The tricuspid valve is normal in structure. Tricuspid valve regurgitation is mild. Aortic Valve: The aortic valve is normal in structure. Aortic valve regurgitation is not visualized. The aortic valve is structurally normal, with no evidence of sclerosis or stenosis. Pulmonic Valve: The pulmonic valve was normal in structure. Pulmonic valve regurgitation is not visualized. Pulmonic regurgitation is not visualized. Aorta: The aortic root, ascending aorta and aortic arch are all structurally normal, with no evidence of dilitation or obstruction. Venous: The inferior vena cava is normal in size with greater than 50% respiratory variability, suggesting right atrial pressure of 3 mmHg. IAS/Shunts: No atrial level shunt detected by color flow Doppler. There is no evidence of a patent foramen ovale. No ventricular septal defect is seen or detected. There is no evidence of an atrial septal defect.  TRICUSPID VALVE TR Peak grad:   31.6 mmHg TR Vmax:        281.00 cm/s  Ida Rogue MD Electronically signed by Ida Rogue MD Signature Date/Time: 12/22/2018/12:24:36 PM    Final     Consults: Treatment Team:  Flora Lipps, MD Abbie Sons, MD    Objective:  Vital signs for last 24 hours: Temp:  [98.5 F (36.9 C)-100.9 F (38.3 C)] 99.5 F (37.5 C) (12/21 0600) Pulse Rate:  [72-118] 94 (12/21 0630) Resp:  [16-31] 27 (12/21 0630) BP: (67-146)/(44-86) 103/60 (12/21 0630) SpO2:  [95 %-100 %] 98 % (12/21 0630) FiO2 (%):  [28 %] 28 % (12/21 0419) Weight:  [135.5 kg] 135.5 kg (12/21 0400)  Hemodynamic parameters for last 24 hours:      REVIEW OF SYSTEMS  PATIENT IS UNABLE TO PROVIDE COMPLETE REVIEW OF SYSTEM S DUE TO SEVERE CRITICAL ILLNESS AND  ENCEPHALOPATHY      Intake/Output from previous day: 12/20 0701 - 12/21 0700 In: 3263 [P.O.:270; I.V.:1236.4; YN/WG:9562.1; IV Piggyback:317.4] Out: 4662.3 [Urine:95]  Intake/Output this shift: No intake/output data recorded.  Vent settings for last 24 hours: Vent Mode: PRVC FiO2 (%):  [28 %] 28 % Set Rate:  [16 bmp] 16 bmp Vt Set:  [550 mL] 550 mL PEEP:  [5 cmH20] 5 cmH20 Plateau Pressure:  [12 cmH20-16 cmH20] 12 cmH20  PHYSICAL EXAMINATION:  GENERAL:critically ill appearing, +resp distress HEAD: Normocephalic, atraumatic.  EYES: Pupils equal, round, reactive to light.  No scleral icterus.  MOUTH: Moist mucosal membrane. NECK: Supple. No thyromegaly. No nodules. No JVD.  PULMONARY: +rhonchi, +wheezing CARDIOVASCULAR: S1 and S2. Regular rate and rhythm. No murmurs, rubs, or gallops.  GASTROINTESTINAL: Soft, nontender, -distended. Positive bowel sounds.  MUSCULOSKELETAL: No swelling, clubbing, or edema.  NEUROLOGIC: obtunded SKIN:intact,warm,dry    Assessment/Plan:    Severe ACUTE Hypoxic and Hypercapnic Respiratory Failure Pneumonia, CAP -continue Mechanical Ventilator support -continue Bronchodilator Therapy -Wean Fio2 and PEEP as tolerated -VAP/VENT bundle implementation -will perform SAT/SBT when respiratory parameters are met  ACUTE KIDNEY INJURY/Renal Failure -follow chem 7 -follow UO -continue Foley Catheter-assess need -Avoid nephrotoxic agents Metabolic acidosis Baseline CKD IIIb with creatinine 1.6, EGFR 42 Bladder outlet obstruction, no hydronephrosis CONTINUE CRRT Follow up nephrology recs    Septic shock -use vasopressors to keep MAP>65 as needed -follow ABG and LA -follow up cultures -emperic ABX   NEUROLOGY - intubated and sedated - minimal sedation to achieve a RASS goal: -1 Toxic metabolic encephalopathy with delirium due  to the above Ventilator discomfort   AFIB WITH RVR On amiodarone infusion     GI GI PROPHYLAXIS as  indicated  NUTRITIONAL STATUS DIET-->TF's as tolerated Constipation protocol as indicated  ELECTROLYTES -follow labs as needed -replace as needed -pharmacy consultation and following    DVT/GI PRX ordered TRANSFUSIONS AS NEEDED MONITOR FSBS ASSESS the need for LABS as needed  Patient is DNR    Critical Care Time devoted to patient care services described in this note is 34 minutes.   Overall, patient is critically ill, prognosis is guarded.  Patient with Multiorgan failure and at high risk for cardiac arrest and death.    Corrin Parker, M.D.  [facility]  Medical Director [place] Director [facility] Cardio-Pulmonary Department

## 2018-12-25 NOTE — Progress Notes (Signed)
Ch responded to a pg to be with the pt's spouse who presented to be under significant emotional distress. Upon the ch's observation, the pt is declining. Ch provided a calming presence and helped the pt's spouse to express her frustrations regarding the recent changes in the pt's health. The ch was present while the pt was receiving a breathing trt and the spouse shared that the pt has not had a previous hx of using a breathing trt. Pt spouse was tearful and shared it was overwhelming to think about losing the pt. The spouse did not want to talk about EOL matters in front of the pt. Ch understood but the spouse did share that she wanted to make it through Xmas then make a decision. Ch was concerned that the pt may expired sooner. Pt wife requested to have her best friend at bedside. Ch reminded the pt's spouse of the visitation policy but would share the request w/ the pt's nurse.  Ch thinks that the pt's wife would benefit from a palliative care consult but also thinks that the pt's time is very limited. The spouse has awareness that the pt is not stable to return home and is slowly coming to a place of acceptance of his demise. Pt and spouse have limited support of family and the spouse feels isolated and alone as she goes through this experience as she shared that she leaves early sometimes when visiting because: "it is too much to bear" as the spouse shared.   F/u is recommended and encourages staff to call for spouse support.    12/25/18 1300  Clinical Encounter Type  Visited With Health care provider;Patient and family together  Visit Type Social support;Psychological support;Critical Care  Referral From Nurse  Consult/Referral To Chaplain  Spiritual Encounters  Spiritual Needs Emotional;Grief support  Stress Factors  Patient Stress Factors Health changes;Loss of control;Major life changes;Lack of caregivers;Exhausted  Family Stress Factors Exhausted;Family relationships;Health changes;Loss of  control;Major life changes

## 2018-12-25 NOTE — Consult Note (Signed)
PHARMACY CONSULT NOTE - FOLLOW UP  Pharmacy Consult for Electrolyte Monitoring and Replacement   Recent Labs: Potassium (mmol/L)  Date Value  12/25/2018 4.7  08/27/2011 4.6   Magnesium (mg/dL)  Date Value  12/25/2018 2.9 (H)   Calcium (mg/dL)  Date Value  12/25/2018 8.3 (L)   Calcium, Total (mg/dL)  Date Value  08/27/2011 8.7   Albumin (g/dL)  Date Value  12/25/2018 2.8 (L)   Phosphorus (mg/dL)  Date Value  12/25/2018 2.3 (L)   Sodium (mmol/L)  Date Value  12/25/2018 138  08/27/2011 70    84 year old male patient admitted with pneumonia and hypoxic respiratory failure, and remains intubated in the ICU.  He has a medical history significant forDM with hyperlipidemia, atrial fibrillation, h/o CHF, h/o prostate cancer, h/o CAD.  Pt was started on amiodarone drip for atrial fibrillation with RVR with likely underlying diastolic dysfunction.  CRRT was started in the setting of progressive renal failure.        Electrolytes No replacement warranted.  Originally planned to replace with sodium phosphate 10 mmol IV x 1.  However, phosphate not replaced as patient has limited access.  Phosphate is not compatible with current infusions (amiodarone and levophed).   Pharmacy will continue to monitor phosphorus Electrolytes with am labs.   Glucose Tube feeds stopped.  Holding Novolog 7 units q4 hours. 35 units of SS coverage in last 24 hours. Current regimen:  SSI 0-20, Lantus 32 units QHS.  Constipation Last BM recorded on 12/13  Continue Senna/Docusate 2 tabs BID and Miralax VT Daily. Patient ordered Lactulose 30mg  VT Q6hr x 2 doses on 12/19. Will continue lactulose 30mg  VT TID.   Pharmacy will continue to monitor and adjust per consult.   Gerald Dexter, RPh  12/25/2018 12:24 PM

## 2018-12-26 DIAGNOSIS — Z515 Encounter for palliative care: Secondary | ICD-10-CM

## 2018-12-26 LAB — RENAL FUNCTION PANEL
Albumin: 2.3 g/dL — ABNORMAL LOW (ref 3.5–5.0)
Anion gap: 14 (ref 5–15)
BUN: 22 mg/dL (ref 8–23)
CO2: 19 mmol/L — ABNORMAL LOW (ref 22–32)
Calcium: 7.6 mg/dL — ABNORMAL LOW (ref 8.9–10.3)
Chloride: 102 mmol/L (ref 98–111)
Creatinine, Ser: 1.26 mg/dL — ABNORMAL HIGH (ref 0.61–1.24)
GFR calc Af Amer: >60 mL/min (ref >60)
GFR calc non Af Amer: 53 mL/min — ABNORMAL LOW (ref >60)
Glucose, Bld: 272 mg/dL — ABNORMAL HIGH (ref 70–99)
Phosphorus: 3.3 mg/dL (ref 2.5–4.6)
Potassium: 5.1 mmol/L (ref 3.5–5.1)
Sodium: 135 mmol/L (ref 135–145)

## 2018-12-26 LAB — CBC WITH DIFFERENTIAL/PLATELET
Abs Immature Granulocytes: 2.79 10*3/uL — ABNORMAL HIGH (ref 0.00–0.07)
Basophils Absolute: 0.1 10*3/uL (ref 0.0–0.1)
Basophils Relative: 0 %
Eosinophils Absolute: 0 10*3/uL (ref 0.0–0.5)
Eosinophils Relative: 0 %
HCT: 27.9 % — ABNORMAL LOW (ref 39.0–52.0)
Hemoglobin: 8.8 g/dL — ABNORMAL LOW (ref 13.0–17.0)
Immature Granulocytes: 9 %
Lymphocytes Relative: 4 %
Lymphs Abs: 1.4 10*3/uL (ref 0.7–4.0)
MCH: 30.1 pg (ref 26.0–34.0)
MCHC: 31.5 g/dL (ref 30.0–36.0)
MCV: 95.5 fL (ref 80.0–100.0)
Monocytes Absolute: 3.8 10*3/uL — ABNORMAL HIGH (ref 0.1–1.0)
Monocytes Relative: 12 %
Neutro Abs: 23.7 10*3/uL — ABNORMAL HIGH (ref 1.7–7.7)
Neutrophils Relative %: 75 %
Platelets: 148 10*3/uL — ABNORMAL LOW (ref 150–400)
RBC: 2.92 MIL/uL — ABNORMAL LOW (ref 4.22–5.81)
RDW: 22.4 % — ABNORMAL HIGH (ref 11.5–15.5)
Smear Review: UNDETERMINED
WBC: 31.8 10*3/uL — ABNORMAL HIGH (ref 4.0–10.5)
nRBC: 3.7 % — ABNORMAL HIGH (ref 0.0–0.2)

## 2018-12-26 LAB — GLUCOSE, CAPILLARY
Glucose-Capillary: 46 mg/dL — ABNORMAL LOW (ref 70–99)
Glucose-Capillary: 52 mg/dL — ABNORMAL LOW (ref 70–99)
Glucose-Capillary: 59 mg/dL — ABNORMAL LOW (ref 70–99)
Glucose-Capillary: 239 mg/dL — ABNORMAL HIGH (ref 70–99)
Glucose-Capillary: 271 mg/dL — ABNORMAL HIGH (ref 70–99)
Glucose-Capillary: 151 mg/dL — ABNORMAL HIGH (ref 70–99)

## 2018-12-26 LAB — PROCALCITONIN: Procalcitonin: 33.07 ng/mL

## 2018-12-26 LAB — MAGNESIUM: Magnesium: 2.8 mg/dL — ABNORMAL HIGH (ref 1.7–2.4)

## 2018-12-26 MED ORDER — DEXTROSE 50 % IV SOLN
INTRAVENOUS | Status: AC
  Administered 2018-12-26: 50 mL via INTRAVENOUS
  Filled 2018-12-26: qty 50

## 2018-12-26 MED ORDER — DEXTROSE 10 % IV SOLN: INTRAVENOUS | Status: DC

## 2018-12-26 MED ORDER — INSULIN GLARGINE 100 UNIT/ML ~~LOC~~ SOLN
15.0000 [IU] | Freq: Every day | SUBCUTANEOUS | Status: DC
  Filled 2018-12-26: qty 0.15

## 2018-12-26 MED ORDER — DEXTROSE 50 % IV SOLN
1.0000 | Freq: Once | INTRAVENOUS | Status: AC
  Administered 2018-12-26: 50 mL via INTRAVENOUS
  Filled 2018-12-26: qty 50

## 2018-12-26 MED ORDER — INSULIN ASPART 100 UNIT/ML ~~LOC~~ SOLN
0.0000 [IU] | SUBCUTANEOUS | Status: DC
  Administered 2018-12-26: 2 [IU] via SUBCUTANEOUS
  Filled 2018-12-26: qty 1

## 2018-12-26 MED ORDER — GLYCOPYRROLATE 0.2 MG/ML IJ SOLN: 0.1000 mg | INTRAMUSCULAR | Status: DC | PRN

## 2018-12-26 MED ORDER — DEXTROSE 50 % IV SOLN
1.0000 | Freq: Once | INTRAVENOUS | Status: AC
  Administered 2018-12-26: 04:00:00 50 mL via INTRAVENOUS
  Filled 2018-12-26: qty 50

## 2018-12-26 MED ORDER — MORPHINE 100MG IN NS 100ML (1MG/ML) PREMIX INFUSION
0.0000 mg/h | INTRAVENOUS | Status: DC
  Administered 2018-12-26: 5 mg/h via INTRAVENOUS
  Filled 2018-12-26: qty 100

## 2018-12-26 MED ORDER — DEXTROSE 50 % IV SOLN
1.0000 | Freq: Once | INTRAVENOUS | Status: AC
  Administered 2018-12-26: 05:00:00 50 mL via INTRAVENOUS
  Filled 2018-12-26: qty 50

## 2018-12-26 MED ORDER — DEXTROSE 50 % IV SOLN: 1.0000 | Freq: Once | INTRAVENOUS | Status: AC

## 2018-12-26 MED FILL — Bicarb 32 mEq/L-Dext IV Soln with K-Ca 4-2.5 mEq/L (CRRT): INTRAVENOUS | Qty: 5000 | Status: AC

## 2018-12-27 LAB — CULTURE, RESPIRATORY W GRAM STAIN: Culture: NORMAL

## 2018-12-30 LAB — CULTURE, BLOOD (ROUTINE X 2)

## 2018-12-31 LAB — CULTURE, BLOOD (ROUTINE X 2): Culture: NO GROWTH

## 2019-01-05 NOTE — Death Summary Note (Signed)
DEATH SUMMARY   Patient Details  Name: Carlos Obrien MRN: [id number] DOB: 05-Sep-1934  Admission/Discharge Information   Admit Date:  Dec 18, 2018  Date of Death:   2018-12-27   Time of Death:  03/27/1132  Length of Stay: March 13, 2022  Referring Physician: Idelle Crouch, MD   Reason(s) for Hospitalization  PNEUMONIA SEPTIC SHOCK RESP FAILURE CRRT  Diagnoses  Preliminary cause of death: PNEUMONIA, SEPTIC SHOCK,DM, ATN Secondary Diagnoses (including complications and co-morbidities):  Active Problems:   Fever   AKI (acute kidney injury) ([place])   Weakness   Type 2 diabetes mellitus with hyperlipidemia (HCC)   Sepsis without acute organ dysfunction (HCC)   Multifocal pneumonia   ESRD (end stage renal disease) (HCC)   Goals of care, counseling/discussion   Palliative care by specialist   DNR (do not resuscitate) discussion   End of life care   Brief Hospital Course (including significant findings, care, treatment, and services provided and events leading to death)    Clinical status relayed to family  Updated and notified of patients medical condition-  Progressive multiorgan failure with very low chance of meaningful recovery.  Patient is in dying  Process.  Family understands the situation.  They have consented and agreed to DNR/DNI and would like to proceed with Comfort care measures.   Family are satisfied with Plan of action and management. All questions answered     Carlos Obrien is an 85 y.o. male 85 year old with a history of persistent atrial fibrillation, diabetes mellitus, congestive heart failure and prostate cancer presented on 2018-12-18 with fever and respiratory failure. Currently intubated and mechanically ventilated. He has had acute renal failure requiring CRRT and bladder outlet obstruction requiring suprapubic catheter.  CC  follow up respiratory failure  SUBJECTIVE  Patient remains critically ill  Prognosis is guarded  On 3 pressors  On CRRT   distended abd increased bilious drainage  BP (!) 122/57  Pulse (!) 118  Temp 98.6 F (37 C) (Axillary)  Resp (!) 30  Ht 6' 3.98" (1.93 m)  Wt (!) 140.3 kg  SpO2 99%  BMI 37.67 kg/m  I/O last 3 completed shifts:  In: 3973.2 [I.V.:2652.9; NG/GT:1195.8; IV Piggyback:124.4]  Out: 4827 [Urine:35; Emesis/NG output:200; MBEML:5449]  Total I/O  In: 63.9 [I.V.:51.4; IV Piggyback:12.4]  Out: -  SpO2: 99 %  O2 Flow Rate (L/min): 35 L/min  FiO2 (%): 65 %  SIGNIFICANT EVENTS  12/14 Patient was transferred to ICU for increased WOB and fevers and sepsis  HR 170, started on amiodarone  12/15 Intubated, sedated, VASC cath placed, mulitorgan failure, Unable to place foley catheter by Urology  12/16 plan for IR suprapubic catheter placement, remains on vent, severe shock, progressive renal failure  12/18 delirium, this is limiting spontaneous breathing trials, Precedex initiated  12/19 continues to be somewhat encephalopathic/delirious when SAT done however appears more controlled.  12/20 tolerating SBT better, tolerated short period of SBT, secretions thick and purulent  12/21 remains on CRRT, vent support, vasopressors  12/27/2022 high chance of dying today  REVIEW OF SYSTEMS  PATIENT IS UNABLE TO PROVIDE COMPLETE REVIEW OF SYSTEMS DUE TO SEVERE CRITICAL ILLNESS  PHYSICAL EXAMINATION:  GENERAL:critically ill appearing, +resp distress  HEAD: Normocephalic, atraumatic.  EYES: Pupils equal, round, reactive to light. No scleral icterus.  MOUTH: Moist mucosal membrane.  NECK: Supple.  PULMONARY: +rhonchi, +wheezing  CARDIOVASCULAR: S1 and S2. Regular rate and rhythm. No murmurs, rubs, or gallops.  GASTROINTESTINAL: +distended. Neg bowel sounds.  MUSCULOSKELETAL: No swelling, clubbing, or  edema.  NEUROLOGIC: obtunded, GCS<8  SKIN:intact,warm,dry  MEDICATIONS: I have reviewed all medications and confirmed regimen as documented  CULTURE RESULTS          Recent Results (from the past 240 hour(s))   Blood Culture (routine x 2) Status: None   Collection Time: 12/18/2018 9:20 PM   Specimen: BLOOD  Result Value Ref Range Status   Specimen Description BLOOD RIGHT HAND  Final   Special Requests   Final    BOTTLES DRAWN AEROBIC AND ANAEROBIC Blood Culture adequate volume   Culture   Final    NO GROWTH 5 DAYS  Performed at [facility], [address] [place], [place] [address]    Report Status 12/22/2018 FINAL  Final  Blood Culture (routine x 2) Status: None   Collection Time: 12/14/2018 9:29 PM   Specimen: BLOOD  Result Value Ref Range Status   Specimen Description BLOOD RIGHT ANTECUBITAL  Final   Special Requests   Final    BOTTLES DRAWN AEROBIC AND ANAEROBIC Blood Culture adequate volume   Culture   Final    NO GROWTH 5 DAYS  Performed at [facility], [address] [place], [place] [address]    Report Status 12/22/2018 FINAL  Final  Respiratory Panel by RT PCR (Flu A&B, Covid) - Status: None   Collection Time: 12/14/2018 11:41 PM  Result Value Ref Range Status   SARS Coronavirus 2 by RT PCR NEGATIVE NEGATIVE Final    Comment: (NOTE)  SARS-CoV-2 target nucleic acids are NOT DETECTED.  The SARS-CoV-2 RNA is generally detectable in upper respiratoy  specimens during the acute phase of infection. The lowest  concentration of SARS-CoV-2 viral copies this assay can detect is  131 copies/mL. A negative result does not preclude SARS-Cov-2  infection and should not be used as the sole basis for treatment or  other patient management decisions. A negative result may occur with  improper specimen collection/handling, submission of specimen other  than nasopharyngeal swab, presence of viral mutation(s) within the  areas targeted by this assay, and inadequate number of viral copies  (<131 copies/mL). A negative result must be combined with clinical  observations, patient history, and epidemiological information. The  expected result is Negative.  Fact Sheet for  Patients:  PinkCheek.be  Fact Sheet for Healthcare Providers:  GravelBags.it  This test is not yet ap proved or cleared by the [place] FDA and  has been authorized for detection and/or diagnosis of SARS-CoV-2 by  FDA under an Emergency Use Authorization (EUA). This EUA will remain  in effect (meaning this test can be used) for the duration of the  COVID-19 declaration under Section 564(b)(1) of the Act, 21 U.S.C.  section 360bbb-3(b)(1), unless the authorization is terminated or  revoked sooner.    Influenza A by PCR NEGATIVE NEGATIVE Final   Influenza B by PCR NEGATIVE NEGATIVE Final    Comment: (NOTE)  The Xpert Xpress SARS-CoV-2/FLU/RSV assay is intended as an aid in  the diagnosis of influenza from Nasopharyngeal swab specimens and  should not be used as a sole basis for treatment. Nasal washings and  aspirates are unacceptable for Xpert Xpress SARS-CoV-2/FLU/RSV  testing.  Fact Sheet for Patients:  PinkCheek.be  Fact Sheet for Healthcare Providers:  GravelBags.it  This test is not yet approved or cleared by the [place] FDA and  has been authorized for detection and/or diagnosis of SARS-CoV-2 by  FDA under an Emergency Use Authorization (EUA). This EUA will remain  in effect (meaning this test can be used) for the duration of the  Covid-19 declaration under Section 564(b)(1) of the Act, 21  U.S.C. section 360bbb-3(b)(1), unless the authorization is  terminated or revoked.  Performed at [facility], [address], [place],  [place] [address]   Urine culture Status: None   Collection Time: 12/18/18 4:45 AM   Specimen: Urine, Random  Result Value Ref Range Status   Specimen Description   Final    URINE, RANDOM  Performed at [facility], [address], [place], [place] [address]    Special Requests   Final    NONE  Performed at  [facility], [address], [place], [place] [address]    Culture   Final    NO GROWTH  Performed at [facility], [place] [address], [place], [place] [address]    Report Status 12/19/2018 FINAL  Final  CULTURE, BLOOD (ROUTINE X 2) w Reflex to ID Panel Status: None   Collection Time: 12/18/18 8:40 AM   Specimen: BLOOD  Result Value Ref Range Status   Specimen Description BLOOD BLOOD RIGHT FOREARM  Final   Special Requests   Final    BOTTLES DRAWN AEROBIC AND ANAEROBIC Blood Culture results may not be optimal due to an inadequate volume of blood received in culture bottles   Culture   Final    NO GROWTH 5 DAYS  Performed at [facility], [address] [place], [place] [address]    Report Status 12/23/2018 FINAL  Final  CULTURE, BLOOD (ROUTINE X 2) w Reflex to ID Panel Status: None   Collection Time: 12/18/18 9:02 AM   Specimen: BLOOD  Result Value Ref Range Status   Specimen Description BLOOD BLOOD LEFT ARM  Final   Special Requests   Final    BOTTLES DRAWN AEROBIC AND ANAEROBIC Blood Culture adequate volume   Culture   Final    NO GROWTH 5 DAYS  Performed at [facility], [address] [place], [place] [address]    Report Status 12/23/2018 FINAL  Final  MRSA PCR Screening Status: None   Collection Time: 12/18/18 9:14 AM   Specimen: Nasal Mucosa; Nasopharyngeal  Result Value Ref Range Status   MRSA by PCR NEGATIVE NEGATIVE Final    Comment: The GeneXpert MRSA Assay (FDA  approved for NASAL specimens  only), is one component of a  comprehensive MRSA colonization  surveillance program. It is not  intended to diagnose MRSA  infection nor to guide or  monitor treatment for  MRSA infections.  Performed at [facility], [address] [place], [place] [address]   Culture, respiratory (non-expectorated) Status: None (Preliminary result)   Collection Time: 12/24/18 8:21 PM   Specimen: Tracheal Aspirate; Respiratory  Result Value  Ref Range Status   Specimen Description   Final    TRACHEAL ASPIRATE  Performed at [facility], [address] [place], [place] [address]    Special Requests   Final    NONE  Performed at [facility], [address] [place], [place] [address]    Gram Stain   Final    ABUNDANT WBC PRESENT, PREDOMINANTLY PMN  MODERATE GRAM POSITIVE COCCI IN CLUSTERS  RARE YEAST  Performed at [facility], [address] [address], [place], [place] [address]    Culture PENDING  Incomplete   Report Status PENDING  Incomplete  CULTURE, BLOOD (ROUTINE X 2) w Reflex to ID Panel  Status: None (Preliminary result)   Collection Time: 12/25/18 11:32 PM   Specimen: BLOOD  Result Value Ref Range Status   Specimen Description BLOOD RIGHT ANTECUBITAL  Final   Special Requests   Final    BOTTLES DRAWN AEROBIC AND ANAEROBIC Blood Culture results may not be optimal due to an excessive volume of blood received in culture bottles   Culture   Final    NO GROWTH < 12 HOURS  Performed at [facility], [address] [place], [place] [address]    Report Status PENDING  Incomplete  CULTURE, BLOOD (ROUTINE X 2) w Reflex to ID Panel Status: None (Preliminary result)   Collection Time: 12/25/18 11:40 PM   Specimen: BLOOD  Result Value Ref Range Status   Specimen Description BLOOD BLOOD RIGHT HAND  Final   Special Requests   Final    BOTTLES DRAWN AEROBIC AND ANAEROBIC Blood Culture results may not be optimal due to an inadequate volume of blood received in culture bottles   Culture   Final    NO GROWTH < 12 HOURS  Performed at [facility], [address], [place],  [address]    Report Status PENDING  Incomplete   IMAGING  DG Chest Port 1 View  Result Date: 12/25/2018  CLINICAL DATA: Intubated patient. Fever and respiratory failure. EXAM: PORTABLE CHEST 1 VIEW COMPARISON: Single-view of the chest 12/25/2018 12/19/2018. FINDINGS: Endotracheal tube remains in place in good  position. Left IJ approach central venous catheter tip is in the superior vena cava, unchanged. NG tube courses into the stomach and below the inferior margin the film. Lungs are clear. Heart size is upper normal. No pneumothorax or pleural effusion. IMPRESSION: Support tubes and lines projecting good position. Lungs are appear clear. Electronically Signed By: Inge Rise M.D. On: 12/25/2018 12:03  DG Abd Portable 2V  Result Date: 12/25/2018  CLINICAL DATA: Abdominal distension. NG tube. EXAM: PORTABLE ABDOMEN - 2 VIEW COMPARISON: Single-view of the abdomen 12/19/2018. FINDINGS: NG tube is in good position with both the tip and side-port in the stomach. The colon is distended with a large volume of gas and stool throughout. Pigtail catheter in the pelvis noted. IMPRESSION: NG tube in good position. Distended colon with a large volume of gas and stool throughout compatible with constipation and/or ileus. Electronically Signed By: Inge Rise M.D. On: 12/25/2018 11:51   Indwelling Urinary Catheter continued, requirement due to   Reason to continue Indwelling Urinary Catheter strict Intake/Output monitoring for hemodynamic instability   Central Line/ continued, requirement due to  Reason to continue [place] of central venous pressure or other hemodynamic parameters and poor IV access   Ventilator continued, requirement due to severe respiratory failure   Ventilator Sedation RASS 0 to -2  ASSESSMENT AND PLAN  SYNOPSIS  Severe ACUTE Hypoxic and Hypercapnic Respiratory Failure, pneumonia CAP  -continue Full MV support  -continue Bronchodilator Therapy  -Wean Fio2 and PEEP as tolerated  Unable to wean from vent today  ACUTE KIDNEY INJURY/Renal Failure  -follow chem 7  -follow UO  -continue Foley Catheter-assess need  -Avoid nephrotoxic agents  -Recheck creatinine  On CRRT  Bladder outlet obstruction, no hydronephrosis  NEUROLOGY  - intubated and sedated  - minimal  sedation to achieve a RASS goal: -1  SHOCK-SEPSIS/HYPOVOLUMIC/CARDIOGENIC  -use vasopressors to keep MAP>65  -follow ABG and LA  -follow up cultures  -emperic ABX  -stress dose steroids  CARDIAC ICU monitoring  ID  -continue IV abx as  prescibed  -follow up cultures  GI  GI PROPHYLAXIS as indicated  NUTRITIONAL STATUS  DIET-->NG to suction  Constipation protocol as indicated  Probable ischemic gut, elevated LA  ENDO  - will use ICU hypoglycemic\Hyperglycemia protocol if indicated  ELECTROLYTES  -follow labs as needed  -replace as needed  -pharmacy consultation and following  DVT/GI PRX ordered  TRANSFUSIONS AS NEEDED  MONITOR FSBS  ASSESS the need for LABS as needed    Pertinent Labs and Studies  Significant Diagnostic Studies DG Chest 1 View  Result Date: 12/19/2018 CLINICAL DATA:  Shortness of breath. EXAM: CHEST  1 VIEW COMPARISON:  Radiograph 12/30/2018 FINDINGS: Lower lung volumes from prior exam. Cardiomegaly is not significantly changed allowing for differences in technique. Bronchovascular crowding the similar interstitial prominence to prior. No confluent airspace disease. No large pleural effusion. No pneumothorax. Unchanged osseous structures. IMPRESSION: 1. Lower lung volumes from prior exam. Cardiomegaly is grossly stable. 2. Bronchovascular crowding secondary to lower lung volumes with grossly unchanged interstitial prominence. Electronically Signed   By: Keith Rake M.D.   On: 12/19/2018 05:12   [place] Abscess Drain  Result Date: 12/20/2018 INDICATION: 85 year old with acute respiratory failure, sepsis and acute kidney injury. Unable to place Foley catheter due to urethral stricture. Patient needs a suprapubic catheter. EXAM: PLACEMENT OF SUPRAPUBIC CATHETER WITH ULTRASOUND GUIDANCE MEDICATIONS: Inpatient and receiving IV antibiotics. ANESTHESIA/SEDATION: Patient was already intubated and sedated. CONTRAST:  None FLUOROSCOPY TIME:  None COMPLICATIONS: None  immediate. PROCEDURE: The anterior lower abdomen and pelvis was evaluated with ultrasound. A distended urinary bladder was identified. The skin was shaved. The skin was prepped with chlorhexidine and sterile field was created. Maximal barrier sterile technique was utilized including caps, mask, sterile gowns, sterile gloves, sterile drape, hand hygiene and skin antiseptic. Skin was anesthetized with 1% lidocaine. Using ultrasound guidance, an 18 gauge trocar needle was directed into the urinary bladder. Stiff Amplatz wire was advanced into the bladder. The tract was dilated to accommodate a 12 [place] drain. Approximately 500 mL of urine was drained into a gravity bag. Catheter was sutured to skin and secured to skin with a StatLock. FINDINGS: Distended urinary bladder. 12 French drain placed within the bladder. Bladder was decompressed by the end of the procedure. Greater than 500 mL of fluid was immediately removed from the urinary bladder. IMPRESSION: Successful ultrasound-guided suprapubic catheter placement. Electronically Signed   By: Markus Daft M.D.   On: 12/20/2018 16:24   US PELVIS LIMITED (TRANSABDOMINAL ONLY)  Result Date: 12/20/2018 CLINICAL DATA:  85 year old with acute renal failure and unable to place a Foley catheter. Evaluate bladder for suprapubic catheter placement. EXAM: LIMITED ULTRASOUND OF PELVIS TECHNIQUE: Limited transabdominal ultrasound examination of the pelvis was performed. COMPARISON:  Renal ultrasound 12/19/2018 FINDINGS: Fluid in the urinary bladder. Calculated bladder volume is 435 mL. No unexpected structures located between the bladder and the anterior abdominal wall. IMPRESSION: Fluid in the urinary bladder.  Calculated bladder volume is 435 mL. Electronically Signed   By: Markus Daft M.D.   On: 12/20/2018 13:34   [place] Intraoperative  Result Date: 12/20/2018 CLINICAL DATA:  Ultrasound was provided for use by the ordering physician, and a technical charge was applied by the  performing facility.  No radiologist interpretation/professional services rendered.   US RENAL  Result Date: 12/19/2018 CLINICAL DATA:  Acute renal failure, history bladder calculi, BPH, coronary artery disease, CHF, diabetes mellitus, hypertension, prostate cancer EXAM: RENAL / URINARY TRACT ULTRASOUND COMPLETE COMPARISON:  Abdomen ultrasound 08/22/2017 FINDINGS: Right  Kidney: Renal measurements: 11.0 x 5.4 x 5.8 cm = volume: 180 mL. Degradation of image quality secondary to body habitus and anasarca. Grossly normal cortical thickness and echogenicity. No definite renal mass or hydronephrosis. Left Kidney: Renal measurements: 11.2 x 4.6 x 4.9 cm = volume: 131 mL. Suboptimal visualization due to body habitus and anasarca. No gross evidence of renal mass or hydronephrosis Bladder: Appears normal for degree of bladder distention. Calculated volume 398 mL. Other: N/A IMPRESSION: Suboptimal visualization of the kidneys due to body habitus and anasarca. No definite renal sonographic abnormalities identified. Electronically Signed   By: Lavonia Dana M.D.   On: 12/19/2018 11:18   DG Chest Port 1 View  Result Date: 12/25/2018 CLINICAL DATA:  Intubated patient.  Fever and respiratory failure. EXAM: PORTABLE CHEST 1 VIEW COMPARISON:  Single-view of the chest 12/25/2018 12/19/2018. FINDINGS: Endotracheal tube remains in place in good position. Left IJ approach central venous catheter tip is in the superior vena cava, unchanged. NG tube courses into the stomach and below the inferior margin the film. Lungs are clear. Heart size is upper normal. No pneumothorax or pleural effusion. IMPRESSION: Support tubes and lines projecting good position. Lungs are appear clear. Electronically Signed   By: Inge Rise M.D.   On: 12/25/2018 12:03   DG Chest Port 1 View  Result Date: 12/25/2018 CLINICAL DATA:  Respiratory failure. EXAM: PORTABLE CHEST 1 VIEW COMPARISON:  Radiograph 12/19/2018 FINDINGS: Endotracheal tube tip  at the thoracic inlet. Enteric tube tip below the diaphragm not included in the field of view. Left internal jugular dialysis catheter unchanged in the mid upper SVC. Cardiomegaly with equivocal slight improvement. Slight improvement in lung aeration with decreasing patchy bilateral lung opacities. No pneumothorax or pleural effusion. IMPRESSION: 1. Slight improvement in lung aeration with decreasing patchy lung opacities. 2. Cardiomegaly with equivocal improvement. 3. Stable support apparatus. Electronically Signed   By: Keith Rake M.D.   On: 12/25/2018 05:03   DG Chest Port 1 View  Result Date: 12/19/2018 CLINICAL DATA:  Encounter for central line placement. EXAM: PORTABLE CHEST 1 VIEW COMPARISON:  Chest x-ray 12/19/2018. FINDINGS: Endotracheal tube, left IJ line, NG tube in stable position. Cardiomegaly. Mild bilateral subsegmental atelectasis and/or infiltrates again noted without interim change. No pleural effusion or pneumothorax. IMPRESSION: 1. Interim placement left IJ line, its tip is over the superior vena cava. Endotracheal tube and NG tube in stable position. 2.  Stable cardiomegaly. 3. Mild bilateral subsegmental atelectasis and or infiltrate again noted without interim change. Electronically Signed   By: Marcello Moores  Register   On: 12/19/2018 09:26   DG Chest Port 1 View  Result Date: 12/19/2018 CLINICAL DATA:  Status post intubation. EXAM: PORTABLE CHEST 1 VIEW COMPARISON:  Same day. FINDINGS: Stable cardiomediastinal silhouette. Endotracheal and nasogastric tubes are in grossly good position. No pneumothorax is noted. No significant pleural effusion is noted. Left upper lobe linear opacities are noted concerning for atelectasis or possibly infiltrates. Bony thorax is unremarkable. IMPRESSION: Endotracheal and nasogastric tubes are in grossly good position. Left upper lobe linear opacities are noted concerning for atelectasis or possibly infiltrates. Follow-up radiographs are recommended.  Electronically Signed   By: Marijo Conception M.D.   On: 12/19/2018 08:23   DG Chest Port 1 View  Result Date: 12/08/2018 CLINICAL DATA:  Initial evaluation for acute fever, cough, congestion. EXAM: PORTABLE CHEST 1 VIEW COMPARISON:  Prior CT from 06/20/2017 FINDINGS: Mild cardiomegaly. Mediastinal silhouette within normal limits. Lungs hypoinflated. Mild diffuse interstitial prominence, which  could reflect interstitial congestion and/or atypical pneumonitis. No consolidative opacity identified. No frank alveolar edema. No pleural effusion. No pneumothorax. No acute osseous finding. IMPRESSION: 1. Mild diffuse interstitial prominence, which could reflect interstitial congestion and/or atypical pneumonitis. No consolidative opacity identified. 2. Mild cardiomegaly without frank edema. Electronically Signed   By: Jeannine Boga M.D.   On: 12/29/2018 21:50   DG Abd Portable 1V  Result Date: 12/19/2018 CLINICAL DATA:  NG tube placement EXAM: PORTABLE ABDOMEN - 1 VIEW COMPARISON:  12/19/2018 FINDINGS: NG tube projects over the mid stomach body. Mild gaseous distention of the colon and small bowel in the left abdomen. No definite obstruction pattern. No abnormal calcifications. Degenerative changes of the spine. IMPRESSION: NG tube within the mid stomach body.  Nonspecific bowel gas pattern. Electronically Signed   By: Jerilynn Mages.  Shick M.D.   On: 12/19/2018 08:23   DG Abd Portable 2V  Result Date: 12/25/2018 CLINICAL DATA:  Abdominal distension.  NG tube. EXAM: PORTABLE ABDOMEN - 2 VIEW COMPARISON:  Single-view of the abdomen 12/19/2018. FINDINGS: NG tube is in good position with both the tip and side-port in the stomach. The colon is distended with a large volume of gas and stool throughout. Pigtail catheter in the pelvis noted. IMPRESSION: NG tube in good position. Distended colon with a large volume of gas and stool throughout compatible with constipation and/or ileus. Electronically Signed   By: Inge Rise M.D.   On: 12/25/2018 11:51   ECHOCARDIOGRAM COMPLETE  Result Date: 12/22/2018   ECHOCARDIOGRAM REPORT   Patient Name:   Montgomery GROSSER Date of Exam: 12/22/2018 Medical Rec #:  [id number]     Height: Accession #:    [id number]    Weight: Date of Birth:  [birth year]     BSA: Patient Age:    23 years      BP:           95/48 mmHg Patient Gender: M             HR:           89 bpm. Exam Location:  [facility] Procedure: 2D Echo, Cardiac Doppler and Color Doppler Indications:     Bacteremia 790.7  History:         Patient has no prior history of Echocardiogram examinations.                  Risk Factors:Diabetes. PAF.  Sonographer:     Sherrie Sport RDCS (AE) Referring Phys:  [id number] CARMEN Veda Canning Diagnosing Phys: Ida Rogue MD  Sonographer Comments: Technically difficult study due to poor echo windows and echo performed with patient supine and on artificial respirator. Pt was getting dialysis during echo- echo performed from the right side of pt bed. IMPRESSIONS  1. Left ventricular ejection fraction, by visual estimation, is 60 to 65%. The left ventricle has normal function. There is no left ventricular hypertrophy.  2. Left ventricular diastolic parameters are indeterminate.  3. The left ventricle has no regional wall motion abnormalities.  4. Global right ventricle has normal systolic function.The right ventricular size is normal. No increase in right ventricular wall thickness.  5. Left atrial size was mildly dilated.  6. Mildly elevated pulmonary artery systolic pressure.  7. No valve vegetation noted. FINDINGS  Left Ventricle: Left ventricular ejection fraction, by visual estimation, is 60 to 65%. The left ventricle has normal function. The left ventricle has no regional wall motion abnormalities. There is no left ventricular hypertrophy. Left ventricular  diastolic parameters are indeterminate. Normal left atrial pressure. Right Ventricle: The right ventricular size is normal. No increase in right  ventricular wall thickness. Global RV systolic function is has normal systolic function. The tricuspid regurgitant velocity is 2.81 m/s, and with an assumed right atrial pressure  of 5 mmHg, the estimated right ventricular systolic pressure is mildly elevated at 36.6 mmHg. Left Atrium: Left atrial size was mildly dilated. Right Atrium: Right atrial size was normal in size Pericardium: There is no evidence of pericardial effusion. Mitral Valve: The mitral valve is normal in structure. No evidence of mitral valve regurgitation. No evidence of mitral valve stenosis by observation. Tricuspid Valve: The tricuspid valve is normal in structure. Tricuspid valve regurgitation is mild. Aortic Valve: The aortic valve is normal in structure. Aortic valve regurgitation is not visualized. The aortic valve is structurally normal, with no evidence of sclerosis or stenosis. Pulmonic Valve: The pulmonic valve was normal in structure. Pulmonic valve regurgitation is not visualized. Pulmonic regurgitation is not visualized. Aorta: The aortic root, ascending aorta and aortic arch are all structurally normal, with no evidence of dilitation or obstruction. Venous: The inferior vena cava is normal in size with greater than 50% respiratory variability, suggesting right atrial pressure of 3 mmHg. IAS/Shunts: No atrial level shunt detected by color flow Doppler. There is no evidence of a patent foramen ovale. No ventricular septal defect is seen or detected. There is no evidence of an atrial septal defect.  TRICUSPID VALVE TR Peak grad:   31.6 mmHg TR Vmax:        281.00 cm/s  Ida Rogue MD Electronically signed by Ida Rogue MD Signature Date/Time: 12/22/2018/12:24:36 PM    Final     Microbiology Recent Results (from the past 240 hour(s))  Blood Culture (routine x 2)     Status: None   Collection Time: 12/28/2018  9:20 PM   Specimen: BLOOD  Result Value Ref Range Status   Specimen Description BLOOD RIGHT HAND  Final   Special  Requests   Final    BOTTLES DRAWN AEROBIC AND ANAEROBIC Blood Culture adequate volume   Culture   Final    NO GROWTH 5 DAYS Performed at [facility], [address], [place], [place] [address]    Report Status 12/22/2018 FINAL  Final  Blood Culture (routine x 2)     Status: None   Collection Time: 12/19/2018  9:29 PM   Specimen: BLOOD  Result Value Ref Range Status   Specimen Description BLOOD RIGHT ANTECUBITAL  Final   Special Requests   Final    BOTTLES DRAWN AEROBIC AND ANAEROBIC Blood Culture adequate volume   Culture   Final    NO GROWTH 5 DAYS Performed at [facility], [address] [place], [place] [address]    Report Status 12/22/2018 FINAL  Final  Respiratory Panel by RT PCR (Flu A&B, Covid) -     Status: None   Collection Time: 12/05/2018 11:41 PM  Result Value Ref Range Status   SARS Coronavirus 2 by RT PCR NEGATIVE NEGATIVE Final    Comment: (NOTE) SARS-CoV-2 target nucleic acids are NOT DETECTED. The SARS-CoV-2 RNA is generally detectable in upper respiratoy specimens during the acute phase of infection. The lowest concentration of SARS-CoV-2 viral copies this assay can detect is 131 copies/mL. A negative result does not preclude SARS-Cov-2 infection and should not be used as the sole basis for treatment or other patient management decisions. A negative result may occur with  improper specimen collection/handling, submission of specimen other than nasopharyngeal swab, presence of viral mutation(s) within the areas targeted by this assay, and inadequate number of viral copies (<131 copies/mL). A negative result must be combined with clinical observations, patient history, and epidemiological information. The expected result is Negative. Fact Sheet for Patients:  PinkCheek.be Fact Sheet for Healthcare Providers:  GravelBags.it This test is not yet ap proved or cleared by the [place]  FDA and  has been authorized for detection and/or diagnosis of SARS-CoV-2 by FDA under an Emergency Use Authorization (EUA). This EUA will remain  in effect (meaning this test can be used) for the duration of the COVID-19 declaration under Section 564(b)(1) of the Act, 21 U.S.C. section 360bbb-3(b)(1), unless the authorization is terminated or revoked sooner.    Influenza A by PCR NEGATIVE NEGATIVE Final   Influenza B by PCR NEGATIVE NEGATIVE Final    Comment: (NOTE) The Xpert Xpress SARS-CoV-2/FLU/RSV assay is intended as an aid in  the diagnosis of influenza from Nasopharyngeal swab specimens and  should not be used as a sole basis for treatment. Nasal washings and  aspirates are unacceptable for Xpert Xpress SARS-CoV-2/FLU/RSV  testing. Fact Sheet for Patients: PinkCheek.be Fact Sheet for Healthcare Providers: GravelBags.it This test is not yet approved or cleared by the [place] FDA and  has been authorized for detection and/or diagnosis of SARS-CoV-2 by  FDA under an Emergency Use Authorization (EUA). This EUA will remain  in effect (meaning this test can be used) for the duration of the  Covid-19 declaration under Section 564(b)(1) of the Act, 21  U.S.C. section 360bbb-3(b)(1), unless the authorization is  terminated or revoked. Performed at [facility], [address] [place], [place] [address]   Urine culture     Status: None   Collection Time: 12/18/18  4:45 AM   Specimen: Urine, Random  Result Value Ref Range Status   Specimen Description   Final    URINE, RANDOM Performed at [facility], [address] [place], [place] [address]    Special Requests   Final    NONE Performed at [facility], [address], [place], [place] [address]    Culture   Final    NO GROWTH Performed at [facility], [place] [address], [place], [place] [address]    Report Status 12/19/2018  FINAL  Final  CULTURE, BLOOD (ROUTINE X 2) w Reflex to ID Panel     Status: None   Collection Time: 12/18/18  8:40 AM   Specimen: BLOOD  Result Value Ref Range Status   Specimen Description BLOOD BLOOD RIGHT FOREARM  Final   Special Requests   Final    BOTTLES DRAWN AEROBIC AND ANAEROBIC Blood Culture results may not be optimal due to an inadequate volume of blood received in culture bottles   Culture   Final    NO GROWTH 5 DAYS Performed at [facility], [address] [place],  [address]    Report Status 12/23/2018 FINAL  Final  CULTURE, BLOOD (ROUTINE X 2) w Reflex to ID Panel     Status: None   Collection Time: 12/18/18  9:02 AM   Specimen: BLOOD  Result Value Ref Range Status   Specimen Description BLOOD BLOOD LEFT ARM  Final   Special Requests   Final    BOTTLES DRAWN AEROBIC AND ANAEROBIC Blood Culture adequate volume   Culture   Final    NO GROWTH 5 DAYS Performed  at [facility], [address] [place], [place] [address]    Report Status 12/23/2018 FINAL  Final  MRSA PCR Screening     Status: None   Collection Time: 12/18/18  9:14 AM   Specimen: Nasal Mucosa; Nasopharyngeal  Result Value Ref Range Status   MRSA by PCR NEGATIVE NEGATIVE Final    Comment:        The GeneXpert MRSA Assay (FDA approved for NASAL specimens only), is one component of a comprehensive MRSA colonization surveillance program. It is not intended to diagnose MRSA infection nor to guide or monitor treatment for MRSA infections. Performed at [facility], [address] [place], [place] [address]   Culture, respiratory (non-expectorated)     Status: None (Preliminary result)   Collection Time: 12/24/18  8:21 PM   Specimen: Tracheal Aspirate; Respiratory  Result Value Ref Range Status   Specimen Description   Final    TRACHEAL ASPIRATE Performed at [facility], [address], [place], [place] [address]    Special Requests   Final     NONE Performed at [facility], [address] [place], [place] [address]    Gram Stain   Final    ABUNDANT WBC PRESENT, PREDOMINANTLY PMN MODERATE GRAM POSITIVE COCCI IN CLUSTERS RARE YEAST    Culture   Final    CULTURE REINCUBATED FOR BETTER GROWTH Performed at [facility], [place] [address], [place], [place] [address]    Report Status PENDING  Incomplete  CULTURE, BLOOD (ROUTINE X 2) w Reflex to ID Panel     Status: None (Preliminary result)   Collection Time: 12/25/18 11:32 PM   Specimen: BLOOD  Result Value Ref Range Status   Specimen Description BLOOD RIGHT ANTECUBITAL  Final   Special Requests   Final    BOTTLES DRAWN AEROBIC AND ANAEROBIC Blood Culture results may not be optimal due to an excessive volume of blood received in culture bottles   Culture   Final    NO GROWTH < 12 HOURS Performed at [facility], [address] [place], [place] [address]    Report Status PENDING  Incomplete  CULTURE, BLOOD (ROUTINE X 2) w Reflex to ID Panel     Status: None (Preliminary result)   Collection Time: 12/25/18 11:40 PM   Specimen: BLOOD  Result Value Ref Range Status   Specimen Description BLOOD BLOOD RIGHT HAND  Final   Special Requests   Final    BOTTLES DRAWN AEROBIC AND ANAEROBIC Blood Culture results may not be optimal due to an inadequate volume of blood received in culture bottles   Culture   Final    NO GROWTH < 12 HOURS Performed at [facility], [address] [place], [place] [address]    Report Status PENDING  Incomplete    Lab Basic Metabolic Panel: Recent Labs  Lab 12/22/18 0504 12/23/18 0702 12/24/18 0431 12/24/18 1625 12/25/18 0431 12/25/18 1607 January 21, 2019 0756  NA 137 138 139 137 138 138 135  K 4.6 4.4 4.5 4.8 4.7 4.3 5.1  CL 104 104 102 101 104 102 102  CO2 24 25 27 26  21* 23 19*  GLUCOSE 276* 212* 217* 219* 190* 114* 272*  BUN 34* 25* 28* 29* 29* 25* 22  CREATININE 1.85* 1.12 1.32* 1.23 1.29* 1.21 1.26*   CALCIUM 7.5* 7.8* 8.1* 8.3* 8.3* 8.4* 7.6*  MG 2.3 2.4 2.6*  --  2.9*  --  2.8*  PHOS 2.2*  2.2* 1.7* 2.5 2.4* 2.3* 2.3* 3.3   Liver Function Tests: Recent Labs  Lab 12/24/18 0431 12/24/18 1625 12/25/18 0431 12/25/18 1607 2019-01-13 0756  ALBUMIN 2.9* 3.4* 2.8* 2.9* 2.3*   No results for input(s): LIPASE, AMYLASE in the last 168 hours. No results for input(s): AMMONIA in the last 168 hours. CBC: Recent Labs  Lab 12/21/18 0429 12/23/18 0702 12/24/18 0431 12/25/18 0431 01/13/2019 0756  WBC 10.1 11.1* 16.7* 16.3* 31.8*  NEUTROABS 6.8 7.1 12.9*  --  23.7*  HGB 8.7* 7.8* 8.5* 8.2* 8.8*  HCT 28.3* 24.8* 26.5* 26.4* 27.9*  MCV 98.6 97.3 94.0 96.7 95.5  PLT 118* 107* 121* 145* 148*   Cardiac Enzymes: No results for input(s): CKTOTAL, CKMB, CKMBINDEX, TROPONINI in the last 168 hours. Sepsis Labs: Recent Labs  Lab 12/20/18 0420 12/20/18 1555 12/23/18 0702 12/24/18 0431 12/25/18 0431 12/25/18 2023 13-Jan-2019 0756  PROCALCITON 7.00  --   --   --   --  24.73 33.07  WBC 19.2*  --  11.1* 16.7* 16.3*  --  31.8*  LATICACIDVEN  --  1.6  --   --   --  5.1*  --      Manuella Blackson 01-13-2019, 11:47 AM

## 2019-01-05 NOTE — Progress Notes (Signed)
CRITICAL CARE NOTE Carlos Obrien is an 85 y.o. male 85 year old with a history of persistent atrial fibrillation, diabetes mellitus, congestive heart failure and prostate cancer presented on 12/27/2018 with fever and respiratory failure.  Currently intubated and mechanically ventilated.  He has had acute renal failure requiring CRRT and bladder outlet obstruction requiring suprapubic catheter.    CC  follow up respiratory failure  SUBJECTIVE Patient remains critically ill Prognosis is guarded On 3 pressors On  CRRT distended abd increased bilious drainage    BP (!) 122/57   Pulse (!) 118   Temp 98.6 F (37 C) (Axillary)   Resp (!) 30   Ht 6' 3.98" (1.93 m)   Wt (!) 140.3 kg   SpO2 99%   BMI 37.67 kg/m    I/O last 3 completed shifts: In: 3973.2 [I.V.:2652.9; NG/GT:1195.8; IV Piggyback:124.4] Out: 5638 [Urine:35; Emesis/NG output:200; LHTDS:2876] Total I/O In: 63.9 [I.V.:51.4; IV Piggyback:12.4] Out: -   SpO2: 99 % O2 Flow Rate (L/min): 35 L/min FiO2 (%): 65 %   SIGNIFICANT EVENTS 12/14Patient was transferred to ICU for increased WOB and fevers and sepsis HR 170, started on amiodarone 12/15 Intubated, sedated, VASC cath placed, mulitorgan failure, Unable to place foley catheter by Urology 12/16plan for IR suprapubic catheter placement, remains on vent, severe shock, progressive renal failure 12/18 delirium, this is limiting spontaneous breathing trials, Precedex initiated 12/19 continues to be somewhat encephalopathic/delirious when SAT done however appears more controlled. 12/20 tolerating SBT better, tolerated short period of SBT, secretions thick and purulent 12/21 remains on CRRT, vent support, vasopressors 12/22 high chance of dying today  REVIEW OF SYSTEMS  PATIENT IS UNABLE TO PROVIDE COMPLETE REVIEW OF SYSTEMS DUE TO SEVERE CRITICAL ILLNESS   PHYSICAL EXAMINATION:  GENERAL:critically ill appearing, +resp distress HEAD: Normocephalic, atraumatic.   EYES: Pupils equal, round, reactive to light.  No scleral icterus.  MOUTH: Moist mucosal membrane. NECK: Supple.  PULMONARY: +rhonchi, +wheezing CARDIOVASCULAR: S1 and S2. Regular rate and rhythm. No murmurs, rubs, or gallops.  GASTROINTESTINAL: +distended. Neg  bowel sounds.   MUSCULOSKELETAL: No swelling, clubbing, or edema.  NEUROLOGIC: obtunded, GCS<8 SKIN:intact,warm,dry  MEDICATIONS: I have reviewed all medications and confirmed regimen as documented   CULTURE RESULTS   Recent Results (from the past 240 hour(s))  Blood Culture (routine x 2)     Status: None   Collection Time: 12/19/2018  9:20 PM   Specimen: BLOOD  Result Value Ref Range Status   Specimen Description BLOOD RIGHT HAND  Final   Special Requests   Final    BOTTLES DRAWN AEROBIC AND ANAEROBIC Blood Culture adequate volume   Culture   Final    NO GROWTH 5 DAYS Performed at [facility], [address], [place], [place] [address]    Report Status 12/22/2018 FINAL  Final  Blood Culture (routine x 2)     Status: None   Collection Time: 12/21/2018  9:29 PM   Specimen: BLOOD  Result Value Ref Range Status   Specimen Description BLOOD RIGHT ANTECUBITAL  Final   Special Requests   Final    BOTTLES DRAWN AEROBIC AND ANAEROBIC Blood Culture adequate volume   Culture   Final    NO GROWTH 5 DAYS Performed at [facility], [address] [place], [place] [address]    Report Status 12/22/2018 FINAL  Final  Respiratory Panel by RT PCR (Flu A&B, Covid) -     Status: None   Collection Time: 12/10/2018 11:41 PM  Result Value Ref  Range Status   SARS Coronavirus 2 by RT PCR NEGATIVE NEGATIVE Final    Comment: (NOTE) SARS-CoV-2 target nucleic acids are NOT DETECTED. The SARS-CoV-2 RNA is generally detectable in upper respiratoy specimens during the acute phase of infection. The lowest concentration of SARS-CoV-2 viral copies this assay can detect is 131 copies/mL. A negative result does not preclude  SARS-Cov-2 infection and should not be used as the sole basis for treatment or other patient management decisions. A negative result may occur with  improper specimen collection/handling, submission of specimen other than nasopharyngeal swab, presence of viral mutation(s) within the areas targeted by this assay, and inadequate number of viral copies (<131 copies/mL). A negative result must be combined with clinical observations, patient history, and epidemiological information. The expected result is Negative. Fact Sheet for Patients:  PinkCheek.be Fact Sheet for Healthcare Providers:  GravelBags.it This test is not yet ap proved or cleared by the [place] FDA and  has been authorized for detection and/or diagnosis of SARS-CoV-2 by FDA under an Emergency Use Authorization (EUA). This EUA will remain  in effect (meaning this test can be used) for the duration of the COVID-19 declaration under Section 564(b)(1) of the Act, 21 U.S.C. section 360bbb-3(b)(1), unless the authorization is terminated or revoked sooner.    Influenza A by PCR NEGATIVE NEGATIVE Final   Influenza B by PCR NEGATIVE NEGATIVE Final    Comment: (NOTE) The Xpert Xpress SARS-CoV-2/FLU/RSV assay is intended as an aid in  the diagnosis of influenza from Nasopharyngeal swab specimens and  should not be used as a sole basis for treatment. Nasal washings and  aspirates are unacceptable for Xpert Xpress SARS-CoV-2/FLU/RSV  testing. Fact Sheet for Patients: PinkCheek.be Fact Sheet for Healthcare Providers: GravelBags.it This test is not yet approved or cleared by the [place] FDA and  has been authorized for detection and/or diagnosis of SARS-CoV-2 by  FDA under an Emergency Use Authorization (EUA). This EUA will remain  in effect (meaning this test can be used) for the duration of the  Covid-19  declaration under Section 564(b)(1) of the Act, 21  U.S.C. section 360bbb-3(b)(1), unless the authorization is  terminated or revoked. Performed at [facility], [address], [place], [place] [address]   Urine culture     Status: None   Collection Time: 12/18/18  4:45 AM   Specimen: Urine, Random  Result Value Ref Range Status   Specimen Description   Final    URINE, RANDOM Performed at [facility], [address], [place], [place] [address]    Special Requests   Final    NONE Performed at [facility], [address], [place], [place] [address]    Culture   Final    NO GROWTH Performed at [facility], [place] [address], [place], [place] [address]    Report Status 12/19/2018 FINAL  Final  CULTURE, BLOOD (ROUTINE X 2) w Reflex to ID Panel     Status: None   Collection Time: 12/18/18  8:40 AM   Specimen: BLOOD  Result Value Ref Range Status   Specimen Description BLOOD BLOOD RIGHT FOREARM  Final   Special Requests   Final    BOTTLES DRAWN AEROBIC AND ANAEROBIC Blood Culture results may not be optimal due to an inadequate volume of blood received in culture bottles   Culture   Final    NO GROWTH 5 DAYS Performed at [facility], [address], [place],  [address]  Report Status 12/23/2018 FINAL  Final  CULTURE, BLOOD (ROUTINE X 2) w Reflex to ID Panel     Status: None   Collection Time: 12/18/18  9:02 AM   Specimen: BLOOD  Result Value Ref Range Status   Specimen Description BLOOD BLOOD LEFT ARM  Final   Special Requests   Final    BOTTLES DRAWN AEROBIC AND ANAEROBIC Blood Culture adequate volume   Culture   Final    NO GROWTH 5 DAYS Performed at [facility], [address] [place], [place] [address]    Report Status 12/23/2018 FINAL  Final  MRSA PCR Screening     Status: None   Collection Time: 12/18/18  9:14 AM   Specimen: Nasal Mucosa; Nasopharyngeal  Result Value Ref Range Status   MRSA by PCR  NEGATIVE NEGATIVE Final    Comment:        The GeneXpert MRSA Assay (FDA approved for NASAL specimens only), is one component of a comprehensive MRSA colonization surveillance program. It is not intended to diagnose MRSA infection nor to guide or monitor treatment for MRSA infections. Performed at [facility], [address] [place], [place] [address]   Culture, respiratory (non-expectorated)     Status: None (Preliminary result)   Collection Time: 12/24/18  8:21 PM   Specimen: Tracheal Aspirate; Respiratory  Result Value Ref Range Status   Specimen Description   Final    TRACHEAL ASPIRATE Performed at [facility], [address] [place], [place] [address]    Special Requests   Final    NONE Performed at [facility], [address] [place], [place] [address]    Gram Stain   Final    ABUNDANT WBC PRESENT, PREDOMINANTLY PMN MODERATE GRAM POSITIVE COCCI IN CLUSTERS RARE YEAST Performed at [facility], [place] [address], [place], [place] [address]    Culture PENDING  Incomplete   Report Status PENDING  Incomplete  CULTURE, BLOOD (ROUTINE X 2) w Reflex to ID Panel     Status: None (Preliminary result)   Collection Time: 12/25/18 11:32 PM   Specimen: BLOOD  Result Value Ref Range Status   Specimen Description BLOOD RIGHT ANTECUBITAL  Final   Special Requests   Final    BOTTLES DRAWN AEROBIC AND ANAEROBIC Blood Culture results may not be optimal due to an excessive volume of blood received in culture bottles   Culture   Final    NO GROWTH < 12 HOURS Performed at [facility], [address] [place], [place] [address]    Report Status PENDING  Incomplete  CULTURE, BLOOD (ROUTINE X 2) w Reflex to ID Panel     Status: None (Preliminary result)   Collection Time: 12/25/18 11:40 PM   Specimen: BLOOD  Result Value Ref Range Status   Specimen Description BLOOD BLOOD RIGHT HAND  Final   Special Requests   Final    BOTTLES DRAWN  AEROBIC AND ANAEROBIC Blood Culture results may not be optimal due to an inadequate volume of blood received in culture bottles   Culture   Final    NO GROWTH < 12 HOURS Performed at [facility], [address] [place],  [address]    Report Status PENDING  Incomplete          IMAGING    DG Chest Port 1 View  Result Date: 12/25/2018 CLINICAL DATA:  Intubated patient.  Fever and respiratory failure. EXAM: PORTABLE CHEST 1 VIEW COMPARISON:  Single-view of the chest 12/25/2018 12/19/2018. FINDINGS: Endotracheal tube remains in place in good position. Left IJ approach central venous catheter tip is in the superior vena cava, unchanged. NG tube courses into the stomach and below the inferior margin the film. Lungs are clear. Heart size is upper normal. No pneumothorax or pleural effusion. IMPRESSION: Support tubes and lines projecting good position. Lungs are appear clear. Electronically Signed   By: Inge Rise M.D.   On: 12/25/2018 12:03   DG Abd Portable 2V  Result Date: 12/25/2018 CLINICAL DATA:  Abdominal distension.  NG tube. EXAM: PORTABLE ABDOMEN - 2 VIEW COMPARISON:  Single-view of the abdomen 12/19/2018. FINDINGS: NG tube is in good position with both the tip and side-port in the stomach. The colon is distended with a large volume of gas and stool throughout. Pigtail catheter in the pelvis noted. IMPRESSION: NG tube in good position. Distended colon with a large volume of gas and stool throughout compatible with constipation and/or ileus. Electronically Signed   By: Inge Rise M.D.   On: 12/25/2018 11:51       Indwelling Urinary Catheter continued, requirement due to   Reason to continue Indwelling Urinary Catheter strict Intake/Output monitoring for hemodynamic instability   Central Line/ continued, requirement due to  Reason to continue [place] of central venous pressure or other hemodynamic parameters and poor IV access    Ventilator continued, requirement due to severe respiratory failure   Ventilator Sedation RASS 0 to -2      ASSESSMENT AND PLAN SYNOPSIS   Severe ACUTE Hypoxic and Hypercapnic Respiratory Failure, pneumonia CAP -continue Full MV support -continue Bronchodilator Therapy -Wean Fio2 and PEEP as tolerated Unable to wean from vent today  ACUTE KIDNEY INJURY/Renal Failure -follow chem 7 -follow UO -continue Foley Catheter-assess need -Avoid nephrotoxic agents -Recheck creatinine  On CRRT Bladder outlet obstruction, no hydronephrosis   NEUROLOGY - intubated and sedated - minimal sedation to achieve a RASS goal: -1   SHOCK-SEPSIS/HYPOVOLUMIC/CARDIOGENIC -use vasopressors to keep MAP>65 -follow ABG and LA -follow up cultures -emperic ABX -stress dose steroids  CARDIAC ICU monitoring  ID -continue IV abx as prescibed -follow up cultures  GI GI PROPHYLAXIS as indicated  NUTRITIONAL STATUS DIET-->NG to suction Constipation protocol as indicated Probable ischemic gut, elevated LA  ENDO - will use ICU hypoglycemic\Hyperglycemia protocol if indicated   ELECTROLYTES -follow labs as needed -replace as needed -pharmacy consultation and following   DVT/GI PRX ordered TRANSFUSIONS AS NEEDED MONITOR FSBS ASSESS the need for LABS as needed   Critical Care Time devoted to patient care services described in this note is 34 minutes.   Overall, patient is critically ill, prognosis is guarded.  Patient with Multiorgan failure and at high risk for cardiac arrest and death.    Corrin Parker, M.D.  [facility]  Medical Director [place] Director [facility] Cardio-Pulmonary Department

## 2019-01-05 NOTE — Progress Notes (Signed)
Time of death 11:34, family at bedside. Dr Mortimer Fries aware.

## 2019-01-05 NOTE — Progress Notes (Signed)
One way extubation complete. Pt placed on RA

## 2019-01-05 NOTE — Progress Notes (Signed)
Nutrition Brief Follow-Up Note  Chart reviewed and discussed on rounds. Patient now transitioning to comfort care.   No further nutrition interventions warranted at this time. Please re-consult RD as needed.   Jacklynn Barnacle, MS, RD, LDN Office: [phone] Pager: [phone] After Hours/Weekend Pager: [phone]

## 2019-01-05 NOTE — Progress Notes (Signed)
Stopping CRRT at this time.

## 2019-01-05 NOTE — Progress Notes (Signed)
[facility]  ROUNDING NOTE   Subjective:   Withdrawal of care for this morning.   Objective:  Vital signs in last 24 hours:  Temp:  [98.4 F (36.9 C)-100.1 F (37.8 C)] 98.4 F (36.9 C) (12/22 0800) Pulse Rate:  [73-118] 115 (12/22 0800) Resp:  [16-30] 27 (12/22 1000) BP: (75-190)/(24-93) 75/52 (12/22 1000) SpO2:  [91 %-100 %] 99 % (12/22 0800) FiO2 (%):  [28 %-100 %] 65 % (12/22 0744) Weight:  [140.3 kg] 140.3 kg (12/22 0500)  Weight change: 4.8 kg Filed Weights   12/24/18 0200 12/25/18 0400 12-30-2018 0500  Weight: (!) 138.2 kg 135.5 kg (!) 140.3 kg    Intake/Output: I/O last 3 completed shifts: In: 3973.2 [I.V.:2652.9; NG/GT:1195.8; IV Piggyback:124.4] Out: 3748 [Urine:35; Emesis/NG output:200; OLMBE:6754]   Intake/Output this shift:  Total I/O In: 202.3 [I.V.:178.1; IV Piggyback:24.2] Out: 217 [Other:217]  Physical Exam: General: Critically ill  Head: NGT, ETT  Eyes: Anicteric   Neck: Obese  trachea midline  Lungs:  PRVC FiO2 40%, tachypnic  Heart: Irregularly irregular, tachycarida  Abdomen:  +distended, hypoactive bowel sounds, +Suprapubic catheter  Extremities:  + peripheral edema.  Neurologic: Intubated and sedated  Skin: No lesions  Access: LIJ temp HD catheter    Basic Metabolic Panel: Recent Labs  Lab 12/22/18 0504 12/23/18 0702 12/24/18 0431 12/24/18 1625 12/25/18 0431 12/25/18 1607 Dec 30, 2018 0756  NA 137 138 139 137 138 138 135  K 4.6 4.4 4.5 4.8 4.7 4.3 5.1  CL 104 104 102 101 104 102 102  CO2 24 25 27 26  21* 23 19*  GLUCOSE 276* 212* 217* 219* 190* 114* 272*  BUN 34* 25* 28* 29* 29* 25* 22  CREATININE 1.85* 1.12 1.32* 1.23 1.29* 1.21 1.26*  CALCIUM 7.5* 7.8* 8.1* 8.3* 8.3* 8.4* 7.6*  MG 2.3 2.4 2.6*  --  2.9*  --  2.8*  PHOS 2.2*  2.2* 1.7* 2.5 2.4* 2.3* 2.3* 3.3    Liver Function Tests: Recent Labs  Lab 12/24/18 0431 12/24/18 1625 12/25/18 0431 12/25/18 1607 12-30-2018 0756  ALBUMIN 2.9* 3.4* 2.8* 2.9* 2.3*    No results for input(s): LIPASE, AMYLASE in the last 168 hours. No results for input(s): AMMONIA in the last 168 hours.  CBC: Recent Labs  Lab 12/21/18 0429 12/23/18 0702 12/24/18 0431 12/25/18 0431 Dec 30, 2018 0756  WBC 10.1 11.1* 16.7* 16.3* 31.8*  NEUTROABS 6.8 7.1 12.9*  --  PENDING  HGB 8.7* 7.8* 8.5* 8.2* 8.8*  HCT 28.3* 24.8* 26.5* 26.4* 27.9*  MCV 98.6 97.3 94.0 96.7 95.5  PLT 118* 107* 121* 145* 148*    Cardiac Enzymes: No results for input(s): CKTOTAL, CKMB, CKMBINDEX, TROPONINI in the last 168 hours.  BNP: Invalid input(s): POCBNP  CBG: Recent Labs  Lab 12-30-18 0359 2018/12/30 0426 12-30-18 0530 12-30-18 0613 Dec 30, 2018 0748  GLUCAP 52* 17* 239* 74* 151*    Microbiology: Results for orders placed or performed during the hospital encounter of 12/22/2018  Blood Culture (routine x 2)     Status: None   Collection Time: 01/01/2019  9:20 PM   Specimen: BLOOD  Result Value Ref Range Status   Specimen Description BLOOD RIGHT HAND  Final   Special Requests   Final    BOTTLES DRAWN AEROBIC AND ANAEROBIC Blood Culture adequate volume   Culture   Final    NO GROWTH 5 DAYS Performed at [facility], [address] [place], [place] [address]    Report Status 12/22/2018 FINAL  Final  Blood Culture (  routine x 2)     Status: None   Collection Time: 12/25/2018  9:29 PM   Specimen: BLOOD  Result Value Ref Range Status   Specimen Description BLOOD RIGHT ANTECUBITAL  Final   Special Requests   Final    BOTTLES DRAWN AEROBIC AND ANAEROBIC Blood Culture adequate volume   Culture   Final    NO GROWTH 5 DAYS Performed at [facility], [address] [place], [place] [address]    Report Status 12/22/2018 FINAL  Final  Respiratory Panel by RT PCR (Flu A&B, Covid) -     Status: None   Collection Time: 12/15/2018 11:41 PM  Result Value Ref Range Status   SARS Coronavirus 2 by RT PCR NEGATIVE NEGATIVE Final    Comment: (NOTE) SARS-CoV-2 target nucleic  acids are NOT DETECTED. The SARS-CoV-2 RNA is generally detectable in upper respiratoy specimens during the acute phase of infection. The lowest concentration of SARS-CoV-2 viral copies this assay can detect is 131 copies/mL. A negative result does not preclude SARS-Cov-2 infection and should not be used as the sole basis for treatment or other patient management decisions. A negative result may occur with  improper specimen collection/handling, submission of specimen other than nasopharyngeal swab, presence of viral mutation(s) within the areas targeted by this assay, and inadequate number of viral copies (<131 copies/mL). A negative result must be combined with clinical observations, patient history, and epidemiological information. The expected result is Negative. Fact Sheet for Patients:  PinkCheek.be Fact Sheet for Healthcare Providers:  GravelBags.it This test is not yet ap proved or cleared by the [place] FDA and  has been authorized for detection and/or diagnosis of SARS-CoV-2 by FDA under an Emergency Use Authorization (EUA). This EUA will remain  in effect (meaning this test can be used) for the duration of the COVID-19 declaration under Section 564(b)(1) of the Act, 21 U.S.C. section 360bbb-3(b)(1), unless the authorization is terminated or revoked sooner.    Influenza A by PCR NEGATIVE NEGATIVE Final   Influenza B by PCR NEGATIVE NEGATIVE Final    Comment: (NOTE) The Xpert Xpress SARS-CoV-2/FLU/RSV assay is intended as an aid in  the diagnosis of influenza from Nasopharyngeal swab specimens and  should not be used as a sole basis for treatment. Nasal washings and  aspirates are unacceptable for Xpert Xpress SARS-CoV-2/FLU/RSV  testing. Fact Sheet for Patients: PinkCheek.be Fact Sheet for Healthcare Providers: GravelBags.it This test is not yet  approved or cleared by the [place] FDA and  has been authorized for detection and/or diagnosis of SARS-CoV-2 by  FDA under an Emergency Use Authorization (EUA). This EUA will remain  in effect (meaning this test can be used) for the duration of the  Covid-19 declaration under Section 564(b)(1) of the Act, 21  U.S.C. section 360bbb-3(b)(1), unless the authorization is  terminated or revoked. Performed at [facility], [address] [place], [place] [address]   Urine culture     Status: None   Collection Time: 12/18/18  4:45 AM   Specimen: Urine, Random  Result Value Ref Range Status   Specimen Description   Final    URINE, RANDOM Performed at [facility], [address], [place], [place] [address]    Special Requests   Final    NONE Performed at [facility], [address] [place],  [address]    Culture   Final    NO GROWTH Performed at [facility], [place] [address],  [place], [place] [address]    Report Status 12/19/2018 FINAL  Final  CULTURE, BLOOD (ROUTINE X 2) w Reflex to ID Panel     Status: None   Collection Time: 12/18/18  8:40 AM   Specimen: BLOOD  Result Value Ref Range Status   Specimen Description BLOOD BLOOD RIGHT FOREARM  Final   Special Requests   Final    BOTTLES DRAWN AEROBIC AND ANAEROBIC Blood Culture results may not be optimal due to an inadequate volume of blood received in culture bottles   Culture   Final    NO GROWTH 5 DAYS Performed at [facility], [address] [place], [place] [address]    Report Status 12/23/2018 FINAL  Final  CULTURE, BLOOD (ROUTINE X 2) w Reflex to ID Panel     Status: None   Collection Time: 12/18/18  9:02 AM   Specimen: BLOOD  Result Value Ref Range Status   Specimen Description BLOOD BLOOD LEFT ARM  Final   Special Requests   Final    BOTTLES DRAWN AEROBIC AND ANAEROBIC Blood Culture adequate volume   Culture   Final    NO GROWTH 5 DAYS Performed at [facility], [address] [place], [place] [address]    Report Status 12/23/2018 FINAL  Final  MRSA PCR Screening     Status: None   Collection Time: 12/18/18  9:14 AM   Specimen: Nasal Mucosa; Nasopharyngeal  Result Value Ref Range Status   MRSA by PCR NEGATIVE NEGATIVE Final    Comment:        The GeneXpert MRSA Assay (FDA approved for NASAL specimens only), is one component of a comprehensive MRSA colonization surveillance program. It is not intended to diagnose MRSA infection nor to guide or monitor treatment for MRSA infections. Performed at [facility], [address] [place], [place] [address]   Culture, respiratory (non-expectorated)     Status: None (Preliminary result)   Collection Time: 12/24/18  8:21 PM   Specimen: Tracheal Aspirate; Respiratory  Result Value Ref Range Status   Specimen Description   Final    TRACHEAL ASPIRATE Performed at [facility], [address], [place], [place] [address]    Special Requests   Final    NONE Performed at [facility], [address] [place], [place] [address]    Gram Stain   Final    ABUNDANT WBC PRESENT, PREDOMINANTLY PMN MODERATE GRAM POSITIVE COCCI IN CLUSTERS RARE YEAST    Culture   Final    CULTURE REINCUBATED FOR BETTER GROWTH Performed at [facility], [place] [address] [place], [place] [address]    Report Status PENDING  Incomplete  CULTURE, BLOOD (ROUTINE X 2) w Reflex to ID Panel     Status: None (Preliminary result)   Collection Time: 12/25/18 11:32 PM   Specimen: BLOOD  Result Value Ref Range Status   Specimen Description BLOOD RIGHT ANTECUBITAL  Final   Special Requests   Final    BOTTLES DRAWN AEROBIC AND ANAEROBIC Blood Culture results may not be optimal due to an excessive volume of blood received in culture bottles   Culture   Final    NO GROWTH < 12 HOURS Performed at [facility], [address] [place], [place] [address]    Report Status  PENDING  Incomplete  CULTURE, BLOOD (ROUTINE X 2) w Reflex to ID Panel     Status: None (Preliminary result)   Collection Time: 12/25/18 11:40  PM   Specimen: BLOOD  Result Value Ref Range Status   Specimen Description BLOOD BLOOD RIGHT HAND  Final   Special Requests   Final    BOTTLES DRAWN AEROBIC AND ANAEROBIC Blood Culture results may not be optimal due to an inadequate volume of blood received in culture bottles   Culture   Final    NO GROWTH < 12 HOURS Performed at [facility], [address] [place], [place] [address]    Report Status PENDING  Incomplete    Coagulation Studies: No results for input(s): LABPROT, INR in the last 72 hours.  Urinalysis: No results for input(s): COLORURINE, LABSPEC, PHURINE, GLUCOSEU, HGBUR, BILIRUBINUR, KETONESUR, PROTEINUR, UROBILINOGEN, NITRITE, LEUKOCYTESUR in the last 72 hours.  Invalid input(s): APPERANCEUR    Imaging: DG Chest Port 1 View  Result Date: 12/25/2018 CLINICAL DATA:  Intubated patient.  Fever and respiratory failure. EXAM: PORTABLE CHEST 1 VIEW COMPARISON:  Single-view of the chest 12/25/2018 12/19/2018. FINDINGS: Endotracheal tube remains in place in good position. Left IJ approach central venous catheter tip is in the superior vena cava, unchanged. NG tube courses into the stomach and below the inferior margin the film. Lungs are clear. Heart size is upper normal. No pneumothorax or pleural effusion. IMPRESSION: Support tubes and lines projecting good position. Lungs are appear clear. Electronically Signed   By: Inge Rise M.D.   On: 12/25/2018 12:03   DG Chest Port 1 View  Result Date: 12/25/2018 CLINICAL DATA:  Respiratory failure. EXAM: PORTABLE CHEST 1 VIEW COMPARISON:  Radiograph 12/19/2018 FINDINGS: Endotracheal tube tip at the thoracic inlet. Enteric tube tip below the diaphragm not included in the field of view. Left internal jugular dialysis catheter unchanged in the mid upper SVC. Cardiomegaly with  equivocal slight improvement. Slight improvement in lung aeration with decreasing patchy bilateral lung opacities. No pneumothorax or pleural effusion. IMPRESSION: 1. Slight improvement in lung aeration with decreasing patchy lung opacities. 2. Cardiomegaly with equivocal improvement. 3. Stable support apparatus. Electronically Signed   By: Keith Rake M.D.   On: 12/25/2018 05:03   DG Abd Portable 2V  Result Date: 12/25/2018 CLINICAL DATA:  Abdominal distension.  NG tube. EXAM: PORTABLE ABDOMEN - 2 VIEW COMPARISON:  Single-view of the abdomen 12/19/2018. FINDINGS: NG tube is in good position with both the tip and side-port in the stomach. The colon is distended with a large volume of gas and stool throughout. Pigtail catheter in the pelvis noted. IMPRESSION: NG tube in good position. Distended colon with a large volume of gas and stool throughout compatible with constipation and/or ileus. Electronically Signed   By: Inge Rise M.D.   On: 12/25/2018 11:51     Medications:   . sodium chloride Stopped (12/20/18 1031)  . dexmedetomidine (PRECEDEX) IV infusion Stopped (12/25/18 1728)  . fentaNYL infusion INTRAVENOUS Stopped (12/25/18 1728)  . morphine 5 mg/hr (12/31/2018 1000)   . sodium chloride   Intravenous Once   sodium chloride, acetaminophen, glycopyrrolate, LORazepam, vecuronium  Assessment/ Plan:  Carlos Obrien is a 85 y.o. white male with bilateral lower extremity edema, nephrolithiasis, BPH, coronary artery disease, degenerative disc disease, diabetes mellitus type 2, chronic diastolic heart failure, GERD, Barrett's esophagus, hyperlipidemia, hypertension, paroxysmal atrial fibrillation, prostate cancer, urethral stricture, who was admitted to [facility] on 12/13/2020for evaluation of significant shortness of breath. Intubated and sedated.  Placed on CRRT on 76/16 Complicated by ileus. Multiorgan failure with acute respiratory failure requiring mechanical ventilation, acute  kidney injury requiring renal replacement therapy,  cardiogenic shock requiring vasopressors, and now with ileus. Overall prognosis is poor.   - Discontinue CRRT - Appreciate critical care and palliative care input.    LOS: 8 Carlos Obrien 01/18/2109:23 AM

## 2019-01-05 NOTE — Progress Notes (Signed)
Palliative: Mr. Klaus is lying quietly in bed.  He remains intubated/ventilated/sedated, but continuous renal replacement therapy has been ceased.  There is no family at bedside at this time.  Conference with bedside nursing staff related to patient condition, needs.  They share that Mrs. Willette has elected for compassionate extubation today at 11 AM.  Symptom management per CCM in place, no additional needs at this time. PMT to shadow for needs.  Plan: Compassionate extubation/comfort measures at 11 AM today when wife arrives.  49 minutes Quinn Axe, NP Palliative Medicine Team Team Phone # [phone] Greater than 50% of this time was spent counseling and coordinating care related to the above assessment and plan.

## 2019-01-05 DEATH — deceased

## 2019-01-23 ENCOUNTER — Ambulatory Visit (INDEPENDENT_AMBULATORY_CARE_PROVIDER_SITE_OTHER): Payer: Medicare Other | Admitting: Vascular Surgery

## 2019-01-23 ENCOUNTER — Encounter (INDEPENDENT_AMBULATORY_CARE_PROVIDER_SITE_OTHER): Payer: Medicare Other
